# Patient Record
Sex: Male | Born: 1950
Health system: Southern US, Community
[De-identification: ages and names within clinical notes are randomized; demographics above are authoritative.]

## PROBLEM LIST (undated history)

## (undated) DIAGNOSIS — K5900 Constipation, unspecified: Secondary | ICD-10-CM

## (undated) DIAGNOSIS — I480 Paroxysmal atrial fibrillation: Secondary | ICD-10-CM

## (undated) DIAGNOSIS — C801 Malignant (primary) neoplasm, unspecified: Secondary | ICD-10-CM

## (undated) DIAGNOSIS — R0600 Dyspnea, unspecified: Secondary | ICD-10-CM

## (undated) DIAGNOSIS — I455 Other specified heart block: Secondary | ICD-10-CM

## (undated) DIAGNOSIS — T7840XA Allergy, unspecified, initial encounter: Secondary | ICD-10-CM

## (undated) DIAGNOSIS — J449 Chronic obstructive pulmonary disease, unspecified: Secondary | ICD-10-CM

## (undated) DIAGNOSIS — I77819 Aortic ectasia, unspecified site: Secondary | ICD-10-CM

## (undated) DIAGNOSIS — I2510 Atherosclerotic heart disease of native coronary artery without angina pectoris: Secondary | ICD-10-CM

## (undated) DIAGNOSIS — I10 Essential (primary) hypertension: Secondary | ICD-10-CM

## (undated) DIAGNOSIS — E7800 Pure hypercholesterolemia, unspecified: Secondary | ICD-10-CM

## (undated) DIAGNOSIS — I319 Disease of pericardium, unspecified: Secondary | ICD-10-CM

## (undated) DIAGNOSIS — J40 Bronchitis, not specified as acute or chronic: Secondary | ICD-10-CM

## (undated) DIAGNOSIS — D649 Anemia, unspecified: Secondary | ICD-10-CM

## (undated) HISTORY — PX: OTHER SURGICAL HISTORY: SHX169

## (undated) HISTORY — DX: Constipation, unspecified: K59.00

## (undated) HISTORY — PX: POLYPECTOMY: SHX149

## (undated) HISTORY — PX: FRACTURE SURGERY: SHX138

## (undated) HISTORY — PX: COLONOSCOPY: SHX174

## (undated) HISTORY — DX: Allergy, unspecified, initial encounter: T78.40XA

## (undated) HISTORY — DX: Bronchitis, not specified as acute or chronic: J40

---

## 1998-04-26 ENCOUNTER — Ambulatory Visit (HOSPITAL_COMMUNITY): Admission: RE | Admit: 1998-04-26 | Discharge: 1998-04-26 | Payer: Self-pay | Admitting: Family Medicine

## 1999-06-04 ENCOUNTER — Encounter: Payer: Self-pay | Admitting: Emergency Medicine

## 1999-06-04 ENCOUNTER — Emergency Department (HOSPITAL_COMMUNITY): Admission: EM | Admit: 1999-06-04 | Discharge: 1999-06-04 | Payer: Self-pay | Admitting: Emergency Medicine

## 2000-03-15 ENCOUNTER — Encounter: Payer: Self-pay | Admitting: Family Medicine

## 2000-03-15 ENCOUNTER — Encounter: Admission: RE | Admit: 2000-03-15 | Discharge: 2000-03-15 | Payer: Self-pay | Admitting: Family Medicine

## 2004-12-19 ENCOUNTER — Ambulatory Visit: Payer: Self-pay | Admitting: Gastroenterology

## 2004-12-26 ENCOUNTER — Ambulatory Visit: Payer: Self-pay | Admitting: Gastroenterology

## 2009-02-26 ENCOUNTER — Encounter: Admission: RE | Admit: 2009-02-26 | Discharge: 2009-02-26 | Payer: Self-pay | Admitting: Family Medicine

## 2010-03-02 IMAGING — CR DG CHEST 2V
2 series · 2 of 2 positions shown · non-contrast
Comparison: None

CLINICAL DATA: Smoking history

CHEST - 2 VIEW

[view not recorded (1 of 2)]
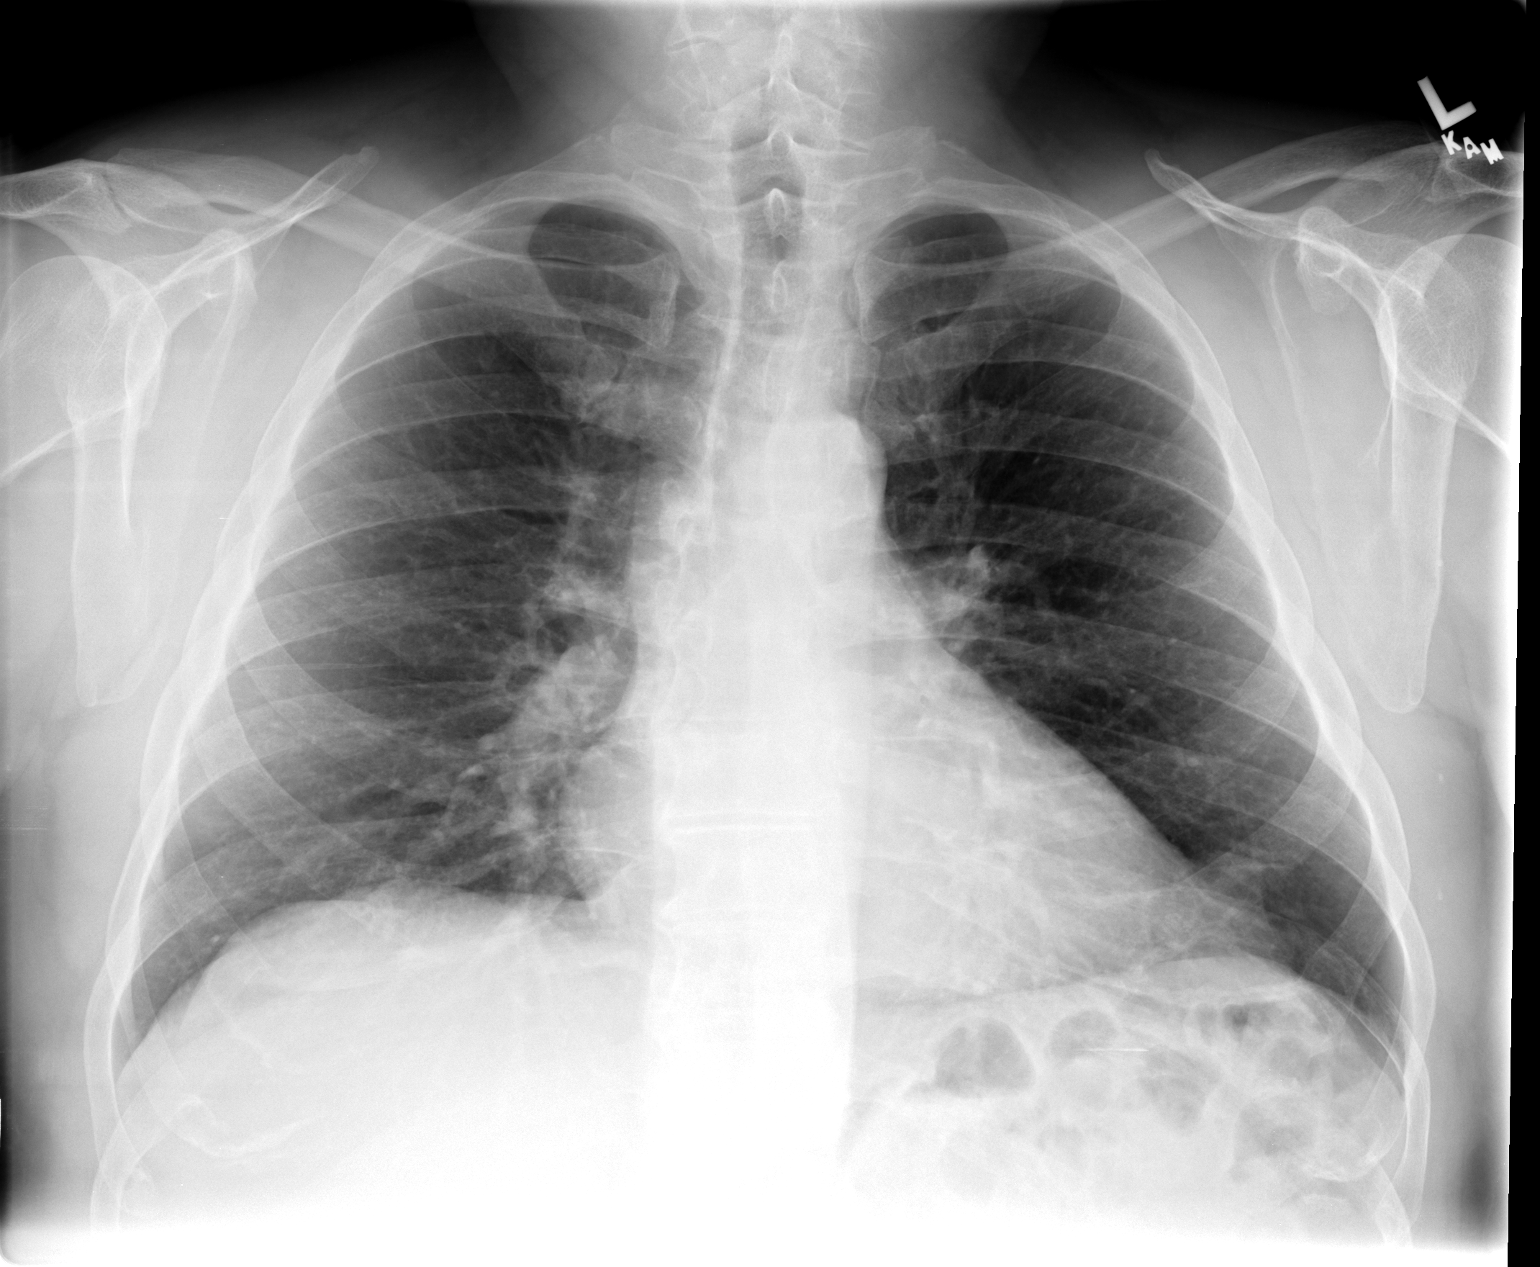

[view not recorded (2 of 2)]
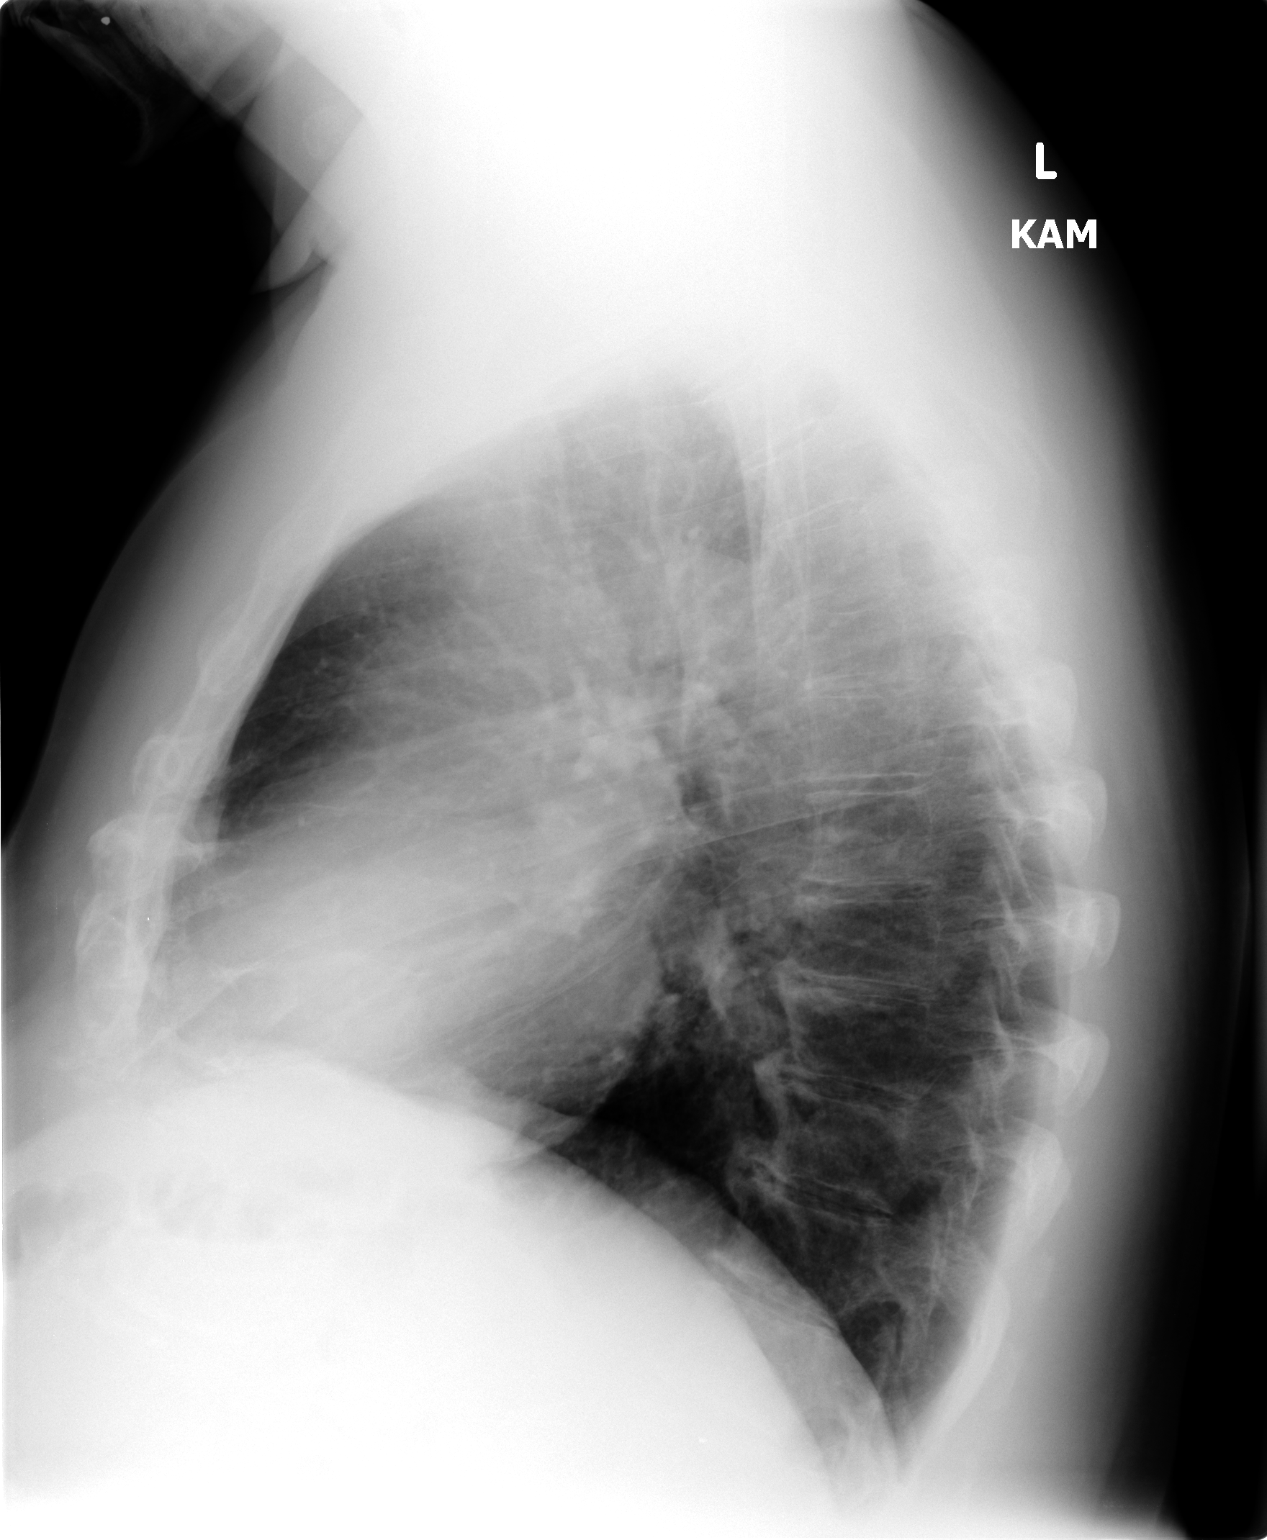

[2 of 2 positions shown; findings below may reference images not displayed]

FINDINGS: No active infiltrate or effusion is seen.  Minimally
prominent perihilar markings are noted, possibly due to smoking
history and mild bronchitis.  The heart is mildly enlarged.  There
are degenerative changes in the thoracic spine.
IMPRESSION: No active lung disease.  Mild peribronchial thickening.

## 2011-12-07 ENCOUNTER — Encounter: Payer: Self-pay | Admitting: Gastroenterology

## 2012-02-15 ENCOUNTER — Encounter: Payer: Self-pay | Admitting: Gastroenterology

## 2012-03-05 ENCOUNTER — Encounter: Payer: Self-pay | Admitting: Gastroenterology

## 2012-05-13 ENCOUNTER — Other Ambulatory Visit: Payer: Self-pay | Admitting: Family Medicine

## 2012-05-13 ENCOUNTER — Ambulatory Visit
Admission: RE | Admit: 2012-05-13 | Discharge: 2012-05-13 | Disposition: A | Discharge status: Home / Self Care | Payer: BC Managed Care – PPO | Source: Ambulatory Visit | Attending: Family Medicine | Admitting: Family Medicine

## 2012-05-13 DIAGNOSIS — R0602 Shortness of breath: Secondary | ICD-10-CM

## 2012-05-19 ENCOUNTER — Observation Stay (HOSPITAL_COMMUNITY)
Admission: EM | Admit: 2012-05-19 | Discharge: 2012-05-21 | Disposition: A | Discharge status: Home / Self Care | Payer: BC Managed Care – PPO | Attending: Internal Medicine | Admitting: Internal Medicine

## 2012-05-19 ENCOUNTER — Encounter (HOSPITAL_COMMUNITY): Payer: Self-pay | Admitting: *Deleted

## 2012-05-19 ENCOUNTER — Emergency Department (HOSPITAL_COMMUNITY): Payer: BC Managed Care – PPO

## 2012-05-19 DIAGNOSIS — Z23 Encounter for immunization: Secondary | ICD-10-CM | POA: Insufficient documentation

## 2012-05-19 DIAGNOSIS — R0602 Shortness of breath: Secondary | ICD-10-CM

## 2012-05-19 DIAGNOSIS — F17200 Nicotine dependence, unspecified, uncomplicated: Secondary | ICD-10-CM | POA: Insufficient documentation

## 2012-05-19 DIAGNOSIS — I10 Essential (primary) hypertension: Secondary | ICD-10-CM

## 2012-05-19 DIAGNOSIS — J9600 Acute respiratory failure, unspecified whether with hypoxia or hypercapnia: Principal | ICD-10-CM | POA: Insufficient documentation

## 2012-05-19 DIAGNOSIS — R059 Cough, unspecified: Secondary | ICD-10-CM

## 2012-05-19 DIAGNOSIS — R05 Cough: Secondary | ICD-10-CM | POA: Diagnosis present

## 2012-05-19 DIAGNOSIS — J441 Chronic obstructive pulmonary disease with (acute) exacerbation: Secondary | ICD-10-CM

## 2012-05-19 DIAGNOSIS — R062 Wheezing: Secondary | ICD-10-CM

## 2012-05-19 DIAGNOSIS — E7800 Pure hypercholesterolemia, unspecified: Secondary | ICD-10-CM | POA: Insufficient documentation

## 2012-05-19 DIAGNOSIS — J449 Chronic obstructive pulmonary disease, unspecified: Secondary | ICD-10-CM | POA: Diagnosis present

## 2012-05-19 HISTORY — DX: Pure hypercholesterolemia, unspecified: E78.00

## 2012-05-19 HISTORY — DX: Chronic obstructive pulmonary disease, unspecified: J44.9

## 2012-05-19 HISTORY — DX: Essential (primary) hypertension: I10

## 2012-05-19 LAB — CBC
HCT: 45.4 % (ref 39.0–52.0)
MCHC: 33.9 g/dL (ref 30.0–36.0)
RDW: 13.6 % (ref 11.5–15.5)
WBC: 16.3 10*3/uL — ABNORMAL HIGH (ref 4.0–10.5)

## 2012-05-19 LAB — BASIC METABOLIC PANEL
BUN: 11 mg/dL (ref 6–23)
Chloride: 101 mEq/L (ref 96–112)
GFR calc Af Amer: >90 mL/min (ref >90)
GFR calc non Af Amer: >90 mL/min (ref >90)
Potassium: 3.9 mEq/L (ref 3.5–5.1)
Sodium: 135 mEq/L (ref 135–145)

## 2012-05-19 MED ORDER — ALBUTEROL SULFATE (5 MG/ML) 0.5% IN NEBU
5.0000 mg | INHALATION_SOLUTION | Freq: Once | RESPIRATORY_TRACT | Status: AC
  Administered 2012-05-19: 5 mg via RESPIRATORY_TRACT
  Filled 2012-05-19: qty 1

## 2012-05-19 MED ORDER — ALBUTEROL SULFATE (5 MG/ML) 0.5% IN NEBU
2.5000 mg | INHALATION_SOLUTION | Freq: Four times a day (QID) | RESPIRATORY_TRACT | Status: DC
  Administered 2012-05-19 – 2012-05-20 (×4): 2.5 mg via RESPIRATORY_TRACT
  Filled 2012-05-19 (×5): qty 0.5

## 2012-05-19 MED ORDER — SODIUM CHLORIDE 0.9 % IJ SOLN
3.0000 mL | Freq: Two times a day (BID) | INTRAMUSCULAR | Status: DC
  Administered 2012-05-20 (×2): 3 mL via INTRAVENOUS

## 2012-05-19 MED ORDER — PROMETHAZINE-CODEINE 6.25-10 MG/5ML PO SYRP
5.0000 mL | ORAL_SOLUTION | Freq: Four times a day (QID) | ORAL | Status: DC | PRN
  Administered 2012-05-19 – 2012-05-20 (×2): 5 mL via ORAL
  Filled 2012-05-19 (×2): qty 5

## 2012-05-19 MED ORDER — DIPHENHYDRAMINE HCL 25 MG PO CAPS
ORAL_CAPSULE | ORAL | Status: AC
  Filled 2012-05-19: qty 1

## 2012-05-19 MED ORDER — ENOXAPARIN SODIUM 40 MG/0.4ML ~~LOC~~ SOLN
40.0000 mg | SUBCUTANEOUS | Status: DC
  Administered 2012-05-19 – 2012-05-20 (×2): 40 mg via SUBCUTANEOUS
  Filled 2012-05-19 (×3): qty 0.4

## 2012-05-19 MED ORDER — SENNOSIDES-DOCUSATE SODIUM 8.6-50 MG PO TABS
1.0000 | ORAL_TABLET | Freq: Every evening | ORAL | Status: DC | PRN
  Administered 2012-05-20: 1 via ORAL
  Filled 2012-05-19 (×2): qty 1

## 2012-05-19 MED ORDER — SODIUM CHLORIDE 0.9 % IV SOLN
250.0000 mL | INTRAVENOUS | Status: DC | PRN
  Administered 2012-05-19: 250 mL via INTRAVENOUS

## 2012-05-19 MED ORDER — SODIUM CHLORIDE 0.9 % IJ SOLN: 3.0000 mL | Freq: Two times a day (BID) | INTRAMUSCULAR | Status: DC

## 2012-05-19 MED ORDER — PNEUMOCOCCAL VAC POLYVALENT 25 MCG/0.5ML IJ INJ
0.5000 mL | INJECTION | INTRAMUSCULAR | Status: AC
  Administered 2012-05-20: 0.5 mL via INTRAMUSCULAR
  Filled 2012-05-19: qty 0.5

## 2012-05-19 MED ORDER — ADULT MULTIVITAMIN W/MINERALS CH
1.0000 | ORAL_TABLET | Freq: Every day | ORAL | Status: DC
  Administered 2012-05-19 – 2012-05-21 (×3): 1 via ORAL
  Filled 2012-05-19 (×3): qty 1

## 2012-05-19 MED ORDER — MOXIFLOXACIN HCL 400 MG PO TABS
400.0000 mg | ORAL_TABLET | Freq: Every day | ORAL | Status: DC
  Administered 2012-05-19 – 2012-05-21 (×3): 400 mg via ORAL
  Filled 2012-05-19 (×3): qty 1

## 2012-05-19 MED ORDER — ALBUTEROL SULFATE (5 MG/ML) 0.5% IN NEBU: 2.5000 mg | INHALATION_SOLUTION | RESPIRATORY_TRACT | Status: DC | PRN

## 2012-05-19 MED ORDER — DIPHENHYDRAMINE HCL 50 MG PO CAPS
ORAL_CAPSULE | ORAL | Status: AC
  Filled 2012-05-19: qty 1

## 2012-05-19 MED ORDER — DIPHENHYDRAMINE HCL 25 MG PO CAPS
25.0000 mg | ORAL_CAPSULE | Freq: Once | ORAL | Status: AC
  Administered 2012-05-19: 25 mg via ORAL

## 2012-05-19 MED ORDER — LISINOPRIL 10 MG PO TABS
10.0000 mg | ORAL_TABLET | Freq: Every day | ORAL | Status: DC
  Administered 2012-05-19 – 2012-05-21 (×3): 10 mg via ORAL
  Filled 2012-05-19 (×3): qty 1

## 2012-05-19 MED ORDER — SODIUM CHLORIDE 0.9 % IJ SOLN: 3.0000 mL | INTRAMUSCULAR | Status: DC | PRN

## 2012-05-19 MED ORDER — METHYLPREDNISOLONE SODIUM SUCC 40 MG IJ SOLR
40.0000 mg | Freq: Two times a day (BID) | INTRAMUSCULAR | Status: DC
  Administered 2012-05-19 – 2012-05-21 (×4): 40 mg via INTRAVENOUS
  Filled 2012-05-19 (×6): qty 1

## 2012-05-19 MED ORDER — ATORVASTATIN CALCIUM 20 MG PO TABS
20.0000 mg | ORAL_TABLET | Freq: Every day | ORAL | Status: DC
  Administered 2012-05-19 – 2012-05-20 (×2): 20 mg via ORAL
  Filled 2012-05-19 (×3): qty 1

## 2012-05-19 MED ORDER — IPRATROPIUM BROMIDE 0.02 % IN SOLN
0.5000 mg | Freq: Four times a day (QID) | RESPIRATORY_TRACT | Status: DC
  Administered 2012-05-19 – 2012-05-20 (×4): 0.5 mg via RESPIRATORY_TRACT
  Filled 2012-05-19 (×5): qty 2.5

## 2012-05-19 MED ORDER — VITAMIN B-1 100 MG PO TABS
100.0000 mg | ORAL_TABLET | Freq: Every day | ORAL | Status: DC
  Administered 2012-05-19 – 2012-05-21 (×3): 100 mg via ORAL
  Filled 2012-05-19 (×3): qty 1

## 2012-05-19 MED ORDER — ONDANSETRON HCL 4 MG PO TABS: 4.0000 mg | ORAL_TABLET | Freq: Four times a day (QID) | ORAL | Status: DC | PRN

## 2012-05-19 MED ORDER — ONDANSETRON HCL 4 MG/2ML IJ SOLN: 4.0000 mg | Freq: Four times a day (QID) | INTRAMUSCULAR | Status: DC | PRN

## 2012-05-19 MED ORDER — VITAMIN E 180 MG (400 UNIT) PO CAPS
400.0000 [IU] | ORAL_CAPSULE | Freq: Every day | ORAL | Status: DC
  Administered 2012-05-19 – 2012-05-21 (×3): 400 [IU] via ORAL
  Filled 2012-05-19 (×3): qty 1

## 2012-05-19 MED ORDER — ASPIRIN 81 MG PO CHEW
81.0000 mg | CHEWABLE_TABLET | Freq: Every day | ORAL | Status: DC
  Administered 2012-05-19 – 2012-05-21 (×3): 81 mg via ORAL
  Filled 2012-05-19 (×3): qty 1

## 2012-05-19 MED ORDER — METHYLPREDNISOLONE SODIUM SUCC 125 MG IJ SOLR
125.0000 mg | Freq: Once | INTRAMUSCULAR | Status: AC
  Administered 2012-05-19: 125 mg via INTRAVENOUS
  Filled 2012-05-19: qty 2

## 2012-05-19 MED ORDER — SODIUM CHLORIDE 0.9 % IV SOLN
INTRAVENOUS | Status: DC
  Administered 2012-05-19: 09:00:00 via INTRAVENOUS

## 2012-05-19 NOTE — H&P (Signed)
Patient's PCP: Darrow Bussing, MD  Chief Complaint: SOB/wheezing  History of Present Illness: Brad [name] is a 61 y.o. white male who Has been sick for over the last week with cough and difficulty breathing, was prescribed antibiotics by his primary care physician and given breathing treatment and a steroid shot with prescription for prednisone taper. He was doing better for a few days and then got worse again. He was evaluated yesterday in the clinic, given another breathing treatment and another steroid shot and started on Avelox this time. He felt better in the clinic, went home and tonight at 3 AM woke up with worsening shortness of breath, wheezing and presents here for evaluation. Started be hypoxic on room air in triage. Patient is a smoker. Symptoms worse with exertion. No chest pain. No fevers. No productive sputum.  Wife has been sick as well.     Meds: Scheduled Meds:    . albuterol  5 mg Nebulization Once  . albuterol  5 mg Nebulization Once  . enoxaparin  40 mg Subcutaneous Q24H  . methylPREDNISolone (SOLU-MEDROL) injection  125 mg Intravenous Once  . sodium chloride  3 mL Intravenous Q12H  . sodium chloride  3 mL Intravenous Q12H   Continuous Infusions:  PRN Meds:.sodium chloride, sodium chloride Allergies: Review of patient's allergies indicates no known allergies. Past Medical History  Diagnosis Date  . COPD (chronic obstructive pulmonary disease)   . Hypertension   . Hypercholesteremia    Past Surgical History  Procedure Date  . Fracture surgery    No family history on file. History   Social History  . Marital Status: Married    Spouse Name: N/A    Number of Children: N/A  . Years of Education: N/A   Occupational History  . Not on file.   Social History Main Topics  . Smoking status: Current Everyday Smoker -- 1.0 packs/day    Types: Cigarettes  . Smokeless tobacco: Not on file  . Alcohol Use: No  . Drug Use:   . Sexually Active:    Other Topics  Concern  . Not on file   Social History Narrative  . No narrative on file   Review of Systems: All systems reviewed with the patient and positive as per history of present illness, otherwise all other systems are negative.   Physical Exam: Blood pressure 148/69, pulse 98, temperature 97.8 F (36.6 C), temperature source Oral, resp. rate 20, SpO2 93.00%. General: Awake, Oriented x3, mild respiratory distress- ruddy complexion HEENT: EOMI, Moist mucous membranes Neck: Supple CV: S1 and S2, rrr Lungs: B/L wheezing Abdomen: Soft, Nontender, Nondistended, +bowel sounds. Ext: Good pulses. Trace edema. No clubbing or cyanosis noted. Neuro: Cranial Nerves II-XII grossly intact. Has 5/5 motor strength in upper and lower extremities.    Lab results:  [facility] 05/19/12 0540  NA 135  K 3.9  CL 101  CO2 23  GLUCOSE 113*  BUN 11  CREATININE 0.70  CALCIUM 9.0  MG --  PHOS --   No results found for this basename: AST:2,ALT:2,ALKPHOS:2,BILITOT:2,PROT:2,ALBUMIN:2 in the last 72 hours No results found for this basename: LIPASE:2,AMYLASE:2 in the last 72 hours  Basename 05/19/12 0540  WBC 16.3*  NEUTROABS --  HGB 15.4  HCT 45.4  MCV 92.8  PLT 195   No results found for this basename: CKTOTAL:3,CKMB:3,CKMBINDEX:3,TROPONINI:3 in the last 72 hours No components found with this basename: POCBNP:3 No results found for this basename: DDIMER in the last 72 hours No results found for this  basename: HGBA1C:2 in the last 72 hours No results found for this basename: CHOL:2,HDL:2,LDLCALC:2,TRIG:2,CHOLHDL:2,LDLDIRECT:2 in the last 72 hours No results found for this basename: TSH,T4TOTAL,FREET3,T3FREE,THYROIDAB in the last 72 hours No results found for this basename: VITAMINB12:2,FOLATE:2,FERRITIN:2,TIBC:2,IRON:2,RETICCTPCT:2 in the last 72 hours Imaging results:  Dg Chest 2 View  05/13/2012  *RADIOLOGY REPORT*  Clinical Data: Shortness of breath.  Cough and history of COPD. Pneumonia several  years ago.  CHEST - 2 VIEW  Comparison: 02/26/2009  Findings: Midline trachea.  Normal heart size and mediastinal contours. No pleural effusion or pneumothorax.  Peribronchial thickening is slightly increased. No lobar consolidation.  IMPRESSION: No lobar consolidation.  Interstitial thickening which is slightly progressive.  Favored to represent chronic bronchitis/the sequelae of smoking.  A component of acute superimposed bronchitis cannot be excluded.  Original Report Authenticated By: Consuello Bossier, M.D.   Dg Chest Portable 1 View  05/19/2012  *RADIOLOGY REPORT*  Clinical Data: Shortness of breath  PORTABLE CHEST - 1 VIEW  Comparison: 05/18/2012 radiograph performed at [facility] @ [facility].  Findings: No interval change.  Peribronchial thickening.  Hazy opacity within the medial right base.  No pneumothorax.  No pleural effusion. Stable cardiomediastinal contours.  Aortic arch atherosclerosis.  No acute osseous change.  Multilevel degenerative change.  IMPRESSION: Bronchitis.  Hazy right lower lobe opacity medially may reflect the same process or superimposed infiltrate.  Original Report Authenticated By: Waneta Martins, M.D.     Assessment & Plan by Problem:   *COPD exacerbation/PNA- nebs, steroids, abx  Acute respiratory failure- O2   SOB (shortness of breath)- O2 PRN   Cough- PRN medications   COPD (chronic obstructive pulmonary disease)   HTN (hypertension)- continue current medications   Time spent on admission, talking to the patient, and coordinating care was: 35 mins.  Srihari Shellhammer, DO 05/19/2012, 8:10 AM

## 2012-05-19 NOTE — ED Notes (Signed)
Pt c/o shortness of breath x 3-4 days. Seen by PCP and rx'd steroids and abx. Started yesterday, worsening starting at 0330 this morning.

## 2012-05-19 NOTE — ED Provider Notes (Signed)
History     CSN: [id number]  Arrival date & time 05/19/12  0454   First MD Initiated Contact with Patient 05/19/12 0505      Chief Complaint  Patient presents with  . Shortness of Breath    (Consider location/radiation/quality/duration/timing/severity/associated sxs/prior treatment) HPI History provided by patient. Has been sick for over the last week with cough and difficulty breathing, was prescribed antibiotics by his primary care physician and given breathing treatment and a steroid shot with prescription for prednisone taper. He was doing better for a few days and then got worse again. He was evaluated yesterday in the clinic, given another breathing treatment and another steroid shot and started on Avelox this time.  He felt better in the clinic, went home and tonight at 3 AM woke up with worsening shortness of breath, wheezing and presents here for evaluation. Started be hypoxic on room air in triage. Patient is a smoker. Symptoms worse with exertion. No chest pain. No fevers. No productive sputum. No sick contacts at home. Symptoms moderate to severe Past Medical History  Diagnosis Date  . COPD (chronic obstructive pulmonary disease)   . Hypertension   . Hypercholesteremia     Past Surgical History  Procedure Date  . Fracture surgery     No family history on file.  History  Substance Use Topics  . Smoking status: Current Everyday Smoker -- 1.0 packs/day    Types: Cigarettes  . Smokeless tobacco: Not on file  . Alcohol Use: No      Review of Systems  Constitutional: Negative for fever and chills.  HENT: Negative for neck pain and neck stiffness.   Eyes: Negative for pain.  Respiratory: Positive for cough, shortness of breath and wheezing.   Cardiovascular: Negative for chest pain.  Gastrointestinal: Negative for abdominal pain.  Genitourinary: Negative for dysuria.  Musculoskeletal: Negative for back pain.  Skin: Negative for rash.  Neurological: Negative for  headaches.  All other systems reviewed and are negative.    Allergies  Review of patient's allergies indicates no known allergies.  Home Medications   Current Outpatient Rx  Name Route Sig Dispense Refill  . ALBUTEROL SULFATE HFA 108 (90 BASE) MCG/ACT IN AERS Inhalation Inhale 2 puffs into the lungs every 6 (six) hours as needed. shortness of breath    . ASPIRIN 81 MG PO CHEW Oral Chew 81 mg by mouth daily.    . CRESTOR 10 MG PO TABS Oral Take 10 mg by mouth daily.     Marland Kitchen GINKOBA PO Oral Take by mouth.    Marland Kitchen LISINOPRIL 10 MG PO TABS Oral Take 10 mg by mouth daily.    Marland Kitchen MOXIFLOXACIN HCL 400 MG PO TABS Oral Take 400 mg by mouth daily.    . ADULT MULTIVITAMIN W/MINERALS CH Oral Take 1 tablet by mouth daily.    Marland Kitchen PREDNISONE 20 MG PO TABS Oral Take 20 mg by mouth daily. Take 3 tablets faily for tw days 2 tablets daily for four days then stop.    Marland Kitchen VITAMIN B-1 100 MG PO TABS Oral Take 100 mg by mouth daily.    Marland Kitchen TIOTROPIUM BROMIDE MONOHYDRATE 18 MCG IN CAPS Inhalation Place 18 mcg into inhaler and inhale daily.    Marland Kitchen VITAMIN E 400 UNITS PO CAPS Oral Take 400 Units by mouth daily.      BP 155/86  Pulse 86  Temp 97.8 F (36.6 C) (Oral)  Resp 24  SpO2 94%  Physical Exam  Constitutional: He  is oriented to person, place, and time. He appears well-developed and well-nourished.  HENT:  Head: Normocephalic and atraumatic.  Eyes: Conjunctivae and EOM are normal. Pupils are equal, round, and reactive to light.  Neck: Trachea normal. Neck supple. No thyromegaly present.  Cardiovascular: Normal rate, regular rhythm, S1 normal, S2 normal and normal pulses.     No systolic murmur is present   No diastolic murmur is present  Pulses:      Radial pulses are 2+ on the right side, and 2+ on the left side.  Pulmonary/Chest: He has no rhonchi. He exhibits no tenderness.       Mild respiratory distress with tachypnea. Decreased bilateral breath sounds with prolonged expirations and bilateral expiratory  wheezes  Abdominal: Soft. Normal appearance and bowel sounds are normal. There is no tenderness. There is no CVA tenderness and negative Murphy's sign.  Musculoskeletal:       BLE:s Calves nontender, no cords or erythema, negative Homans sign  Neurological: He is alert and oriented to person, place, and time. He has normal strength. No cranial nerve deficit or sensory deficit. GCS eye subscore is 4. GCS verbal subscore is 5. GCS motor subscore is 6.  Skin: Skin is warm and dry. No rash noted. He is not diaphoretic.  Psychiatric: His speech is normal.       Cooperative and appropriate    ED Course  Procedures (including critical care time)  Results for orders placed during the hospital encounter of 05/19/12  CBC      Component Value Range   WBC 16.3 (*) 4.0 - 10.5 K/uL   RBC 4.89  4.22 - 5.81 MIL/uL   Hemoglobin 15.4  13.0 - 17.0 g/dL   HCT 14.7  82.9 - 56.2 %   MCV 92.8  78.0 - 100.0 fL   MCH 31.5  26.0 - 34.0 pg   MCHC 33.9  30.0 - 36.0 g/dL   RDW 13.0  86.5 - 78.4 %   Platelets 195  150 - 400 K/uL  BASIC METABOLIC PANEL      Component Value Range   Sodium 135  135 - 145 mEq/L   Potassium 3.9  3.5 - 5.1 mEq/L   Chloride 101  96 - 112 mEq/L   CO2 23  19 - 32 mEq/L   Glucose, Bld 113 (*) 70 - 99 mg/dL   BUN 11  6 - 23 mg/dL   Creatinine, Ser 6.96  0.50 - 1.35 mg/dL   Calcium 9.0  8.4 - 29.5 mg/dL   GFR calc non Af Amer >90  >90 mL/min   GFR calc Af Amer >90  >90 mL/min   Dg Chest 2 View   Dg Chest Portable 1 View  05/19/2012  *RADIOLOGY REPORT*  Clinical Data: Shortness of breath  PORTABLE CHEST - 1 VIEW  Comparison: 05/18/2012 radiograph performed at [facility] @ [facility].  Findings: No interval change.  Peribronchial thickening.  Hazy opacity within the medial right base.  No pneumothorax.  No pleural effusion. Stable cardiomediastinal contours.  Aortic arch atherosclerosis.  No acute osseous change.  Multilevel degenerative change.  IMPRESSION:  Bronchitis.  Hazy right lower lobe opacity medially may reflect the same process or superimposed infiltrate.  Original Report Authenticated By: Waneta Martins, M.D.   Hypoxic on room air requiring oxygen, improved with 2 L nasal cannula   Date: 05/19/2012  Rate: 90  Rhythm: normal sinus rhythm  QRS Axis: normal  Intervals: normal  ST/T Wave abnormalities:  nonspecific ST changes  Conduction Disutrbances:none  Narrative Interpretation:   Old EKG Reviewed: none available  IV Solu-Medrol. Albuterol treatment provided. On recheck minimal improvement with continued oxygen requirement.  Labs and x-ray obtained and reviewed as above. IV Avelox initiated. Medicine consult for admission.  6:44 AM case discussed as above with Dr. Houston Siren who agrees to admission. MDM   Hypoxia, bronchitis and right lower lobe infiltrate. IV antibiotics. Continued breathing treatments provided. Plan medical admission.         Sunnie Nielsen, MD 05/19/12 [phone]

## 2012-05-19 NOTE — ED Notes (Signed)
Report called to [facility] and pt transported on stretcher, monitored with O2 at 3L to Rm 1417

## 2012-05-20 DIAGNOSIS — I10 Essential (primary) hypertension: Secondary | ICD-10-CM

## 2012-05-20 DIAGNOSIS — R059 Cough, unspecified: Secondary | ICD-10-CM

## 2012-05-20 DIAGNOSIS — J441 Chronic obstructive pulmonary disease with (acute) exacerbation: Secondary | ICD-10-CM

## 2012-05-20 DIAGNOSIS — R0602 Shortness of breath: Secondary | ICD-10-CM

## 2012-05-20 DIAGNOSIS — R05 Cough: Secondary | ICD-10-CM

## 2012-05-20 LAB — BASIC METABOLIC PANEL
CO2: 25 mEq/L (ref 19–32)
GFR calc non Af Amer: >90 mL/min (ref >90)
Glucose, Bld: 149 mg/dL — ABNORMAL HIGH (ref 70–99)
Potassium: 4.3 mEq/L (ref 3.5–5.1)
Sodium: 138 mEq/L (ref 135–145)

## 2012-05-20 LAB — CBC
Hemoglobin: 14.9 g/dL (ref 13.0–17.0)
MCH: 31 pg (ref 26.0–34.0)
MCV: 94.4 fL (ref 78.0–100.0)
Platelets: 210 10*3/uL (ref 150–400)
RBC: 4.8 MIL/uL (ref 4.22–5.81)
WBC: 14.5 10*3/uL — ABNORMAL HIGH (ref 4.0–10.5)

## 2012-05-20 MED ORDER — IPRATROPIUM BROMIDE 0.02 % IN SOLN
0.5000 mg | RESPIRATORY_TRACT | Status: DC
  Administered 2012-05-20 – 2012-05-21 (×6): 0.5 mg via RESPIRATORY_TRACT
  Filled 2012-05-20 (×6): qty 2.5

## 2012-05-20 MED ORDER — ALBUTEROL SULFATE (5 MG/ML) 0.5% IN NEBU
2.5000 mg | INHALATION_SOLUTION | RESPIRATORY_TRACT | Status: DC
  Administered 2012-05-20 – 2012-05-21 (×6): 2.5 mg via RESPIRATORY_TRACT
  Filled 2012-05-20 (×6): qty 0.5

## 2012-05-20 MED ORDER — ACETAMINOPHEN 325 MG PO TABS: 650.0000 mg | ORAL_TABLET | Freq: Four times a day (QID) | ORAL | Status: DC | PRN

## 2012-05-20 NOTE — Progress Notes (Signed)
Pt was walked on RA without 02 and his sats dropped to 88%. While on 02 @ 2L/min [place] his sats are 91%. Will continue to monitor and leave 02 on for now.

## 2012-05-20 NOTE — Progress Notes (Signed)
[facility] PROGRESS NOTE  Brad [name]:[id number] DOB: August 16, 1951 DOA: 05/19/2012 PCP: Darrow Bussing, MD  Brief narrative: Pleasant 61 yo hx COPD, HTN admitted 05/19/12 with COPD exacerbation and acute resp. Failure having failed OP treatment. Given 02, solumedrol and nebs with antibiotic. Improving  Consultants:    Procedures:    Antibiotics:  Avelox day #2.   HPI/Subjective: Sitting up in bed watching TV. Reports "breathing much better". Denies CP/discomfort. NAD  Objective: Filed Vitals:   05/19/12 1539 05/19/12 2126 05/20/12 0237 05/20/12 0801  BP:      Pulse:  85    Temp:      TempSrc:      Resp:  20 20   Height:      Weight:      SpO2: 97% 95%  94%    Intake/Output Summary (Last 24 hours) at 05/20/12 0852 Last data filed at 05/20/12 0600  Gross per 24 hour  Intake 1796.25 ml  Output    925 ml  Net 871.25 ml    Exam:   General:  Awake alert oriented x3  Cardiovascular: RRR, No MGR, trace LEE, PPP  Respiratory: mild increased work of breathing with conversation. Breath sounds tight, diminished in bases, diffuse inspiratory and expiratory wheezes, mild rhonchi.   Abdomen: obese, soft, +BS, non-tender to palp  Data Reviewed: Basic Metabolic Panel:  Lab 05/20/12 6295 05/19/12 0540  NA 138 135  K 4.3 3.9  CL 104 101  CO2 25 23  GLUCOSE 149* 113*  BUN 13 11  CREATININE 0.75 0.70  CALCIUM 9.1 9.0  MG -- --  PHOS -- --   Liver Function Tests: No results found for this basename: AST:5,ALT:5,ALKPHOS:5,BILITOT:5,PROT:5,ALBUMIN:5 in the last 168 hours No results found for this basename: LIPASE:5,AMYLASE:5 in the last 168 hours No results found for this basename: AMMONIA:5 in the last 168 hours CBC:  Lab 05/20/12 0418 05/19/12 0540  WBC 14.5* 16.3*  NEUTROABS -- --  HGB 14.9 15.4  HCT 45.3 45.4  MCV 94.4 92.8  PLT 210 195   Cardiac Enzymes: No results found for this basename: CKTOTAL:5,CKMB:5,CKMBINDEX:5,TROPONINI:5 in the last  168 hours BNP: No components found with this basename: POCBNP:5 CBG: No results found for this basename: GLUCAP:5 in the last 168 hours  No results found for this or any previous visit (from the past 240 hour(s)).   Studies: Dg Chest Portable 1 View  05/19/2012  *RADIOLOGY REPORT*  Clinical Data: Shortness of breath  PORTABLE CHEST - 1 VIEW  Comparison: 05/18/2012 radiograph performed at [facility] @ [facility].  Findings: No interval change.  Peribronchial thickening.  Hazy opacity within the medial right base.  No pneumothorax.  No pleural effusion. Stable cardiomediastinal contours.  Aortic arch atherosclerosis.  No acute osseous change.  Multilevel degenerative change.  IMPRESSION: Bronchitis.  Hazy right lower lobe opacity medially may reflect the same process or superimposed infiltrate.  Original Report Authenticated By: Waneta Martins, M.D.    Scheduled Meds:   . albuterol  2.5 mg Nebulization Q4H  . aspirin  81 mg Oral Daily  . atorvastatin  20 mg Oral q1800  . diphenhydrAMINE      . diphenhydrAMINE  25 mg Oral Once  . enoxaparin  40 mg Subcutaneous Q24H  . ipratropium  0.5 mg Nebulization Q4H  . lisinopril  10 mg Oral Daily  . methylPREDNISolone (SOLU-MEDROL) injection  40 mg Intravenous Q12H  . moxifloxacin  400 mg Oral Daily  . multivitamin with minerals  1  tablet Oral Daily  . pneumococcal 23 valent vaccine  0.5 mL Intramuscular Tomorrow-1000  . sodium chloride  3 mL Intravenous Q12H  . thiamine  100 mg Oral Daily  . vitamin E  400 Units Oral Daily  . DISCONTD: albuterol  2.5 mg Nebulization Q6H  . DISCONTD: ipratropium  0.5 mg Nebulization Q6H  . DISCONTD: sodium chloride  3 mL Intravenous Q12H  . DISCONTD: sodium chloride  3 mL Intravenous Q12H   Continuous Infusions:   . DISCONTD: sodium chloride 75 mL/hr at 05/20/12 0600     Assessment/Plan:.  *COPD exacerbation/PNA- some improvement but remains sob with coarse BS/wheezes. Will continue   Nebs q 4hr,  Will keep steroids IV 1 more day then change to po taper, continue  abx #2.   Acute respiratory failure- Improved. Sats remain 92-97 on 3-4L O2 . Will ambulate and check sats and attempt to wean 02 if appropriate  SOB (shortness of breath)- O2 PRN. See #1&#2.   Cough- PRN medications . Improved.  COPD (chronic obstructive pulmonary disease) See #1.  HTN (hypertension)- Fair control.  Continue current medications and monitor     Code Status: full Family Communication:  Disposition Plan:  Home when medically ready likely in am.    Gwenyth Bender, MD  [facility] Pager [phone]  If 7PM-7AM, please contact night-coverage [email or web address] Password [email or web address] 05/20/2012, 8:52 AM   LOS: 1 day

## 2012-05-20 NOTE — Progress Notes (Signed)
Patient seen and examined by me.  Agree with plan by [name].  Still with some dec BS b/L. Continue IV steroids/PO abx/nebs and recheck O2 in AM.  Will hopefully not require O2 at discharge.  Marlin Canary

## 2012-05-21 LAB — CBC
HCT: 45.2 % (ref 39.0–52.0)
Hemoglobin: 14.6 g/dL (ref 13.0–17.0)
MCV: 95.2 fL (ref 78.0–100.0)
RDW: 13.6 % (ref 11.5–15.5)
WBC: 13.7 10*3/uL — ABNORMAL HIGH (ref 4.0–10.5)

## 2012-05-21 MED ORDER — MOXIFLOXACIN HCL 400 MG PO TABS: 400.0000 mg | ORAL_TABLET | Freq: Every day | ORAL | Status: DC

## 2012-05-21 MED ORDER — PREDNISONE 10 MG PO TABS: ORAL_TABLET | ORAL | Status: DC

## 2012-05-21 MED ORDER — MOXIFLOXACIN HCL 400 MG PO TABS: ORAL_TABLET | ORAL | Status: DC

## 2012-05-21 NOTE — Discharge Summary (Signed)
Patient seen and examined by me.  Agree with plan by [name].  Failed outpatient treatment of COPD exacerbation- better here.  Encouraged tobacco cessation.  Hope on abx and steroid taper.  Not requiring O2.  Marlin Canary DO

## 2012-05-21 NOTE — Discharge Instructions (Addendum)
Bronchitis Bronchitis is a problem of the air tubes leading to your lungs. This problem makes it hard for air to get in and out of the lungs. You may cough a lot because your air tubes are narrow. Going without care can cause lasting (chronic) bronchitis. HOME CARE   Drink enough fluids to keep your pee (urine) clear or pale yellow.   Use a cool mist humidifier.   Quit smoking if you smoke. If you keep smoking, the bronchitis might not get better.   Only take medicine as told by your doctor.  GET HELP RIGHT AWAY IF:   Coughing keeps you awake.   You start to wheeze.   You become more sick or weak.   You have a hard time breathing or get short of breath.   You cough up blood.   Coughing lasts more than 2 weeks.   You have a fever.   Your baby is older than 3 months with a rectal temperature of 102 F (38.9 C) or higher.   Your baby is 40 months old or younger with a rectal temperature of 100.4 F (38 C) or higher.  MAKE SURE YOU:  Understand these instructions.   Will watch your condition.   Will get help right away if you are not doing well or get worse.  Document Released: 04/24/2008 Document Revised: 10/26/2011 Document Reviewed: 10/08/2009 [facility] Patient Information 2012 [place], [place].Chronic Obstructive Pulmonary Disease Chronic obstructive pulmonary disease (COPD) is a lung disease. The lungs become damaged, making it hard to get air in and out of your lungs. The damage to your lungs cannot be changed.  HOME CARE  Stop smoking if you smoke. Avoid secondhand smoke.   Only take medicine as told by your doctor.   Talk to your doctor about using cough syrup or over-the-counter medicines.   Drink enough fluids to keep your pee (urine) clear or pale yellow.   Use a humidifier or vaporizer. This may help loosen the thick spit (mucus).   Talk to your doctor about vaccines that help prevent other lung problems (pneumonia and flu vaccines).   Use home oxygen as  told by your doctor.   Stay active and exercise.   Eat healthy foods.  GET HELP RIGHT AWAY IF:   Your heart is beating fast.   You become disturbed, confused, shake, or are dazed.   You have trouble breathing.   You have chest pain.   You have a fever.   You cough up thick spit that is yellowish-white or green.   Your breathing becomes worse when you exercise.   You are running out of the medicine you take for your breathing.  MAKE SURE YOU:   Understand these instructions.   Will watch your condition.   Will get help right away if you are not doing well or get worse.  Document Released: 04/24/2008 Document Revised: 10/26/2011 Document Reviewed: 01/06/2011 [facility] Patient Information 2012 [place], [place].

## 2012-05-21 NOTE — Progress Notes (Signed)
Patients oxygen saturation was 93% on room air at rest. On ambulation, patients oxygen saturation was 92% on room air. Patient did not complain of shortness of breath and tolerated well. Will continue to monitor patient.

## 2012-05-21 NOTE — Progress Notes (Signed)
On room air at rest, patients oxygen saturation was 95%. On ambulation >100 feet, patients saturation was 93% on room air. Patient did not complain of shortness of breath. Patient tolerated well. Will continue to monitor patient. Setzer, Don Broach

## 2012-05-21 NOTE — Discharge Summary (Signed)
Physician Discharge Summary  Patient ID: Brad [name] MRN: [id number] DOB/AGE: 62-10-1951 62 y.o.  Admit date: 05/19/2012 Discharge date: 05/21/2012  Primary Care Physician:  Darrow Bussing, MD   Discharge Diagnoses:    Principal Problem:  *COPD exacerbation Active Problems:  SOB (shortness of breath)  Cough  COPD (chronic obstructive pulmonary disease)  HTN (hypertension)   Medication List  As of 05/21/2012  8:15 AM   TAKE these medications         albuterol 108 (90 BASE) MCG/ACT inhaler   Commonly known as: PROVENTIL HFA;VENTOLIN HFA   Inhale 2 puffs into the lungs every 6 (six) hours as needed. shortness of breath      aspirin 81 MG chewable tablet   Chew 81 mg by mouth daily.      CRESTOR 10 MG tablet   Generic drug: rosuvastatin   Take 10 mg by mouth daily.      GINKOBA PO   Take by mouth.      lisinopril 10 MG tablet   Commonly known as: PRINIVIL,ZESTRIL   Take 10 mg by mouth daily.      moxifloxacin 400 MG tablet   Commonly known as: AVELOX   Last dose 05/23/12      multivitamin with minerals Tabs   Take 1 tablet by mouth daily.      predniSONE 10 MG tablet   Commonly known as: DELTASONE   Take 6 tabs 7/3, take 4 tabs 7/4, take 2 tabs 7/5 take 1 tab 7/6 then stop      thiamine 100 MG tablet   Commonly known as: VITAMIN B-1   Take 100 mg by mouth daily.      tiotropium 18 MCG inhalation capsule   Commonly known as: SPIRIVA   Place 18 mcg into inhaler and inhale daily.      vitamin E 400 UNIT capsule   Take 400 Units by mouth daily.             Disposition and Follow-up: Pt medically stable and ready for discharge to home.   Follow-up Information    Follow up with Darrow Bussing, MD. Schedule an appointment as soon as possible for a visit in 1 week.   Contact information:   [address] [place] [address] [phone]          Consults:  None  Physical exam: General: Awake alert oriented x3 Sitting up in chair.  NAD Cardiovascular: RRR, No MGR, trace LEE, PPP  Respiratory: Normal effort. BS diminished in bilateral bases, but clear otherwise. No wheeze, rhonchi  Abdomen: obese, soft, +BS, non-tender to palp      Significant Diagnostic Studies:  Dg Chest Portable 1 View  05/19/2012  *RADIOLOGY REPORT*  Clinical Data: Shortness of breath  PORTABLE CHEST - 1 VIEW  Comparison: 05/18/2012 radiograph performed at [facility] @ [facility].  Findings: No interval change.  Peribronchial thickening.  Hazy opacity within the medial right base.  No pneumothorax.  No pleural effusion. Stable cardiomediastinal contours.  Aortic arch atherosclerosis.  No acute osseous change.  Multilevel degenerative change.  IMPRESSION: Bronchitis.  Hazy right lower lobe opacity medially may reflect the same process or superimposed infiltrate.  Original Report Authenticated By: Waneta Martins, M.D.    Labs Reviewed  CBC - Abnormal; Notable for the following:    WBC 16.3 (*)     All other components within normal limits  BASIC METABOLIC PANEL - Abnormal; Notable for the following:  Glucose, Bld 113 (*)     All other components within normal limits  BASIC METABOLIC PANEL - Abnormal; Notable for the following:    Glucose, Bld 149 (*)     All other components within normal limits  CBC - Abnormal; Notable for the following:    WBC 14.5 (*)     All other components within normal limits  CBC - Abnormal; Notable for the following:    WBC 13.7 (*)     All other components within normal limits        Dg Chest 2 View  05/13/2012  *RADIOLOGY REPORT*  Clinical Data: Shortness of breath.  Cough and history of COPD. Pneumonia several years ago.  CHEST - 2 VIEW  Comparison: 02/26/2009  Findings: Midline trachea.  Normal heart size and mediastinal contours. No pleural effusion or pneumothorax.  Peribronchial thickening is slightly increased. No lobar consolidation.  IMPRESSION: No lobar consolidation.  Interstitial  thickening which is slightly progressive.  Favored to represent chronic bronchitis/the sequelae of smoking.  A component of acute superimposed bronchitis cannot be excluded.  Original Report Authenticated By: Consuello Bossier, M.D.   Dg Chest Portable 1 View  05/19/2012  *RADIOLOGY REPORT*  Clinical Data: Shortness of breath  PORTABLE CHEST - 1 VIEW  Comparison: 05/18/2012 radiograph performed at [facility] @ [facility].  Findings: No interval change.  Peribronchial thickening.  Hazy opacity within the medial right base.  No pneumothorax.  No pleural effusion. Stable cardiomediastinal contours.  Aortic arch atherosclerosis.  No acute osseous change.  Multilevel degenerative change.  IMPRESSION: Bronchitis.  Hazy right lower lobe opacity medially may reflect the same process or superimposed infiltrate.  Original Report Authenticated By: Waneta Martins, M.D.       Brief H and P: For complete details please refer to admission H and P, but in brief  Brad [name] is a 61 y.o. white male who Has been sick for over a week with cough and difficulty breathing, was prescribed antibiotics by his primary care physician and given breathing treatment and a steroid shot with prescription for prednisone taper. He was doing better for a few days and then got worse again. He was evaluated 6/29 in the clinic, given another breathing treatment and another steroid shot and started on Avelox this time. He felt better in the clinic, went home and on 05/19/12 at 3 AM woke up with worsening shortness of breath, wheezing and presented to ED for evaluation.  In ED he was hypoxic on room air in triage. Patient is a smoker. Symptoms worse with exertion. No chest pain. No fevers. No productive sputum. Wife has been sick as well.  Was admitted by hospitalist      Hospital Course:   Principal Problem:  *COPD exacerbation Active Problems:  SOB (shortness of breath)  Cough  COPD (chronic obstructive  pulmonary disease)  HTN (hypertension)  COPD exacerbation/PNA-  Pt admitted to tele. Had some improvement 05/20/12 but remained sob with coarse BS/wheezes. Continued Nebs q 4hr,and IV  Steroids as well as avelox. On day of discharge, much improved. Wheeze resolved. Will discharge on prednisone taper and 2 days of avelox to complete 7 days including doses taken before admission.    Acute respiratory failure-  On 05/20/12 sats 88% on RA with exertion. He initially required 3-4L 02 to keep sats >90. On day of discharge on room air at rest, patients oxygen saturation was 95%. On ambulation >100 feet, patients saturation was 93% on  room air. Patient did not complain of shortness of breath. Patient tolerated well. Will follow up with PCP 1 week for recheck of sat level. May benefit from OP pulmonary function testing.    SOB (shortness of breath)- O2 PRN. See #1&#2.   Cough- PRN medications . Improved.   COPD (chronic obstructive pulmonary disease) See #1. Continue home meds  HTN (hypertension)-  Controlled. Continue home meds.    Time spent on Discharge: 30 min  Signed: Gwenyth Bender 05/21/2012, 8:15 AM

## 2012-05-21 NOTE — Care Management Note (Signed)
    Page 1 of 1   05/21/2012     12:27:16 PM   CARE MANAGEMENT NOTE 05/21/2012  Patient:  Brad [name], Brad [name]   Account Number:  [id number]  Date Initiated:  05/21/2012  Documentation initiated by:  [facility]  Subjective/Objective Assessment:   ADMITTED W/SOB.     Action/Plan:   FROM HOME   Anticipated DC Date:  05/21/2012   Anticipated DC Plan:  HOME/SELF CARE      DC Planning Services  CM consult      Choice offered to / List presented to:             Status of service:  Completed, signed off Medicare Important Message given?   (If response is "NO", the following Medicare IM given date fields will be blank) Date Medicare IM given:   Date Additional Medicare IM given:    Discharge Disposition:  HOME/SELF CARE  Per UR Regulation:  Reviewed for med. necessity/level of care/duration of stay  If discussed at Long Length of Stay Meetings, dates discussed:    Comments:

## 2012-06-13 ENCOUNTER — Ambulatory Visit (INDEPENDENT_AMBULATORY_CARE_PROVIDER_SITE_OTHER): Payer: BC Managed Care – PPO | Admitting: Internal Medicine

## 2012-06-13 DIAGNOSIS — J449 Chronic obstructive pulmonary disease, unspecified: Secondary | ICD-10-CM

## 2012-06-13 DIAGNOSIS — J4489 Other specified chronic obstructive pulmonary disease: Secondary | ICD-10-CM

## 2012-06-13 LAB — PULMONARY FUNCTION TEST

## 2012-06-13 NOTE — Progress Notes (Signed)
PFT done today. 

## 2014-06-09 ENCOUNTER — Ambulatory Visit
Admission: RE | Admit: 2014-06-09 | Discharge: 2014-06-09 | Disposition: A | Discharge status: Home / Self Care | Payer: BC Managed Care – PPO | Source: Ambulatory Visit | Attending: Family Medicine | Admitting: Family Medicine

## 2014-06-09 ENCOUNTER — Other Ambulatory Visit: Payer: Self-pay | Admitting: Family Medicine

## 2014-06-09 DIAGNOSIS — J441 Chronic obstructive pulmonary disease with (acute) exacerbation: Secondary | ICD-10-CM

## 2014-12-16 ENCOUNTER — Encounter: Payer: Self-pay | Admitting: Gastroenterology

## 2015-01-12 ENCOUNTER — Ambulatory Visit (AMBULATORY_SURGERY_CENTER): Payer: Self-pay

## 2015-01-12 VITALS — Ht 68.0 in | Wt 231.2 lb

## 2015-01-12 DIAGNOSIS — Z86010 Personal history of colonic polyps: Secondary | ICD-10-CM

## 2015-01-12 MED ORDER — NA SULFATE-K SULFATE-MG SULF 17.5-3.13-1.6 GM/177ML PO SOLN: ORAL | Status: DC

## 2015-01-12 NOTE — Progress Notes (Signed)
Per pt, no allergies to soy or egg products.Pt not taking any weight loss meds or using  O2 at home. 

## 2015-01-26 ENCOUNTER — Ambulatory Visit (AMBULATORY_SURGERY_CENTER): Payer: BLUE CROSS/BLUE SHIELD | Admitting: Gastroenterology

## 2015-01-26 ENCOUNTER — Encounter: Payer: Self-pay | Admitting: Gastroenterology

## 2015-01-26 VITALS — BP 137/63 | HR 80 | Temp 98.2°F | Resp 20 | Ht 68.0 in | Wt 231.0 lb

## 2015-01-26 DIAGNOSIS — D120 Benign neoplasm of cecum: Secondary | ICD-10-CM | POA: Diagnosis not present

## 2015-01-26 DIAGNOSIS — D123 Benign neoplasm of transverse colon: Secondary | ICD-10-CM

## 2015-01-26 DIAGNOSIS — Z86010 Personal history of colonic polyps: Secondary | ICD-10-CM

## 2015-01-26 DIAGNOSIS — D124 Benign neoplasm of descending colon: Secondary | ICD-10-CM

## 2015-01-26 DIAGNOSIS — K5730 Diverticulosis of large intestine without perforation or abscess without bleeding: Secondary | ICD-10-CM

## 2015-01-26 MED ORDER — FLEET ENEMA 7-19 GM/118ML RE ENEM
1.0000 | ENEMA | Freq: Once | RECTAL | Status: AC
  Administered 2015-01-26: 1 via RECTAL

## 2015-01-26 MED ORDER — SODIUM CHLORIDE 0.9 % IV SOLN: 500.0000 mL | INTRAVENOUS | Status: DC

## 2015-01-26 NOTE — Progress Notes (Signed)
Called to room to assist during endoscopic procedure.  Patient ID and intended procedure confirmed with present staff. Received instructions for my participation in the procedure from the performing physician.  

## 2015-01-26 NOTE — Op Note (Signed)
[facility]  [address] [place], [id number]   COLONOSCOPY PROCEDURE REPORT  PATIENT: Brad [name], Brad [name]  MR#: [id number] BIRTHDATE: 02-23-1951 , 13  yrs. old GENDER: male ENDOSCOPIST: Inda Castle, MD REFERRED BY: PROCEDURE DATE:  01/26/2015 PROCEDURE:   Colonoscopy with snare polypectomy and Colonoscopy, screening First Screening Colonoscopy - Avg.  risk and is 50 yrs.  old or older - No.  Prior Negative Screening - Now for repeat screening. 10 or more years since last screening  History of Adenoma - Now for follow-up colonoscopy & has been > or = to 3 yrs.  N/A  Polyps Removed Today? Yes. ASA CLASS:   Class II INDICATIONS:Average Risk. MEDICATIONS: Monitored anesthesia care and Propofol 500 mg IV  DESCRIPTION OF PROCEDURE:   After the risks benefits and alternatives of the procedure were thoroughly explained, informed consent was obtained.  The digital rectal exam revealed no abnormalities of the rectum.   The [id number]  endoscope was introduced through the anus and advanced to the cecum, which was identified by both the appendix and ileocecal valve. No adverse events experienced.   The quality of the prep was (Suprep was used) good.  The instrument was then slowly withdrawn as the colon was fully examined.      COLON FINDINGS: There was moderate diverticulosis noted in the descending colon.   A sessile polyp measuring 15 mm in size was found in the transverse colon.  A polypectomy was performed with a cold snare.  The resection was complete, the polyp tissue was completely retrieved and sent to histology.   A sessile polyp measuring 2 mm in size was found in the transverse colon.  A polypectomy was performed with cold forceps.   A sessile polyp measuring 18 mm in size was found at the cecum.  A polypectomy was performed using snare cautery.  The resection was complete, the polyp tissue was completely retrieved and sent to histology.    A sessile polyp measuring 15 mm in size was found at the splenic flexure.  A polypectomy was performed with a cold snare.  The resection was complete, the polyp tissue was completely retrieved and sent to histology.   A polypoid shaped sessile polyp measuring 18 mm in size was found at the cecum.  A polypectomy was performed using snare cautery.  The resection was complete, the polyp tissue was completely retrieved and sent to histology.  Retroflexed views revealed no abnormalities. The time to cecum = 6.5 Withdrawal time = 34.4   The scope was withdrawn and the procedure completed. COMPLICATIONS: There were no immediate complications.  ENDOSCOPIC IMPRESSION: 1.  colonic polyposis 2.  Diverticulosis  RECOMMENDATIONS: 1.  Colonoscopy 3 years 2.  Hold Aspirin and all other NSAIDS for 2 weeks.  eSigned:  Inda Castle, MD 01/26/2015 3:48 PM   cc: Lujean Amel, MD   PATIENT NAME:  Brad [name], Brad [name] MR#: [id number]

## 2015-01-26 NOTE — Progress Notes (Signed)
Pt asked if he is ok to stay on ASA 81 mg daily; ok per Dr. Deatra Ina

## 2015-01-26 NOTE — Patient Instructions (Addendum)
YOU HAD AN ENDOSCOPIC PROCEDURE TODAY AT [facility]:   Refer to the procedure report that was given to you for any specific questions about what was found during the examination.  If the procedure report does not answer your questions, please call your gastroenterologist to clarify.  If you requested that your care partner not be given the details of your procedure findings, then the procedure report has been included in a sealed envelope for you to review at your convenience later.  YOU SHOULD EXPECT: Some feelings of bloating in the abdomen. Passage of more gas than usual.  Walking can help get rid of the air that was put into your GI tract during the procedure and reduce the bloating. If you had a lower endoscopy (such as a colonoscopy or flexible sigmoidoscopy) you may notice spotting of blood in your stool or on the toilet paper. If you underwent a bowel prep for your procedure, you may not have a normal bowel movement for a few days.  Please Note:  You might notice some irritation and congestion in your nose or some drainage.  This is from the oxygen used during your procedure.  There is no need for concern and it should clear up in a day or so.  SYMPTOMS TO REPORT IMMEDIATELY:   Following lower endoscopy (colonoscopy or flexible sigmoidoscopy):  Excessive amounts of blood in the stool  Significant tenderness or worsening of abdominal pains  Swelling of the abdomen that is new, acute  Fever of 100F or higher  For urgent or emergent issues, a gastroenterologist can be reached at any hour by calling [phone].   DIET: Your first meal following the procedure should be a small meal and then it is ok to progress to your normal diet. Heavy or fried foods are harder to digest and may make you feel nauseous or bloated.  Likewise, meals heavy in dairy and vegetables can increase bloating.  Drink plenty of fluids but you should avoid alcoholic beverages for 24  hours.  ACTIVITY:  You should plan to take it easy for the rest of today and you should NOT DRIVE or use heavy machinery until tomorrow (because of the sedation medicines used during the test).    FOLLOW UP: Our staff will call the number listed on your records the next business day following your procedure to check on you and address any questions or concerns that you may have regarding the information given to you following your procedure. If we do not reach you, we will leave a message.  However, if you are feeling well and you are not experiencing any problems, there is no need to return our call.  We will assume that you have returned to your regular daily activities without incident.  If any biopsies were taken you will be contacted by phone or by letter within the next 1-3 weeks.  Please call us at [phone] if you have not heard about the biopsies in 3 weeks.    SIGNATURES/CONFIDENTIALITY: You and/or your care partner have signed paperwork which will be entered into your electronic medical record.  These signatures attest to the fact that that the information above on your After Visit Summary has been reviewed and is understood.  Full responsibility of the confidentiality of this discharge information lies with you and/or your care-partner.  Please read over handouts about polyps, diverticulosis and high fiber diets  No Aspirin, Aspirin containing products (BC or Goody powders) or NSAIDs (Ibuprofen, Advil,  Aleve, Motrin) for 2 weeks- Tylenol is ok  Await pathology

## 2015-01-26 NOTE — Progress Notes (Signed)
Report to PACU, RN, vss, BBS= Clear.  

## 2015-01-27 ENCOUNTER — Telehealth: Payer: Self-pay | Admitting: *Deleted

## 2015-01-27 NOTE — Telephone Encounter (Signed)
Name identifier, left message, follow-up 

## 2015-02-04 ENCOUNTER — Encounter: Payer: Self-pay | Admitting: Gastroenterology

## 2015-06-13 IMAGING — CR DG CHEST 2V
2 series · 2 of 2 positions shown · non-contrast
Comparison: Portable chest x-ray of 05/19/2012

CLINICAL DATA: Shortness of breath, dry cough, former smoking
history

EXAM:
CHEST  2 VIEW

[w chest pa]
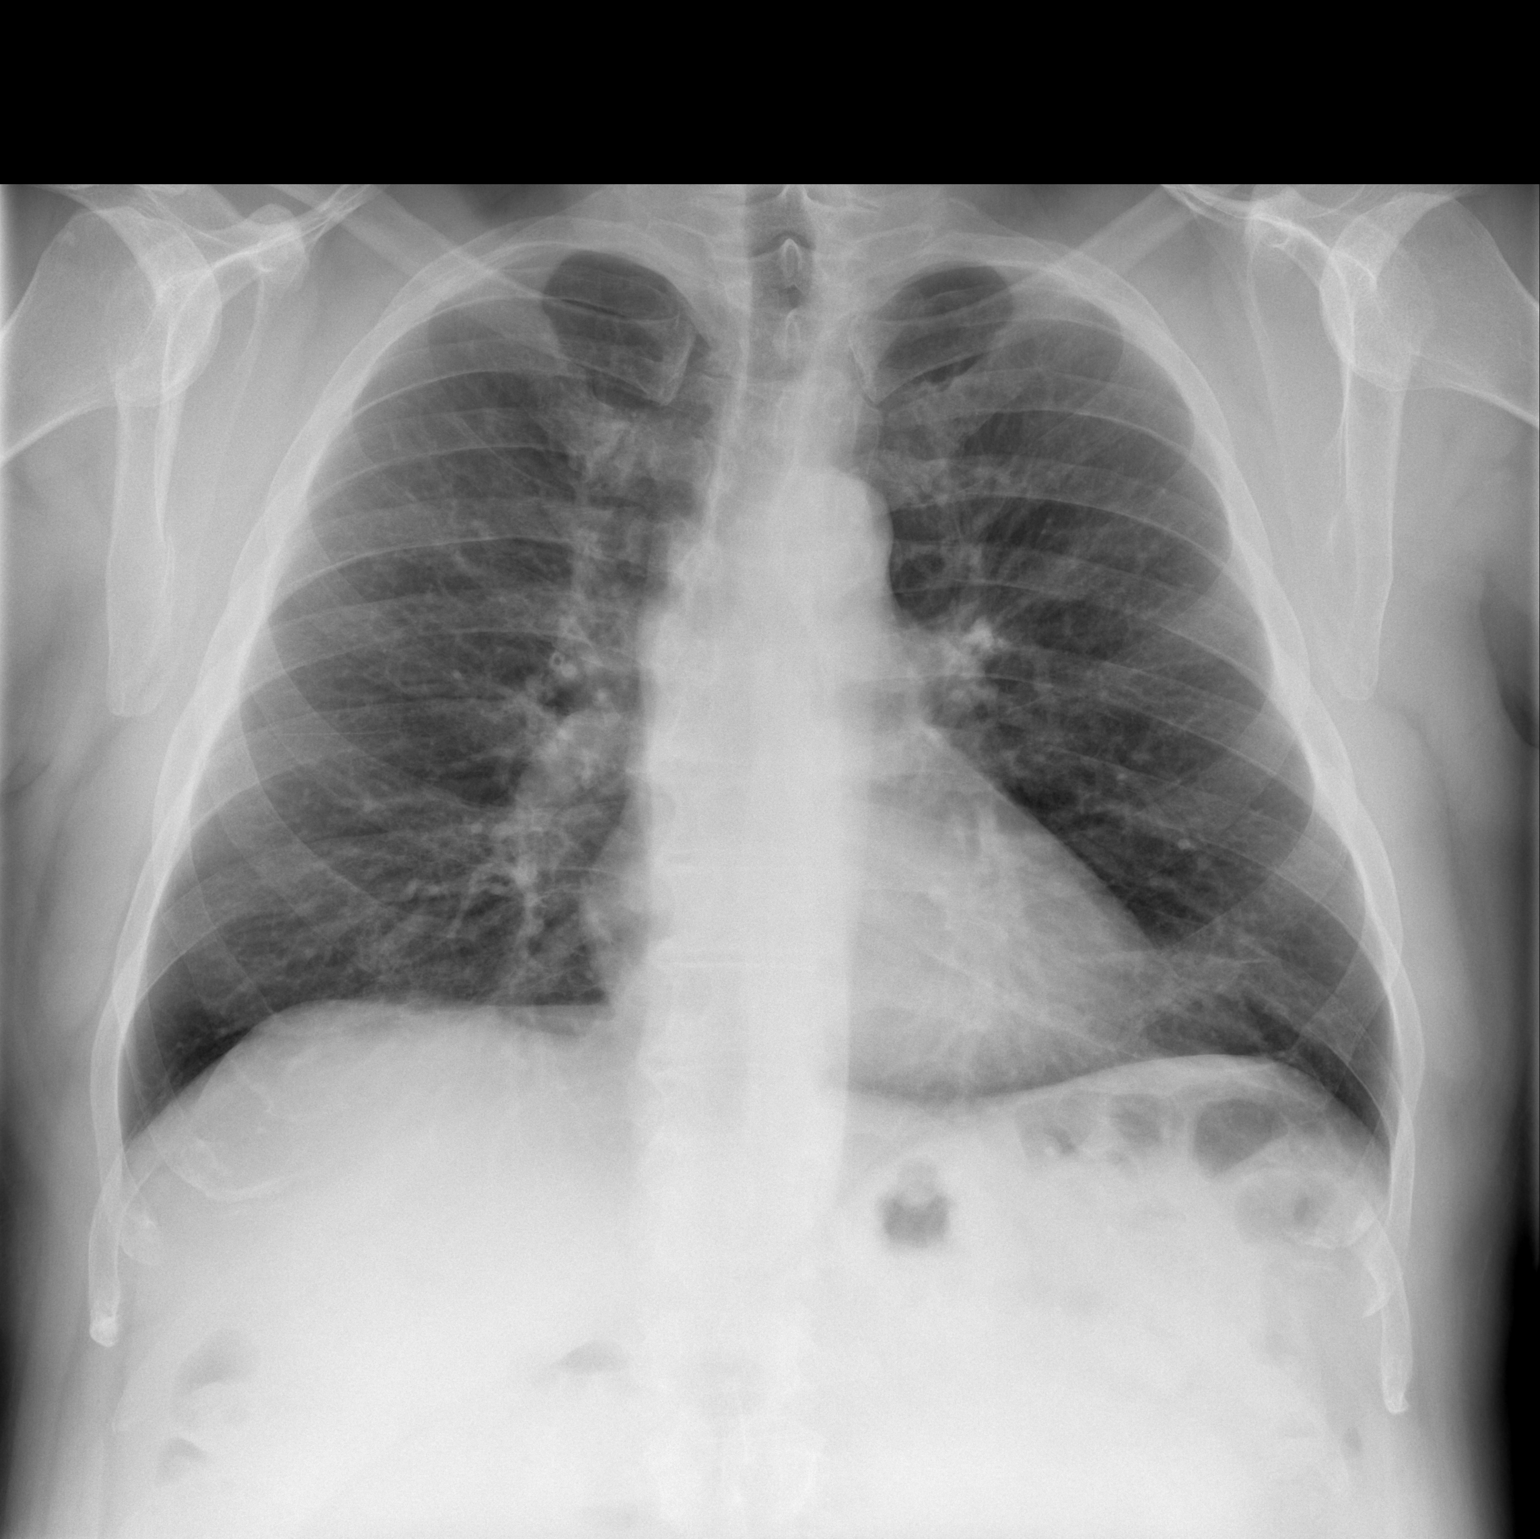

[w chest lat]
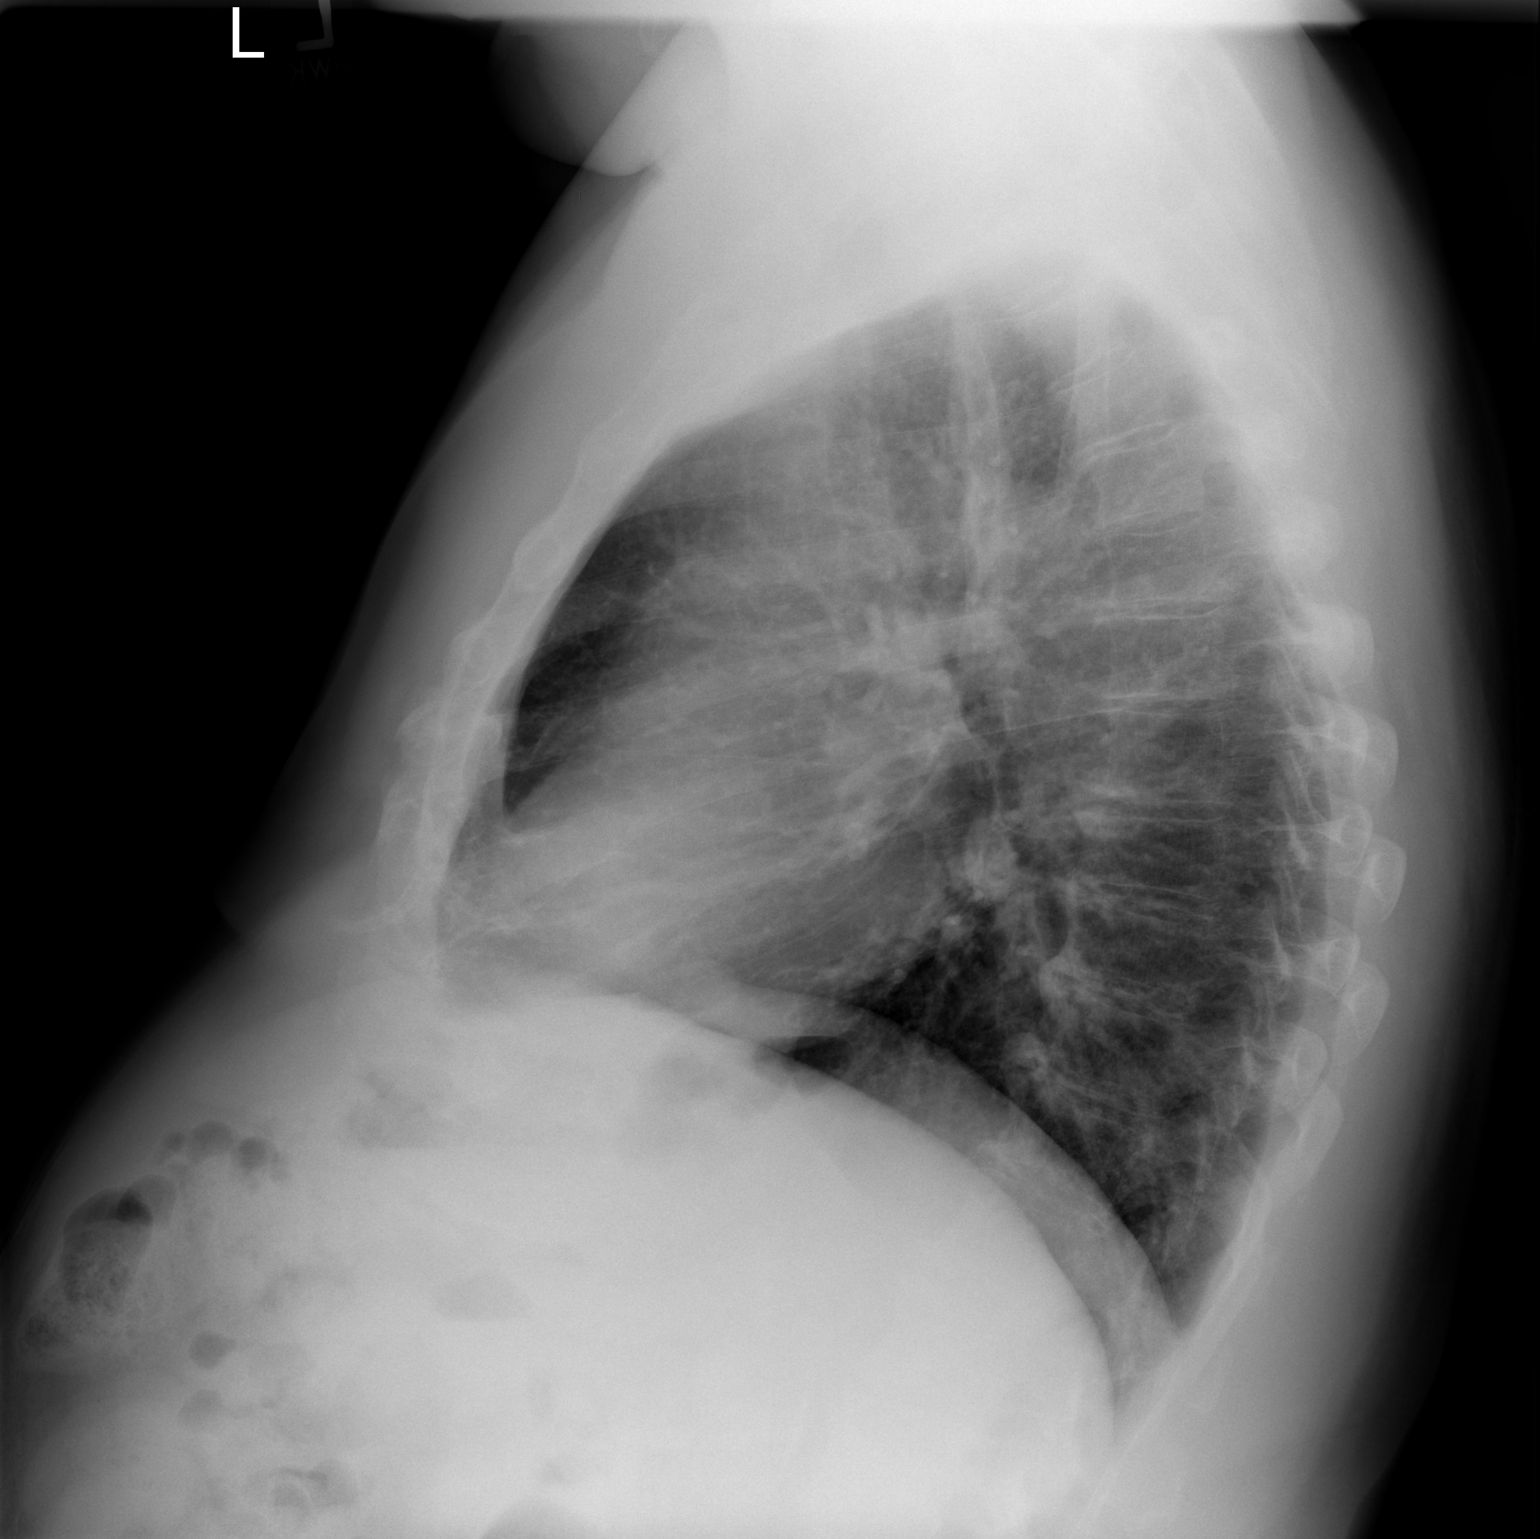

[2 of 2 positions shown; findings below may reference images not displayed]

FINDINGS: No focal infiltrate or effusion is seen. There is peribronchial
thickening which may indicate bronchitis, possibly chronic.
Mediastinal and hilar contours are unremarkable. The heart is within
normal limits in size. There are degenerative changes throughout the
thoracic spine.
IMPRESSION: Peribronchial thickening consistent with bronchitis. No definite
active process.

## 2016-03-06 DIAGNOSIS — R740 Nonspecific elevation of levels of transaminase and lactic acid dehydrogenase [LDH]: Secondary | ICD-10-CM | POA: Diagnosis not present

## 2017-01-02 DIAGNOSIS — R7301 Impaired fasting glucose: Secondary | ICD-10-CM | POA: Diagnosis not present

## 2017-01-02 DIAGNOSIS — I10 Essential (primary) hypertension: Secondary | ICD-10-CM | POA: Diagnosis not present

## 2017-01-02 DIAGNOSIS — E291 Testicular hypofunction: Secondary | ICD-10-CM | POA: Diagnosis not present

## 2017-05-15 DIAGNOSIS — R7301 Impaired fasting glucose: Secondary | ICD-10-CM | POA: Diagnosis not present

## 2017-05-15 DIAGNOSIS — Z23 Encounter for immunization: Secondary | ICD-10-CM | POA: Diagnosis not present

## 2017-05-15 DIAGNOSIS — I10 Essential (primary) hypertension: Secondary | ICD-10-CM | POA: Diagnosis not present

## 2017-05-15 DIAGNOSIS — J449 Chronic obstructive pulmonary disease, unspecified: Secondary | ICD-10-CM | POA: Diagnosis not present

## 2017-05-15 DIAGNOSIS — E291 Testicular hypofunction: Secondary | ICD-10-CM | POA: Diagnosis not present

## 2017-05-15 DIAGNOSIS — Z0000 Encounter for general adult medical examination without abnormal findings: Secondary | ICD-10-CM | POA: Diagnosis not present

## 2017-07-06 DIAGNOSIS — E291 Testicular hypofunction: Secondary | ICD-10-CM | POA: Diagnosis not present

## 2017-09-19 DIAGNOSIS — Z23 Encounter for immunization: Secondary | ICD-10-CM | POA: Diagnosis not present

## 2017-10-10 DIAGNOSIS — L989 Disorder of the skin and subcutaneous tissue, unspecified: Secondary | ICD-10-CM | POA: Diagnosis not present

## 2017-10-16 DIAGNOSIS — C44319 Basal cell carcinoma of skin of other parts of face: Secondary | ICD-10-CM | POA: Diagnosis not present

## 2017-11-14 DIAGNOSIS — C4401 Basal cell carcinoma of skin of lip: Secondary | ICD-10-CM | POA: Diagnosis not present

## 2017-11-19 DIAGNOSIS — R7301 Impaired fasting glucose: Secondary | ICD-10-CM | POA: Diagnosis not present

## 2017-11-19 DIAGNOSIS — E291 Testicular hypofunction: Secondary | ICD-10-CM | POA: Diagnosis not present

## 2017-11-19 DIAGNOSIS — J449 Chronic obstructive pulmonary disease, unspecified: Secondary | ICD-10-CM | POA: Diagnosis not present

## 2017-11-19 DIAGNOSIS — I10 Essential (primary) hypertension: Secondary | ICD-10-CM | POA: Diagnosis not present

## 2017-11-21 DIAGNOSIS — Z4802 Encounter for removal of sutures: Secondary | ICD-10-CM | POA: Diagnosis not present

## 2018-01-26 ENCOUNTER — Encounter: Payer: Self-pay | Admitting: Gastroenterology

## 2018-05-28 DIAGNOSIS — E291 Testicular hypofunction: Secondary | ICD-10-CM | POA: Diagnosis not present

## 2018-05-28 DIAGNOSIS — Z125 Encounter for screening for malignant neoplasm of prostate: Secondary | ICD-10-CM | POA: Diagnosis not present

## 2018-05-28 DIAGNOSIS — J449 Chronic obstructive pulmonary disease, unspecified: Secondary | ICD-10-CM | POA: Diagnosis not present

## 2018-05-28 DIAGNOSIS — R7301 Impaired fasting glucose: Secondary | ICD-10-CM | POA: Diagnosis not present

## 2018-05-28 DIAGNOSIS — I10 Essential (primary) hypertension: Secondary | ICD-10-CM | POA: Diagnosis not present

## 2018-05-28 DIAGNOSIS — Z0000 Encounter for general adult medical examination without abnormal findings: Secondary | ICD-10-CM | POA: Diagnosis not present

## 2018-05-28 DIAGNOSIS — Z23 Encounter for immunization: Secondary | ICD-10-CM | POA: Diagnosis not present

## 2018-06-21 ENCOUNTER — Encounter: Payer: Self-pay | Admitting: Gastroenterology

## 2018-08-05 ENCOUNTER — Ambulatory Visit (AMBULATORY_SURGERY_CENTER): Payer: Self-pay

## 2018-08-05 ENCOUNTER — Encounter: Payer: Self-pay | Admitting: Gastroenterology

## 2018-08-05 VITALS — Ht 68.0 in | Wt 216.8 lb

## 2018-08-05 DIAGNOSIS — Z86010 Personal history of colonic polyps: Secondary | ICD-10-CM

## 2018-08-05 MED ORDER — NA SULFATE-K SULFATE-MG SULF 17.5-3.13-1.6 GM/177ML PO SOLN: 1.0000 | Freq: Once | ORAL | 0 refills | Status: AC

## 2018-08-05 NOTE — Progress Notes (Signed)
Denies allergies to eggs or soy products. Denies complication of anesthesia or sedation. Denies use of weight loss medication. Denies use of O2.   Emmi instructions declined.  

## 2018-08-19 ENCOUNTER — Ambulatory Visit (AMBULATORY_SURGERY_CENTER): Payer: BLUE CROSS/BLUE SHIELD | Admitting: Gastroenterology

## 2018-08-19 ENCOUNTER — Encounter: Payer: Self-pay | Admitting: Gastroenterology

## 2018-08-19 VITALS — BP 124/67 | HR 65 | Temp 98.6°F | Resp 14 | Ht 68.0 in | Wt 231.0 lb

## 2018-08-19 DIAGNOSIS — D123 Benign neoplasm of transverse colon: Secondary | ICD-10-CM

## 2018-08-19 DIAGNOSIS — Z1211 Encounter for screening for malignant neoplasm of colon: Secondary | ICD-10-CM | POA: Diagnosis not present

## 2018-08-19 DIAGNOSIS — D122 Benign neoplasm of ascending colon: Secondary | ICD-10-CM

## 2018-08-19 DIAGNOSIS — Z86010 Personal history of colonic polyps: Secondary | ICD-10-CM | POA: Diagnosis not present

## 2018-08-19 DIAGNOSIS — D128 Benign neoplasm of rectum: Secondary | ICD-10-CM

## 2018-08-19 MED ORDER — SODIUM CHLORIDE 0.9 % IV SOLN: 500.0000 mL | Freq: Once | INTRAVENOUS | Status: DC

## 2018-08-19 NOTE — Progress Notes (Signed)
To PACU, vss. Report to [email or web address] 

## 2018-08-19 NOTE — Op Note (Signed)
[facility] Patient Name: Brad [name] Procedure Date: 08/19/2018 11:38 AM MRN: [id number] Endoscopist: Remo Lipps P. Havery Moros , MD Age: 67 Referring MD:  Date of Birth: 1951/11/10 Gender: Male Account #: [id number] Procedure:                Colonoscopy Indications:              Surveillance: Personal history of adenomatous                            polyps on last colonoscopy 3 years ago Medicines:                Monitored Anesthesia Care Procedure:                Pre-Anesthesia Assessment:                           - Prior to the procedure, a History and Physical                            was performed, and patient medications and                            allergies were reviewed. The patient's tolerance of                            previous anesthesia was also reviewed. The risks                            and benefits of the procedure and the sedation                            options and risks were discussed with the patient.                            All questions were answered, and informed consent                            was obtained. Prior Anticoagulants: The patient has                            taken no previous anticoagulant or antiplatelet                            agents. ASA Grade Assessment: II - A patient with                            mild systemic disease. After reviewing the risks                            and benefits, the patient was deemed in                            satisfactory condition to undergo the procedure.  After obtaining informed consent, the colonoscope                            was passed under direct vision. Throughout the                            procedure, the patient's blood pressure, pulse, and                            oxygen saturations were monitored continuously. The                            Colonoscope was introduced through the anus and                            advanced to the the  cecum, identified by                            appendiceal orifice and ileocecal valve. The                            colonoscopy was performed without difficulty. The                            patient tolerated the procedure well. The quality                            of the bowel preparation was adequate. The                            ileocecal valve, appendiceal orifice, and rectum                            were photographed. Scope In: 11:48:22 AM Scope Out: 12:25:20 PM Total Procedure Duration: 0 hours 36 minutes 58 seconds  Findings:                 The perianal and digital rectal examinations were                            normal.                           A diminutive polyp was found in the ascending                            colon. The polyp was sessile. The polyp was removed                            with a cold snare. Resection and retrieval were                            complete.  A 5 mm polyp was found in the hepatic flexure. The                            polyp was sessile. The polyp was removed with a                            cold snare. Resection and retrieval were complete.                           Eight sessile polyps were found in the transverse                            colon. The polyps were 2 to 6 mm in size. These                            polyps were removed with a cold snare. Resection                            and retrieval were complete.                           A 10 mm polyp was found in the transverse colon.                            The polyp was flat. The polyp was removed with a                            cold snare. Resection and retrieval were complete.                           A 5 mm polyp was found in the rectum. The polyp was                            sessile. The polyp was removed with a cold snare.                            Resection and retrieval were complete.                           Many  medium-mouthed diverticula were found in the                            left colon.                           The colon was redundant.                           Internal hemorrhoids were found during retroflexion.                           The exam was otherwise without abnormality. Complications:  No immediate complications. Estimated blood loss:                            Minimal. Estimated Blood Loss:     Estimated blood loss was minimal. Impression:               - One diminutive polyp in the ascending colon,                            removed with a cold snare. Resected and retrieved.                           - One 5 mm polyp at the hepatic flexure, removed                            with a cold snare. Resected and retrieved.                           - Eight 2 to 6 mm polyps in the transverse colon,                            removed with a cold snare. Resected and retrieved.                           - One 10 mm polyp in the transverse colon, removed                            with a cold snare. Resected and retrieved.                           - One 5 mm polyp in the rectum, removed with a cold                            snare. Resected and retrieved.                           - Diverticulosis in the left colon.                           - Redundant colon.                           - Internal hemorrhoids.                           - The examination was otherwise normal. Recommendation:           - Patient has a contact number available for                            emergencies. The signs and symptoms of potential                            delayed complications were discussed with the  patient. Return to normal activities tomorrow.                            Written discharge instructions were provided to the                            patient.                           - Resume previous diet.                           - Continue present  medications.                           - Await pathology results.                           - Repeat colonoscopy for surveillance based on                            pathology results. Remo Lipps P. Jovonta Levit, MD 08/19/2018 12:31:32 PM This report has been signed electronically.

## 2018-08-19 NOTE — Patient Instructions (Signed)
Discharge instructions given. Handouts on polyps,diverticulosis and hemorrhoids. Resume previous medications. YOU HAD AN ENDOSCOPIC PROCEDURE TODAY AT THE [facility]:   Refer to the procedure report that was given to you for any specific questions about what was found during the examination.  If the procedure report does not answer your questions, please call your gastroenterologist to clarify.  If you requested that your care partner not be given the details of your procedure findings, then the procedure report has been included in a sealed envelope for you to review at your convenience later.  YOU SHOULD EXPECT: Some feelings of bloating in the abdomen. Passage of more gas than usual.  Walking can help get rid of the air that was put into your GI tract during the procedure and reduce the bloating. If you had a lower endoscopy (such as a colonoscopy or flexible sigmoidoscopy) you may notice spotting of blood in your stool or on the toilet paper. If you underwent a bowel prep for your procedure, you may not have a normal bowel movement for a few days.  Please Note:  You might notice some irritation and congestion in your nose or some drainage.  This is from the oxygen used during your procedure.  There is no need for concern and it should clear up in a day or so.  SYMPTOMS TO REPORT IMMEDIATELY:   Following lower endoscopy (colonoscopy or flexible sigmoidoscopy):  Excessive amounts of blood in the stool  Significant tenderness or worsening of abdominal pains  Swelling of the abdomen that is new, acute  Fever of 100F or higher   For urgent or emergent issues, a gastroenterologist can be reached at any hour by calling [phone].   DIET:  We do recommend a small meal at first, but then you may proceed to your regular diet.  Drink plenty of fluids but you should avoid alcoholic beverages for 24 hours.  ACTIVITY:  You should plan to take it easy for the rest of today and you  should NOT DRIVE or use heavy machinery until tomorrow (because of the sedation medicines used during the test).    FOLLOW UP: Our staff will call the number listed on your records the next business day following your procedure to check on you and address any questions or concerns that you may have regarding the information given to you following your procedure. If we do not reach you, we will leave a message.  However, if you are feeling well and you are not experiencing any problems, there is no need to return our call.  We will assume that you have returned to your regular daily activities without incident.  If any biopsies were taken you will be contacted by phone or by letter within the next 1-3 weeks.  Please call us at [phone] if you have not heard about the biopsies in 3 weeks.    SIGNATURES/CONFIDENTIALITY: You and/or your care partner have signed paperwork which will be entered into your electronic medical record.  These signatures attest to the fact that that the information above on your After Visit Summary has been reviewed and is understood.  Full responsibility of the confidentiality of this discharge information lies with you and/or your care-partner. 

## 2018-08-19 NOTE — Progress Notes (Signed)
Called to room to assist during endoscopic procedure.  Patient ID and intended procedure confirmed with present staff. Received instructions for my participation in the procedure from the performing physician.  

## 2018-08-20 ENCOUNTER — Telehealth: Payer: Self-pay

## 2018-08-20 NOTE — Telephone Encounter (Signed)
  Follow up Call-  Call back number 08/19/2018  Post procedure Call Back phone  # [phone]  Permission to leave phone message Yes  Some recent data might be hidden     Patient questions:  Do you have a fever, pain , or abdominal swelling? No. Pain Score  0 *  Have you tolerated food without any problems? Yes.    Have you been able to return to your normal activities? Yes.    Do you have any questions about your discharge instructions: Diet   No. Medications  No. Follow up visit  No.  Do you have questions or concerns about your Care? No.  Actions: * If pain score is 4 or above: No action needed, pain <4.

## 2018-08-27 ENCOUNTER — Encounter: Payer: Self-pay | Admitting: Gastroenterology

## 2018-09-11 DIAGNOSIS — Z23 Encounter for immunization: Secondary | ICD-10-CM | POA: Diagnosis not present

## 2018-12-02 DIAGNOSIS — E291 Testicular hypofunction: Secondary | ICD-10-CM | POA: Diagnosis not present

## 2018-12-02 DIAGNOSIS — J449 Chronic obstructive pulmonary disease, unspecified: Secondary | ICD-10-CM | POA: Diagnosis not present

## 2018-12-02 DIAGNOSIS — R7301 Impaired fasting glucose: Secondary | ICD-10-CM | POA: Diagnosis not present

## 2018-12-02 DIAGNOSIS — I10 Essential (primary) hypertension: Secondary | ICD-10-CM | POA: Diagnosis not present

## 2019-05-22 DIAGNOSIS — T1511XA Foreign body in conjunctival sac, right eye, initial encounter: Secondary | ICD-10-CM | POA: Diagnosis not present

## 2019-05-22 DIAGNOSIS — H353131 Nonexudative age-related macular degeneration, bilateral, early dry stage: Secondary | ICD-10-CM | POA: Diagnosis not present

## 2019-05-22 DIAGNOSIS — H5213 Myopia, bilateral: Secondary | ICD-10-CM | POA: Diagnosis not present

## 2019-06-01 DIAGNOSIS — Z20828 Contact with and (suspected) exposure to other viral communicable diseases: Secondary | ICD-10-CM | POA: Diagnosis not present

## 2019-06-18 DIAGNOSIS — R7301 Impaired fasting glucose: Secondary | ICD-10-CM | POA: Diagnosis not present

## 2019-06-18 DIAGNOSIS — Z8249 Family history of ischemic heart disease and other diseases of the circulatory system: Secondary | ICD-10-CM | POA: Diagnosis not present

## 2019-06-18 DIAGNOSIS — N401 Enlarged prostate with lower urinary tract symptoms: Secondary | ICD-10-CM | POA: Diagnosis not present

## 2019-06-18 DIAGNOSIS — I10 Essential (primary) hypertension: Secondary | ICD-10-CM | POA: Diagnosis not present

## 2019-06-18 DIAGNOSIS — Z0000 Encounter for general adult medical examination without abnormal findings: Secondary | ICD-10-CM | POA: Diagnosis not present

## 2019-06-18 DIAGNOSIS — Z6833 Body mass index (BMI) 33.0-33.9, adult: Secondary | ICD-10-CM | POA: Diagnosis not present

## 2019-06-18 DIAGNOSIS — J449 Chronic obstructive pulmonary disease, unspecified: Secondary | ICD-10-CM | POA: Diagnosis not present

## 2019-06-18 DIAGNOSIS — E291 Testicular hypofunction: Secondary | ICD-10-CM | POA: Diagnosis not present

## 2019-09-19 DIAGNOSIS — D751 Secondary polycythemia: Secondary | ICD-10-CM | POA: Diagnosis not present

## 2019-12-12 ENCOUNTER — Ambulatory Visit: Payer: Self-pay | Attending: Family Medicine

## 2019-12-12 DIAGNOSIS — Z23 Encounter for immunization: Secondary | ICD-10-CM | POA: Insufficient documentation

## 2019-12-12 NOTE — Progress Notes (Signed)
   Covid-19 Vaccination Clinic  Name:  Brad [name]    MRN: [id number] DOB: 1951/08/25  12/12/2019  [name] was observed post Covid-19 immunization for 15 minutes without incidence. He was provided with Vaccine Information Sheet and instruction to access the V-Safe system.   [name] was instructed to call 911 with any severe reactions post vaccine: Marland Kitchen Difficulty breathing  . Swelling of your face and throat  . A fast heartbeat  . A bad rash all over your body  . Dizziness and weakness    Immunizations Administered    Name Date Dose VIS Date Route   Pfizer COVID-19 Vaccine 12/12/2019  1:57 PM 0.3 mL 10/31/2019 Intramuscular   Manufacturer: [place]   Lot: [id number]   [place]: [id number]

## 2019-12-19 DIAGNOSIS — I10 Essential (primary) hypertension: Secondary | ICD-10-CM | POA: Diagnosis not present

## 2019-12-19 DIAGNOSIS — J449 Chronic obstructive pulmonary disease, unspecified: Secondary | ICD-10-CM | POA: Diagnosis not present

## 2019-12-19 DIAGNOSIS — E291 Testicular hypofunction: Secondary | ICD-10-CM | POA: Diagnosis not present

## 2019-12-19 DIAGNOSIS — N401 Enlarged prostate with lower urinary tract symptoms: Secondary | ICD-10-CM | POA: Diagnosis not present

## 2020-01-02 ENCOUNTER — Ambulatory Visit: Payer: Self-pay | Attending: Internal Medicine

## 2020-01-02 DIAGNOSIS — Z23 Encounter for immunization: Secondary | ICD-10-CM | POA: Insufficient documentation

## 2020-01-02 NOTE — Progress Notes (Signed)
   Covid-19 Vaccination Clinic  Name:  Brad [name]    MRN: [id number] DOB: May 12, 1951  01/02/2020  [name] was observed post Covid-19 immunization for 15 minutes without incidence. He was provided with Vaccine Information Sheet and instruction to access the V-Safe system.   [name] was instructed to call 911 with any severe reactions post vaccine: Marland Kitchen Difficulty breathing  . Swelling of your face and throat  . A fast heartbeat  . A bad rash all over your body  . Dizziness and weakness    Immunizations Administered    Name Date Dose VIS Date Route   Pfizer COVID-19 Vaccine 01/02/2020  8:38 AM 0.3 mL 10/31/2019 Intramuscular   Manufacturer: [place]   Lot: [id number]   [place]: [id number]

## 2020-01-07 DIAGNOSIS — I10 Essential (primary) hypertension: Secondary | ICD-10-CM | POA: Diagnosis not present

## 2020-01-07 DIAGNOSIS — N401 Enlarged prostate with lower urinary tract symptoms: Secondary | ICD-10-CM | POA: Diagnosis not present

## 2020-01-07 DIAGNOSIS — R7309 Other abnormal glucose: Secondary | ICD-10-CM | POA: Diagnosis not present

## 2020-01-07 DIAGNOSIS — E291 Testicular hypofunction: Secondary | ICD-10-CM | POA: Diagnosis not present

## 2020-06-21 DIAGNOSIS — E291 Testicular hypofunction: Secondary | ICD-10-CM | POA: Diagnosis not present

## 2020-06-21 DIAGNOSIS — J449 Chronic obstructive pulmonary disease, unspecified: Secondary | ICD-10-CM | POA: Diagnosis not present

## 2020-06-21 DIAGNOSIS — Z0000 Encounter for general adult medical examination without abnormal findings: Secondary | ICD-10-CM | POA: Diagnosis not present

## 2020-06-21 DIAGNOSIS — Z13220 Encounter for screening for lipoid disorders: Secondary | ICD-10-CM | POA: Diagnosis not present

## 2020-06-21 DIAGNOSIS — N401 Enlarged prostate with lower urinary tract symptoms: Secondary | ICD-10-CM | POA: Diagnosis not present

## 2020-06-21 DIAGNOSIS — R7309 Other abnormal glucose: Secondary | ICD-10-CM | POA: Diagnosis not present

## 2020-06-21 DIAGNOSIS — I10 Essential (primary) hypertension: Secondary | ICD-10-CM | POA: Diagnosis not present

## 2020-06-21 DIAGNOSIS — Z23 Encounter for immunization: Secondary | ICD-10-CM | POA: Diagnosis not present

## 2020-08-06 DIAGNOSIS — H353131 Nonexudative age-related macular degeneration, bilateral, early dry stage: Secondary | ICD-10-CM | POA: Diagnosis not present

## 2020-08-06 DIAGNOSIS — H2513 Age-related nuclear cataract, bilateral: Secondary | ICD-10-CM | POA: Diagnosis not present

## 2020-08-06 DIAGNOSIS — H524 Presbyopia: Secondary | ICD-10-CM | POA: Diagnosis not present

## 2020-08-06 DIAGNOSIS — M25561 Pain in right knee: Secondary | ICD-10-CM | POA: Diagnosis not present

## 2020-08-09 DIAGNOSIS — M25561 Pain in right knee: Secondary | ICD-10-CM | POA: Diagnosis not present

## 2020-08-25 DIAGNOSIS — Z23 Encounter for immunization: Secondary | ICD-10-CM | POA: Diagnosis not present

## 2020-12-22 DIAGNOSIS — Z79899 Other long term (current) drug therapy: Secondary | ICD-10-CM | POA: Diagnosis not present

## 2020-12-22 DIAGNOSIS — I10 Essential (primary) hypertension: Secondary | ICD-10-CM | POA: Diagnosis not present

## 2020-12-22 DIAGNOSIS — R7309 Other abnormal glucose: Secondary | ICD-10-CM | POA: Diagnosis not present

## 2020-12-22 DIAGNOSIS — J449 Chronic obstructive pulmonary disease, unspecified: Secondary | ICD-10-CM | POA: Diagnosis not present

## 2020-12-22 DIAGNOSIS — E291 Testicular hypofunction: Secondary | ICD-10-CM | POA: Diagnosis not present

## 2021-07-06 DIAGNOSIS — R7309 Other abnormal glucose: Secondary | ICD-10-CM | POA: Diagnosis not present

## 2021-07-06 DIAGNOSIS — I10 Essential (primary) hypertension: Secondary | ICD-10-CM | POA: Diagnosis not present

## 2021-07-06 DIAGNOSIS — Z0000 Encounter for general adult medical examination without abnormal findings: Secondary | ICD-10-CM | POA: Diagnosis not present

## 2021-07-06 DIAGNOSIS — Z79899 Other long term (current) drug therapy: Secondary | ICD-10-CM | POA: Diagnosis not present

## 2021-07-06 DIAGNOSIS — E291 Testicular hypofunction: Secondary | ICD-10-CM | POA: Diagnosis not present

## 2021-07-06 DIAGNOSIS — Z125 Encounter for screening for malignant neoplasm of prostate: Secondary | ICD-10-CM | POA: Diagnosis not present

## 2021-07-08 DIAGNOSIS — E291 Testicular hypofunction: Secondary | ICD-10-CM | POA: Diagnosis not present

## 2021-07-08 DIAGNOSIS — Z0000 Encounter for general adult medical examination without abnormal findings: Secondary | ICD-10-CM | POA: Diagnosis not present

## 2021-07-08 DIAGNOSIS — I10 Essential (primary) hypertension: Secondary | ICD-10-CM | POA: Diagnosis not present

## 2021-07-08 DIAGNOSIS — N401 Enlarged prostate with lower urinary tract symptoms: Secondary | ICD-10-CM | POA: Diagnosis not present

## 2021-07-22 DIAGNOSIS — R972 Elevated prostate specific antigen [PSA]: Secondary | ICD-10-CM | POA: Diagnosis not present

## 2021-08-16 DIAGNOSIS — H2513 Age-related nuclear cataract, bilateral: Secondary | ICD-10-CM | POA: Diagnosis not present

## 2021-08-16 DIAGNOSIS — H5213 Myopia, bilateral: Secondary | ICD-10-CM | POA: Diagnosis not present

## 2021-08-16 DIAGNOSIS — H353131 Nonexudative age-related macular degeneration, bilateral, early dry stage: Secondary | ICD-10-CM | POA: Diagnosis not present

## 2022-01-04 ENCOUNTER — Other Ambulatory Visit: Payer: Self-pay | Admitting: Family Medicine

## 2022-01-04 DIAGNOSIS — R591 Generalized enlarged lymph nodes: Secondary | ICD-10-CM

## 2022-01-05 ENCOUNTER — Ambulatory Visit
Admission: RE | Admit: 2022-01-05 | Discharge: 2022-01-05 | Disposition: A | Discharge status: Home / Self Care | Payer: Self-pay | Source: Ambulatory Visit | Attending: Family Medicine | Admitting: Family Medicine

## 2022-01-05 DIAGNOSIS — R591 Generalized enlarged lymph nodes: Secondary | ICD-10-CM

## 2022-01-05 MED ORDER — IOPAMIDOL (ISOVUE-300) INJECTION 61%
75.0000 mL | Freq: Once | INTRAVENOUS | Status: AC | PRN
  Administered 2022-01-05: 75 mL via INTRAVENOUS

## 2022-01-11 ENCOUNTER — Other Ambulatory Visit: Payer: Self-pay | Admitting: Otolaryngology

## 2022-01-11 ENCOUNTER — Other Ambulatory Visit (HOSPITAL_COMMUNITY): Payer: Self-pay | Admitting: Otolaryngology

## 2022-01-11 DIAGNOSIS — C7989 Secondary malignant neoplasm of other specified sites: Secondary | ICD-10-CM

## 2022-01-12 ENCOUNTER — Telehealth: Payer: Self-pay | Admitting: Hematology and Oncology

## 2022-01-12 NOTE — Telephone Encounter (Signed)
Scheduled appt per 2/22 referral. Pt is aware of appt date and time. Pt is aware to arrive 15 mins prior to appt time and to bring and updated insurance card. Pt is aware of appt location.   

## 2022-01-13 NOTE — Progress Notes (Signed)
Oncology Nurse Navigator Documentation   Placed introductory call to new referral patient Brad [name]. Introduced myself as the H&N oncology nurse navigator that works with Dr. Isidore Moos and Dr. Chryl Heck to whom he has been referred by Dr. Constance Holster. He confirmed understanding of referral. Briefly explained my role as his navigator, provided my contact information.  Confirmed understanding of upcoming appts and [place] location, explained arrival and registration process. I explained the purpose of a dental evaluation prior to starting RT, indicated he would be contacted by [facility] to arrange an appt.   I encouraged him to call with questions/concerns as he moves forward with appts and procedures.   He verbalized understanding of information provided, expressed appreciation for my call.   Navigator Initial Assessment Employment Status: he is retired Currently on [employer] / STD:no Living Situation:he lives with his wife.  Support System:wife, family PCP: Dibas Koirala MD PCD:not seen for awhile  Financial Concerns:no Transportation Needs: no Sensory Deficits:no Language Barriers/Interpreter Needed:  no Ambulation Needs: no DME Used in Home: no Psychosocial Needs:  no Concerns/Needs Understanding Cancer:  addressed/answered by navigator to best of ability Self-Expressed Needs: no   Harlow Asa RN, BSN, OCN Head & Neck Oncology Nurse [place] [facility] Phone # [phone]  Fax # [phone]

## 2022-01-25 ENCOUNTER — Other Ambulatory Visit: Payer: Self-pay

## 2022-01-25 ENCOUNTER — Encounter (HOSPITAL_COMMUNITY)
Admission: RE | Admit: 2022-01-25 | Discharge: 2022-01-25 | Disposition: A | Discharge status: Home / Self Care | Payer: Medicare Other | Source: Ambulatory Visit | Attending: Otolaryngology | Admitting: Otolaryngology

## 2022-01-25 DIAGNOSIS — C801 Malignant (primary) neoplasm, unspecified: Secondary | ICD-10-CM | POA: Insufficient documentation

## 2022-01-25 DIAGNOSIS — C7989 Secondary malignant neoplasm of other specified sites: Secondary | ICD-10-CM

## 2022-01-25 DIAGNOSIS — J351 Hypertrophy of tonsils: Secondary | ICD-10-CM | POA: Insufficient documentation

## 2022-01-25 DIAGNOSIS — R599 Enlarged lymph nodes, unspecified: Secondary | ICD-10-CM | POA: Insufficient documentation

## 2022-01-25 LAB — GLUCOSE, CAPILLARY: Glucose-Capillary: 95 mg/dL (ref 70–99)

## 2022-01-25 MED ORDER — FLUDEOXYGLUCOSE F - 18 (FDG) INJECTION
11.9000 | Freq: Once | INTRAVENOUS | Status: AC
  Administered 2022-01-25: 10.82 via INTRAVENOUS

## 2022-01-27 NOTE — Progress Notes (Signed)
Head and Neck Cancer Location of Tumor / Histology:  ?Malignant neoplasm of left neck ? ?Patient presented with symptoms of: noticed a lump in the left neck. He underwent a CT scan which revealed a pathologic appearing lymph node but no primary site identified. ? ?PET Scan ?01/25/2022 ?--IMPRESSION: ?No clear primary lesion within the oropharynx or hypopharynx. Bilateral lingual tonsil metabolic activity as described above. ?Enlarged hypermetabolic LEFT level II  lymph node. ?No distant metastatic disease. ? ?Biopsies revealed:  ?01/06/2022 ?Final Interpretation   ?A.  LEFT NECK, MASS, FINE NEEDLE ASPIRATION I, SMEARS AND THINPREP: ?             Consistent with squamous cell carcinoma ? ?Nutrition Status Yes No Comments  ?Weight changes? '[]'$  '[x]'$    ?Swallowing concerns? '[]'$  '[x]'$    ?PEG? '[]'$  '[x]'$    ? ?Referrals Yes No Comments  ?Social Work? '[x]'$  '[]'$    ?Dentistry? '[x]'$  '[]'$    ?Swallowing therapy? '[x]'$  '[]'$    ?Nutrition? '[x]'$  '[]'$    ?Med/Onc? '[x]'$  '[]'$    ? ?Safety Issues Yes No Comments  ?Prior radiation? '[]'$  '[x]'$    ?Pacemaker/ICD? '[]'$  '[x]'$    ?Possible current pregnancy? '[]'$  '[x]'$  N/A  ?Is the patient on methotrexate? '[]'$  '[x]'$    ? ?Tobacco/Marijuana/Snuff/ETOH use: Reports he smokes occasionally and drinks at least 1 drink every evening. Denies any recreational drug use ? ?Past/Anticipated interventions by otolaryngology, if any:  ?01/26/2022 ?--Dr. Izora Gala (telephone call) ?We discussed the results of the PET scan. There is no evidence of distant disease. There may be asymmetric single in the lingual tonsil region. Recommend we proceed with exam under anesthesia, directed biopsies, modified neck dissection. He is scheduled to see the cancer center specialists next week. Risks and benefits of surgery were discussed including facial nerve and accessory nerve injury. He will need to be in the hospital least overnight, up to 2 or 3 days. If we identify a small cancer in the oropharynx he may be a candidate for transoral robotic surgery at [facility]. ? ?01/06/2022 ?--Dr. Izora Gala (office visit) ?Flexible fiberoptic laryngoscopy ?Fine needle aspiration of left neck mass ? ?Past/Anticipated interventions by medical oncology, if any:  ?Scheduled for consultation with Dr. Arletha Pili Iruku 01/31/2022 ? ? ?Current Complaints / other details:  Nothing else of note ? ? ? ? ?

## 2022-01-29 NOTE — Progress Notes (Signed)
Radiation Oncology         [phone] ________________________________  Initial Outpatient Consultation  Name: Brad [name] MRN: [id number]  Date: 01/30/2022  DOB: 07-15-1951  HY:[id number], Dibas, MD  Izora Gala, MD   REFERRING PHYSICIAN: Izora Gala, MD  DIAGNOSIS:    ICD-10-CM   1. Squamous cell carcinoma of head and neck (HCC)  C76.0     2. Secondary and unspecified malignant neoplasm of lymph nodes of head, face and neck (HCC)  C77.0      Staging pending HPV status  Left neck mass - metastatic squamous cell carcinoma (unknown primary)  CHIEF COMPLAINT: Here to discuss management of neck cancer  HISTORY OF PRESENT ILLNESS::Brad [name] is a 71 y.o. male who presented to his PCP, Dr. Dorthy Cooler, on 01/05/22 with left neck lymphadenopathy. Neck CT performed for evaluation on that same date revealed an enlarged and heterogeneous left level 2 lymph node, measuring 5.0 x 2.8 cm, noted as highly suspicious for nodal metastatic disease. No definite primary neoplasm was otherwise identified within the oral cavity, pharynx or larynx, or any other enlarged or suspicious lymph nodes elsewhere within the neck.   Subsequently, the patient was referred to Dr. Constance Holster on 01/06/22 for further evaluation of the left neck lump. During this visit, the patient reported first noticing the left neck mass 2 days prior to presentation, and denied any symptoms related to it. Subsequently, Dr. Constance Holster performed on laryngoscopy on this same date which revealed no abnormal findings. FNA of left neck mass was also performed during this visit which revealed findings consistent with squamous cell carcinoma.   Pertinent imaging thus far includes a PET scan performed on 01/25/22 revealing fairly symmetric hypermetabolic activity in the lingual tonsil region, with an SUV max of 7.9 on the left, and an SUV max of 4.9 on the right. No clear primary mass lesion was identified. PET also showed an enlarged hypermetabolic  left level 2 lymph node measuring 24 mm in the short axis, though >3cm in greatest dimension to my eye, with an SUV max of 15.7. No additional enlarged or hypermetabolic cervical lymph nodes were appreciated. No signs of distant metastatic disease were appreciated either.  Bilateral lingual tonsil SUV uptake.  Dr. Constance Holster contacted the patient by telephone on 03/09 and discussed the results of the PET scan. Ultimately, Dr. Constance Holster recommends proceeding with exam under anesthesia, directed biopsies, and modified neck dissection.  Swallowing issues, if any: none  Weight Changes: none  Pain status: none  Other symptoms: none (only symptom seems to be left neck swelling)  Tobacco history, if any: smokes cigars - reports smoking for 40 yrs. Does not feel motivated to quit at present.   ETOH abuse, if any: drinks at least 1 drink every evening  Prior cancers, if any: none  He is a retired Music therapist.  He denies prior skin cancers.   PREVIOUS RADIATION THERAPY: No  PAST MEDICAL HISTORY:  has a past medical history of Allergy, Bronchitis, Constipation, COPD (chronic obstructive pulmonary disease) ([place]), Hypercholesteremia, and Hypertension.    PAST SURGICAL HISTORY: Past Surgical History:  Procedure Laterality Date   COLONOSCOPY     FRACTURE SURGERY     right wrist   Lipo Suction     POLYPECTOMY      FAMILY HISTORY: family history includes Cancer in his brother.  SOCIAL HISTORY:  reports that he has been smoking cigarettes. He has never used smokeless tobacco. He reports current alcohol use of about 7.0 standard  drinks per week. He reports that he does not use drugs.  ALLERGIES: Patient has no known allergies.  MEDICATIONS:  Current Outpatient Medications  Medication Sig Dispense Refill   albuterol (PROVENTIL HFA;VENTOLIN HFA) 108 (90 BASE) MCG/ACT inhaler Inhale 2 puffs into the lungs every 6 (six) hours as needed. shortness of breath     aspirin 81 MG chewable tablet Chew 81  mg by mouth daily.     CRESTOR 10 MG tablet Take 10 mg by mouth daily.      losartan (COZAAR) 50 MG tablet Take 50 mg by mouth daily.     magnesium hydroxide (MILK OF MAGNESIA) 400 MG/5ML suspension Take by mouth daily as needed for mild constipation.     Multiple Vitamin (MULTIVITAMIN WITH MINERALS) TABS Take 1 tablet by mouth daily.     sildenafil (VIAGRA) 100 MG tablet Take 100 mg by mouth daily as needed for erectile dysfunction.     tamsulosin (FLOMAX) 0.4 MG CAPS capsule Take 0.4 mg by mouth.     testosterone (ANDROGEL) 50 MG/5GM (1%) GEL Place 5 g onto the skin daily.     tiotropium (SPIRIVA) 18 MCG inhalation capsule Place 18 mcg into inhaler and inhale daily.     vitamin E 400 UNIT capsule Take 400 Units by mouth daily.     No current facility-administered medications for this encounter.    REVIEW OF SYSTEMS:  Notable for that above.   PHYSICAL EXAM:  height is '5\' 8"'$  (1.727 m) and weight is 213 lb 8 oz (96.8 kg). His temporal temperature is 97.3 F (36.3 C) (abnormal). His blood pressure is 134/70 and his pulse is 74. His respiration is 18 and oxygen saturation is 97%.   General: Alert and oriented, in no acute distress HEENT: Head is normocephalic. Extraocular movements are intact. Oropharynx is notable for no lesions. No obvious skin cancers on face or scalp (left side). Neck: Neck is notable for 3 cm level II left neck mass, no skin involvement. Heart: Regular in rate and rhythm with no murmurs, rubs, or gallops. Chest: Clear to auscultation bilaterally, with no rhonchi, wheezes, or rales. Abdomen: Soft, nontender, nondistended, with no rigidity or guarding. Extremities: No cyanosis or edema. Lymphatics: see Neck Exam Skin: No concerning lesions. Musculoskeletal: symmetric strength and muscle tone throughout. Neurologic: Cranial nerves II through XII are grossly intact. No obvious focalities. Speech is fluent. Coordination is intact. Psychiatric: Judgment and insight are  intact. Affect is appropriate.   ECOG = 0  0 - Asymptomatic (Fully active, able to carry on all predisease activities without restriction)  1 - Symptomatic but completely ambulatory (Restricted in physically strenuous activity but ambulatory and able to carry out work of a light or sedentary nature. For example, light housework, office work)  2 - Symptomatic, <50% in bed during the day (Ambulatory and capable of all self care but unable to carry out any work activities. Up and about more than 50% of waking hours)  3 - Symptomatic, >50% in bed, but not bedbound (Capable of only limited self-care, confined to bed or chair 50% or more of waking hours)  4 - Bedbound (Completely disabled. Cannot carry on any self-care. Totally confined to bed or chair)  5 - Death   Eustace Pen MM, Creech RH, Tormey DC, et al. [phone]). "Toxicity and response criteria of the [facility]". [place] Oncol. 5 (6): 649-55   LABORATORY DATA:  Lab Results  Component Value Date   WBC 13.7 (H) 05/21/2012  HGB 14.6 05/21/2012   HCT 45.2 05/21/2012   MCV 95.2 05/21/2012   PLT 206 05/21/2012   CMP     Component Value Date/Time   NA 138 05/20/2012 0418   K 4.3 05/20/2012 0418   CL 104 05/20/2012 0418   CO2 25 05/20/2012 0418   GLUCOSE 149 (H) 05/20/2012 0418   BUN 13 05/20/2012 0418   CREATININE 0.75 05/20/2012 0418   CALCIUM 9.1 05/20/2012 0418   GFRNONAA >90 05/20/2012 0418   GFRAA >90 05/20/2012 0418      No results found for: TSH   RADIOGRAPHY: CT SOFT TISSUE NECK W CONTRAST  Result Date: 01/05/2022 CLINICAL DATA:  Provided history: Lymphadenopathy. Additional history provided by scanning technologist: Lymphadenopathy (left side under ear, marked with vitamin E tablet). EXAM: CT NECK WITH CONTRAST TECHNIQUE: Multidetector CT imaging of the neck was performed using the standard protocol following the bolus administration of intravenous contrast. RADIATION DOSE REDUCTION: This exam  was performed according to the departmental dose-optimization program which includes automated exposure control, adjustment of the mA and/or kV according to patient size and/or use of iterative reconstruction technique. CONTRAST:  23m ISOVUE-300 IOPAMIDOL (ISOVUE-300) INJECTION 61% COMPARISON:  No pertinent prior exams available for comparison. FINDINGS: Pharynx and larynx: Streak and beam hardening artifact arising from dental restoration partially obscures the oral cavity. No appreciable swelling or discrete mass within the oral cavity, pharynx or larynx. Salivary glands: No inflammation, mass, or stone. Thyroid: Unremarkable. Lymph nodes: 5.0 x 2.8 cm enlarged left level 2 lymph node with heterogeneous internal hypodensity (for instance as seen on series 12, image 82) (series 2, image 75). No pathologically enlarged or suspicious lymph nodes are identified elsewhere within the neck. Vascular: The major vascular structures of the neck are patent. Atherosclerotic plaque within the visualized aortic arch, proximal major branch vessels of the neck and carotid arteries. Limited intracranial: No appreciable acute intracranial abnormality within the field of view. Visualized orbits: No orbital mass or acute orbital finding. Mastoids and visualized paranasal sinuses: Trace mucosal thickening within the bilateral ethmoid sinuses. No significant mastoid effusion. Skeleton: Cervical spondylosis. No acute bony abnormality or aggressive osseous lesion. Upper chest: No consolidation within the imaged lung apices. These results will be called to the ordering clinician or representative by the Radiologist Assistant, and communication documented in the PACS or [employer] IMPRESSION: Enlarged and heterogeneous left level 2 lymph node, measuring 5.0 x 2.8 cm. This is highly suspicious for nodal metastatic disease. Direct tissue sampling should be considered. No definite primary neoplasm is identified within the oral cavity,  pharynx or larynx. However, ENT consultation is recommended and pharyngolaryngoscopy should be considered. Additionally, a PET-CT may be helpful in locating a primary neoplasm. No pathologically enlarged or suspicious lymph nodes identified elsewhere within the neck. Electronically Signed   By: KKellie SimmeringD.O.   On: 01/05/2022 14:04   NM PET Image Initial (PI) Skull Base To Thigh (F-18 FDG)  Result Date: 01/26/2022 CLINICAL DATA:  Initial treatment strategy for LEFT neck squamous cell carcinoma. EXAM: NUCLEAR MEDICINE PET SKULL BASE TO THIGH TECHNIQUE: 10.8 mCi F-18 FDG was injected intravenously. Full-ring PET imaging was performed from the skull base to thigh after the radiotracer. CT data was obtained and used for attenuation correction and anatomic localization. Fasting blood glucose: 95 mg/dl COMPARISON:  Neck CT 01/05/2022 FINDINGS: Mediastinal blood pool activity: SUV max 1.8 Liver activity: SUV max NA NECK: Fairly symmetric hypermetabolic activity in the lingual tonsil region with SUV max equal 7.9  on the LEFT and SUV max equal 4.9 on the RIGHT. No mass lesion identified. Enlarged hypermetabolic LEFT level 2 lymph node measures 24 mm short axis with SUV max equal 15.7. No additional enlarged or hypermetabolic cervical lymph nodes. Incidental CT findings: none CHEST: No hypermetabolic mediastinal or hilar nodes. No suspicious pulmonary nodules on the CT scan. Incidental CT findings: Coronary artery calcification and aortic atherosclerotic calcification. ABDOMEN/PELVIS: No abnormal hypermetabolic activity within the liver, pancreas, adrenal glands, or spleen. No hypermetabolic lymph nodes in the abdomen or pelvis. Incidental CT findings: Large cyst within the LEFT renal hilum measures 7 cm SKELETON: No aggressive osseous lesion. Incidental CT findings: none IMPRESSION: 1. No clear primary lesion within the oropharynx or hypopharynx. Bilateral lingual tonsil metabolic activity as described above. 2.  Enlarged hypermetabolic LEFT level II  lymph node. 3. No distant metastatic disease. Electronically Signed   By: Suzy Bouchard M.D.   On: 01/26/2022 09:46      IMPRESSION/PLAN:  This is a delightful patient with head and neck cancer  - single left neck node, level II, >3cm.  Neck dissection and mucosal biopsies pending, primary is unknown.  He understands after our discussion today that the results from his biopsies/surgery will provide more information on staging and potentially the primary site of his cancer. HPV and EBV testing will be obtained to help [place] understand the likelihood of primaries in the throat.  I asked the patient today about tobacco use. The patient uses tobacco. This may have contributed to causing his cancer. I advised the patient to quit. Services were offered by me today including outpatient counseling and pharmacotherapy. I assessed for the willingness to attempt to quit and provided encouragement and demonstrated willingness to make referrals and/or prescriptions to help the patient attempt to quit. The patient has follow-up with the oncologic team to touch base on their tobacco use and /or cessation efforts.  Over 3 minutes were spent on this issue. He is not quite ready, mentally, to quit, but knows to reach out if he would like help.  He understands that 6 weeks of radiation will likely be beneficial to the left neck, given the size of his node, and potentially the throat and contralateral neck.  TORS surgery may be a possible treatment for the throat if any biopsies of the mucosal axis are positive.  We discussed the potential risks, benefits, and side effects of radiotherapy. We talked in detail about acute and late effects. We discussed that some of the most bothersome acute effects may be mucositis, dysgeusia, salivary changes, skin irritation, hair loss, dehydration, weight loss and fatigue. We talked about late effects which include but are not necessarily limited to  dysphagia, hypothyroidism, nerve injury, vascular injury, spinal cord injury, xerostomia, trismus, neck edema, and potential injury to any of the tissues in the head and neck region. No guarantees of treatment were given.    We will discuss his results post-surgery at tumor board.  I will be in touch with Dr. Constance Holster.  Mr. Heidrick is pleased with this plan.  On date of service, in total, I spent 60 minutes on this encounter. Patient was seen in person.  __________________________________________   Eppie Gibson, MD  This document serves as a record of services personally performed by Eppie Gibson, MD. It was created on her behalf by Roney Mans, a trained medical scribe. The creation of this record is based on the scribe's personal observations and the provider's statements to them. This document has been  checked and approved by the attending provider.

## 2022-01-30 ENCOUNTER — Ambulatory Visit: Payer: Medicare Other

## 2022-01-30 ENCOUNTER — Ambulatory Visit
Admission: RE | Admit: 2022-01-30 | Discharge: 2022-01-30 | Disposition: A | Discharge status: Home / Self Care | Payer: Medicare Other | Source: Ambulatory Visit | Attending: Radiation Oncology | Admitting: Radiation Oncology

## 2022-01-30 ENCOUNTER — Other Ambulatory Visit: Payer: Self-pay

## 2022-01-30 ENCOUNTER — Encounter: Payer: Self-pay | Admitting: Radiation Oncology

## 2022-01-30 VITALS — BP 134/70 | HR 74 | Temp 97.3°F | Resp 18 | Ht 68.0 in | Wt 213.5 lb

## 2022-01-30 DIAGNOSIS — J449 Chronic obstructive pulmonary disease, unspecified: Secondary | ICD-10-CM | POA: Diagnosis not present

## 2022-01-30 DIAGNOSIS — M47812 Spondylosis without myelopathy or radiculopathy, cervical region: Secondary | ICD-10-CM | POA: Insufficient documentation

## 2022-01-30 DIAGNOSIS — C801 Malignant (primary) neoplasm, unspecified: Secondary | ICD-10-CM | POA: Insufficient documentation

## 2022-01-30 DIAGNOSIS — C760 Malignant neoplasm of head, face and neck: Secondary | ICD-10-CM

## 2022-01-30 DIAGNOSIS — C770 Secondary and unspecified malignant neoplasm of lymph nodes of head, face and neck: Secondary | ICD-10-CM

## 2022-01-30 DIAGNOSIS — I700 Atherosclerosis of aorta: Secondary | ICD-10-CM | POA: Insufficient documentation

## 2022-01-30 DIAGNOSIS — I6529 Occlusion and stenosis of unspecified carotid artery: Secondary | ICD-10-CM | POA: Insufficient documentation

## 2022-01-30 DIAGNOSIS — K5900 Constipation, unspecified: Secondary | ICD-10-CM | POA: Insufficient documentation

## 2022-01-30 DIAGNOSIS — J351 Hypertrophy of tonsils: Secondary | ICD-10-CM | POA: Diagnosis not present

## 2022-01-30 DIAGNOSIS — Z7982 Long term (current) use of aspirin: Secondary | ICD-10-CM | POA: Diagnosis not present

## 2022-01-30 DIAGNOSIS — Z79899 Other long term (current) drug therapy: Secondary | ICD-10-CM | POA: Insufficient documentation

## 2022-01-30 DIAGNOSIS — Z809 Family history of malignant neoplasm, unspecified: Secondary | ICD-10-CM | POA: Diagnosis not present

## 2022-01-30 DIAGNOSIS — I10 Essential (primary) hypertension: Secondary | ICD-10-CM | POA: Diagnosis not present

## 2022-01-30 DIAGNOSIS — E7800 Pure hypercholesterolemia, unspecified: Secondary | ICD-10-CM | POA: Insufficient documentation

## 2022-01-31 ENCOUNTER — Inpatient Hospital Stay: Payer: Medicare Other

## 2022-01-31 ENCOUNTER — Encounter: Payer: Self-pay | Admitting: Hematology and Oncology

## 2022-01-31 ENCOUNTER — Inpatient Hospital Stay: Payer: Medicare Other | Attending: Hematology and Oncology | Admitting: Hematology and Oncology

## 2022-01-31 VITALS — BP 149/64 | HR 81 | Temp 97.9°F | Resp 16 | Ht 68.0 in | Wt 215.1 lb

## 2022-01-31 DIAGNOSIS — Z79899 Other long term (current) drug therapy: Secondary | ICD-10-CM | POA: Diagnosis not present

## 2022-01-31 DIAGNOSIS — Z7982 Long term (current) use of aspirin: Secondary | ICD-10-CM | POA: Insufficient documentation

## 2022-01-31 DIAGNOSIS — C801 Malignant (primary) neoplasm, unspecified: Secondary | ICD-10-CM | POA: Diagnosis present

## 2022-01-31 DIAGNOSIS — C770 Secondary and unspecified malignant neoplasm of lymph nodes of head, face and neck: Secondary | ICD-10-CM | POA: Insufficient documentation

## 2022-01-31 DIAGNOSIS — F17210 Nicotine dependence, cigarettes, uncomplicated: Secondary | ICD-10-CM | POA: Insufficient documentation

## 2022-01-31 NOTE — Progress Notes (Signed)
[place] ?CONSULT NOTE ? ?Patient Care Team: ?Brad Amel, MD as PCP - General (Family Medicine) ? ?CHIEF COMPLAINTS/PURPOSE OF CONSULTATION:  ?Malignant cervical lymphadenopathy ? ?ASSESSMENT & PLAN:  ? ?This is a very pleasant 71 year old male patient with past medical history significant for smoking, with newly diagnosed with malignant lymphadenopathy noted on a single left neck node, pathology showed squamous cell carcinoma referred to medical oncology for recommendations.   ?It appears that so far we do not have a definitive evidence of primary tumor. ?Physical examination today with palpable large cervical lymph node in the left neck measuring over 3 cm on exam.  There is no other palpable lymphadenopathy. ?At this time I agree with further evaluation as planned by Dr. Constance Holster for modified neck dissection and direct exam under anesthesia with directed biopsies.  If there is a primary identified the oropharynx, then he may be referred to transoral robotic surgery at [facility].  At this time it is not quite clear if he will need any chemotherapy.  We have however discussed about mechanism of action of cisplatin, adverse effects of cisplatin such as fatigue, nausea, vomiting, cytopenias, nephrotoxicity, ototoxicity and peripheral neuropathy.  He understands that some of the side effects can be life-threatening and fatal. ?He will return to clinic for follow-up with [place] in the next 3 weeks or so to discuss further about role of chemotherapy.  He expressed understanding. ? ?HISTORY OF PRESENTING ILLNESS:  ?Brad [name] 71 y.o. male is here because of squamous cell carcinoma identified in the left cervical lymphadenopathy ? ?This is a 71 year old male patient with enlarged lymph node on his left neck noticed on the day of Super Bowl when he was shaving, seek care from his primary care physician and saw Dr. Constance Holster a couple days later.  He had further evaluation with CT neck which showed enlarged  and heterogeneous left level 2 lymph node measuring 5 x 2.8 cm noted as highly suspicious for nodal metastatic disease.  No definite primary neoplasm was otherwise identified.  He also had laryngoscopy which showed no abnormal findings.  Fine-needle aspiration showed findings consistent with squamous cell carcinoma.  He also had PET/CT which showed no clear primary lesion within the oropharynx or hypopharynx, bilateral lingual tonsil metabolic activity, enlarged hypermetabolic left level 2 lymph node, no distant metastatic disease. ?He is here for initial visit with medical oncology. ? 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  He smoked 1 pack/day for several years, however slowed down on his smoking for the past 9 years.  He used to drink alcohol regularly.  He tells me that he has no pain and feels very healthy.  He goes to the gym every other day, spent 45 minutes on his stationary bike and lifts some weights for 20 minutes.  He tries to stay active as best as he can.  No change in speech, swallowing or hearing.  Rest of the pertinent 10 point ROS reviewed and negative. ? ?REVIEW OF SYSTEMS:   ?Constitutional: Denies fevers, chills or abnormal night sweats ?Eyes: Denies blurriness of vision, double vision or watery eyes ?Ears, nose, mouth, throat, and face: Denies mucositis or sore throat ?Respiratory: Denies cough, dyspnea or wheezes ?Cardiovascular: Denies palpitation, chest discomfort or lower extremity swelling ?Gastrointestinal:  Denies nausea, heartburn or change in bowel habits ?Skin: Denies abnormal skin rashes ?Lymphatics: Denies new lymphadenopathy or easy bruising ?Neurological:Denies numbness, tingling or new weaknesses ?Behavioral/Psych:  Mood is stable, no new changes  ?All other systems were reviewed with the patient and are negative. ? ?MEDICAL HISTORY:  ?Past Medical History:  ?Diagnosis Date  ? Allergy   ? Bronchitis   ? chronic  ?  Constipation   ? COPD (chronic obstructive pulmonary disease) ([place])   ? Hypercholesteremia   ? Hypertension   ? ? ?SURGICAL HISTORY: ?Past Surgical History:  ?Procedure Laterality Date  ? COLONOSCOPY    ? FRACTURE SURGERY    ? right wrist  ? Lipo Suction    ? POLYPECTOMY    ? ? ?SOCIAL HISTORY: ?Social History  ? ?Socioeconomic History  ? Marital status: Married  ?  Spouse name: Not on file  ? Number of children: Not on file  ? Years of education: Not on file  ? Highest education level: Not on file  ?Occupational History  ? Not on file  ?Tobacco Use  ? Smoking status: Light Smoker  ?  Years: 30.00  ?  Types: Cigarettes  ? Smokeless tobacco: Never  ? Tobacco comments:  ?  smokes less than a pack a week  ?Vaping Use  ? Vaping Use: Never used  ?Substance and Sexual Activity  ? Alcohol use: Yes  ?  Alcohol/week: 7.0 standard drinks  ?  Types: 7 Shots of liquor per week  ?  Comment: weekly   ? Drug use: No  ? Sexual activity: Not Currently  ?Other Topics Concern  ? Not on file  ?Social History Narrative  ? Not on file  ? ?Social Determinants of Health  ? ?Financial Resource Strain: Not on file  ?Food Insecurity: Not on file  ?Transportation Needs: Not on file  ?Physical Activity: Not on file  ?Stress: Not on file  ?Social Connections: Not on file  ?Intimate Partner Violence: Not on file  ? ? ?FAMILY HISTORY: ?Family History  ?Problem Relation Age of Onset  ? Cancer Brother   ? Colon cancer Neg Hx   ? Esophageal cancer Neg Hx   ? Rectal cancer Neg Hx   ? Stomach cancer Neg Hx   ? ? ?ALLERGIES:  has No Known Allergies. ? ?MEDICATIONS:  ?Current Outpatient Medications  ?Medication Sig Dispense Refill  ? albuterol (PROVENTIL HFA;VENTOLIN HFA) 108 (90 BASE) MCG/ACT inhaler Inhale 2 puffs into the lungs every 6 (six) hours as needed. shortness of breath    ? aspirin 81 MG chewable tablet Chew 81 mg by mouth daily.    ? CRESTOR 10 MG tablet Take 10 mg by mouth daily.     ? losartan (COZAAR) 50 MG tablet Take 50 mg by mouth  daily.    ? magnesium hydroxide (MILK OF MAGNESIA) 400 MG/5ML suspension Take by mouth daily as needed for mild constipation.    ? Multiple Vitamin (MULTIVITAMIN WITH MINERALS) TABS Take 1 tablet by mouth daily.    ? sildenafil (VIAGRA) 100 MG tablet Take 100 mg by mouth daily as needed for erectile dysfunction.    ? tamsulosin (FLOMAX) 0.4 MG CAPS capsule Take 0.4 mg by mouth.    ? testosterone (ANDROGEL) 50 MG/5GM (1%) GEL Place 5 g onto the skin daily.    ? tiotropium (SPIRIVA) 18 MCG inhalation capsule Place 18 mcg into inhaler and inhale daily.    ? vitamin E 400 UNIT capsule Take 400 Units by mouth daily.    ? ?No current facility-administered medications for this visit.  ? ? ? ?PHYSICAL EXAMINATION: ?ECOG PERFORMANCE STATUS: 0 - Asymptomatic ? ?Vitals:  ?  01/31/22 1043  ?BP: (!) 149/64  ?Pulse: 81  ?Resp: 16  ?Temp: 97.9 ?F (36.6 ?C)  ?SpO2: 95%  ? ?Filed Weights  ? 01/31/22 1043  ?Weight: 215 lb 1.6 oz (97.6 kg)  ? ? ?GENERAL:alert, no distress and comfortable ?SKIN: skin color, texture, turgor are normal, no rashes or significant lesions ?EYES: normal, conjunctiva are pink and non-injected, sclera clear ?OROPHARYNX:no exudate, no erythema and lips, buccal mucosa, and tongue normal  ?NECK: supple, thyroid normal size, non-tender, without nodularity ?LYMPH: Large palpable cervical lymph node on the left side measuring about 3 cm on physical exam.  No other palpable lymphadenopathy LUNGS: clear to auscultation and percussion with normal breathing effort ?HEART: regular rate & rhythm and no murmurs and no lower extremity edema ?ABDOMEN:abdomen soft, non-tender and normal bowel sounds ?Musculoskeletal:no cyanosis of digits and no clubbing  ?PSYCH: alert & oriented x 3 with fluent speech ?NEURO: no focal motor/sensory deficits ? ?LABORATORY DATA:  ?I have reviewed the data as listed ?Lab Results  ?Component Value Date  ? WBC 13.7 (H) 05/21/2012  ? HGB 14.6 05/21/2012  ? HCT 45.2 05/21/2012  ? MCV 95.2 05/21/2012   ? PLT 206 05/21/2012  ? ?  Chemistry   ?   ?Component Value Date/Time  ? NA 138 05/20/2012 0418  ? K 4.3 05/20/2012 0418  ? CL 104 05/20/2012 0418  ? CO2 25 05/20/2012 0418  ? BUN 13 05/20/2012 0418  ? CREATINI

## 2022-01-31 NOTE — Progress Notes (Signed)
Oncology Nurse Navigator Documentation  ? ?Met with patient after his initial consult with Dr. Chryl Heck.   ?Further introduced myself as his Navigator, explained my role as a member of the Care Team. ?He verbalized understanding of information provided. ?He has asked me to contact Dr. Janeice Robinson office to see if his surgery has been scheduled yet. I will let him know what I am able to learn from Dr. Janeice Robinson office.  ?I encouraged him to call with questions/concerns moving forward. ? ?Harlow Asa, RN, BSN, OCN ?Head & Neck Oncology Nurse Navigator ?[place] [facility] [place] ?[phone]  ?

## 2022-02-02 ENCOUNTER — Ambulatory Visit: Payer: Medicare Other | Admitting: Hematology and Oncology

## 2022-02-21 ENCOUNTER — Inpatient Hospital Stay: Payer: Medicare Other | Admitting: Adult Health

## 2022-02-21 NOTE — Pre-Procedure Instructions (Addendum)
Surgical Instructions ? ? ? Your procedure is scheduled on Friday, April 14th. ? Report to [facility] Main Entrance "A" at 5:30 A.M., then check in with the Admitting office. ? Call this number if you have problems the morning of surgery: ? [phone] ? ? If you have any questions prior to your surgery date call [phone]: Open Monday-Friday 8am-4pm ? ? ? Remember: ? Do not eat after midnight the night before your surgery ? ?You may drink clear liquids until 4:30 a.m. the morning of your surgery.   ?Clear liquids allowed are: Water, Non-Citrus Juices (without pulp), Carbonated Beverages, Clear Tea, Black Coffee ONLY (NO MILK, CREAM OR POWDERED CREAMER of any kind), and Gatorade. ?  ? Take these medicines the morning of surgery with A SIP OF WATER:  ?Budeson-Glycopyrrol-Formoterol (BREZTRI AEROSPHERE) ?rosuvastatin (CRESTOR) ?tamsulosin [facility])  ?albuterol (PROVENTIL HFA;VENTOLIN HFA)-as needed, please bring inhaler with you to the hospital. ? ?Follow your surgeon's instructions on when to stop Aspirin.  If no instructions were given by your surgeon then you will need to call the office to get those instructions.   ? ?As of today, STOP taking any Aleve, Naproxen, Ibuprofen, Motrin, Advil, Goody's, BC's, all herbal medications, fish oil, and all vitamins. ? ?         ?Do not wear jewelry. ?Do not wear lotions, powders, colognes, or deodorant. ?Men may shave face and neck. ?Do not bring valuables to the hospital. ? ?[facility] is not responsible for any belongings or valuables. .  ? ?Do NOT Smoke (Tobacco/Vaping)  24 hours prior to your procedure ? ?If you use a CPAP at night, you may bring your mask for your overnight stay. ?  ?Contacts, glasses, hearing aids, dentures or partials may not be worn into surgery, please bring cases for these belongings ?  ?For patients admitted to the hospital, discharge time will be determined by your treatment team. ?  ?Patients discharged the day of surgery will not be allowed  to drive home, and someone needs to stay with them for 24 hours. ? ? ?SURGICAL WAITING ROOM VISITATION ?Patients having surgery or a procedure in a hospital may have two support people. ?Children under the age of 51 must have an adult with them who is not the patient. ?They may stay in the waiting area during the procedure and may switch out with other visitors. If the patient needs to stay at the hospital during part of their recovery, the visitor guidelines for inpatient rooms apply. ? ?Please refer to the [facility] website for the visitor guidelines for Inpatients (after your surgery is over and you are in a regular room).  ? ? ? ? ? ?Special instructions:   ? ?Oral Hygiene is also important to reduce your risk of infection.  Remember - BRUSH YOUR TEETH THE MORNING OF SURGERY WITH YOUR REGULAR TOOTHPASTE ? ? ?[facility]- Preparing For Surgery ? ?Before surgery, you can play an important role. Because skin is not sterile, your skin needs to be as free of germs as possible. You can reduce the number of germs on your skin by washing with CHG (chlorahexidine gluconate) Soap before surgery.  CHG is an antiseptic cleaner which kills germs and bonds with the skin to continue killing germs even after washing.   ? ? ?Please do not use if you have an allergy to CHG or antibacterial soaps. If your skin becomes reddened/irritated stop using the CHG.  ?Do not shave (including legs and underarms) for at least 48  hours prior to first CHG shower. It is OK to shave your face. ? ?Please follow these instructions carefully. ?  ? ? Shower the NIGHT BEFORE SURGERY and the MORNING OF SURGERY with CHG Soap.  ? If you chose to wash your hair, wash your hair first as usual with your normal shampoo. After you shampoo, rinse your hair and body thoroughly to remove the shampoo.  Then [employer] and genitals (private parts) with your normal soap and rinse thoroughly to remove soap. ? ?After that Use CHG Soap as you would any other liquid  soap. You can apply CHG directly to the skin and wash gently with a scrungie or a clean washcloth.  ? ?Apply the CHG Soap to your body ONLY FROM THE NECK DOWN.  Do not use on open wounds or open sores. Avoid contact with your eyes, ears, mouth and genitals (private parts). Wash Face and genitals (private parts)  with your normal soap.  ? ?Wash thoroughly, paying special attention to the area where your surgery will be performed. ? ?Thoroughly rinse your body with warm water from the neck down. ? ?DO NOT shower/wash with your normal soap after using and rinsing off the CHG Soap. ? ?Pat yourself dry with a CLEAN TOWEL. ? ?Wear CLEAN PAJAMAS to bed the night before surgery ? ?Place CLEAN SHEETS on your bed the night before your surgery ? ?DO NOT SLEEP WITH PETS. ? ? ?Day of Surgery: ? ?Take a shower with CHG soap. ?Wear Clean/Comfortable clothing the morning of surgery ?Do not apply any deodorants/lotions.   ?Remember to brush your teeth WITH YOUR REGULAR TOOTHPASTE. ? ? ? ?If you received a COVID test during your pre-op visit  it is requested that you wear a mask when out in public, stay away from anyone that may not be feeling well and notify your surgeon if you develop symptoms. If you have been in contact with anyone that has tested positive in the last 10 days please notify you surgeon. ? ?  ?Please read over the following fact sheets that you were given.  ? ?

## 2022-02-22 ENCOUNTER — Other Ambulatory Visit: Payer: Self-pay

## 2022-02-22 ENCOUNTER — Encounter (HOSPITAL_COMMUNITY)
Admission: RE | Admit: 2022-02-22 | Discharge: 2022-02-22 | Disposition: A | Discharge status: Home / Self Care | Payer: Medicare Other | Source: Ambulatory Visit | Attending: Otolaryngology | Admitting: Otolaryngology

## 2022-02-22 ENCOUNTER — Ambulatory Visit (HOSPITAL_COMMUNITY)
Admission: RE | Admit: 2022-02-22 | Discharge: 2022-02-22 | Disposition: A | Discharge status: Home / Self Care | Payer: Medicare Other | Source: Ambulatory Visit | Attending: Otolaryngology | Admitting: Otolaryngology

## 2022-02-22 ENCOUNTER — Encounter (HOSPITAL_COMMUNITY): Payer: Self-pay

## 2022-02-22 VITALS — BP 141/58 | HR 106 | Temp 98.3°F | Resp 19 | Ht 68.0 in | Wt 210.5 lb

## 2022-02-22 DIAGNOSIS — Z01818 Encounter for other preprocedural examination: Secondary | ICD-10-CM | POA: Insufficient documentation

## 2022-02-22 HISTORY — DX: Dyspnea, unspecified: R06.00

## 2022-02-22 HISTORY — DX: Malignant (primary) neoplasm, unspecified: C80.1

## 2022-02-22 LAB — COMPREHENSIVE METABOLIC PANEL
ALT: 19 U/L (ref 0–44)
AST: 21 U/L (ref 15–41)
Albumin: 3.6 g/dL (ref 3.5–5.0)
Alkaline Phosphatase: 44 U/L (ref 38–126)
Anion gap: 8 (ref 5–15)
BUN: 17 mg/dL (ref 8–23)
CO2: 27 mmol/L (ref 22–32)
Calcium: 9.2 mg/dL (ref 8.9–10.3)
Chloride: 105 mmol/L (ref 98–111)
Creatinine, Ser: 0.93 mg/dL (ref 0.61–1.24)
GFR, Estimated: >60 mL/min (ref >60)
Glucose, Bld: 149 mg/dL — ABNORMAL HIGH (ref 70–99)
Potassium: 3.9 mmol/L (ref 3.5–5.1)
Sodium: 140 mmol/L (ref 135–145)
Total Bilirubin: 0.5 mg/dL (ref 0.3–1.2)
Total Protein: 6.7 g/dL (ref 6.5–8.1)

## 2022-02-22 LAB — CBC
HCT: 50.5 % (ref 39.0–52.0)
Hemoglobin: 16.4 g/dL (ref 13.0–17.0)
MCH: 31.8 pg (ref 26.0–34.0)
MCHC: 32.5 g/dL (ref 30.0–36.0)
MCV: 97.9 fL (ref 80.0–100.0)
Platelets: 156 10*3/uL (ref 150–400)
RBC: 5.16 MIL/uL (ref 4.22–5.81)
RDW: 12.9 % (ref 11.5–15.5)
WBC: 5 10*3/uL (ref 4.0–10.5)
nRBC: 0 % (ref 0.0–0.2)

## 2022-02-22 NOTE — Pre-Procedure Instructions (Signed)
Surgical Instructions ? ? ? Your procedure is scheduled on Friday, April 14th. ? Report to [facility] Main Entrance "A" at 5:30 A.M., then check in with the Admitting office. ? Call this number if you have problems the morning of surgery: ? [phone] ? ? If you have any questions prior to your surgery date call [phone]: Open Monday-Friday 8am-4pm ? ? ? Remember: ? Do not eat or drink after midnight the night before your surgery ? ?  ? Take these medicines the morning of surgery with A SIP OF WATER:  ?Budeson-Glycopyrrol-Formoterol (BREZTRI AEROSPHERE) ?rosuvastatin (CRESTOR) ?tamsulosin [facility])  ?albuterol (PROVENTIL HFA;VENTOLIN HFA)-as needed, please bring inhaler with you to the hospital. ? ?Follow your surgeon's instructions on when to stop Aspirin.  If no instructions were given by your surgeon then you will need to call the office to get those instructions.   ? ?As of today, STOP taking any Aleve, Naproxen, Ibuprofen, Motrin, Advil, Goody's, BC's, all herbal medications, fish oil, and all vitamins. ? ?         ?Do not wear jewelry. ?Do not wear lotions, powders, colognes, or deodorant. ?Men may shave face and neck. ?Do not bring valuables to the hospital. ? ?[facility] is not responsible for any belongings or valuables. .  ? ?Do NOT Smoke (Tobacco/Vaping)  24 hours prior to your procedure ? ?If you use a CPAP at night, you may bring your mask for your overnight stay. ?  ?Contacts, glasses, hearing aids, dentures or partials may not be worn into surgery, please bring cases for these belongings ?  ?For patients admitted to the hospital, discharge time will be determined by your treatment team. ?  ?Patients discharged the day of surgery will not be allowed to drive home, and someone needs to stay with them for 24 hours. ? ? ?SURGICAL WAITING ROOM VISITATION ?Patients having surgery or a procedure in a hospital may have two support people. ?Children under the age of 55 must have an adult with them who is  not the patient. ?They may stay in the waiting area during the procedure and may switch out with other visitors. If the patient needs to stay at the hospital during part of their recovery, the visitor guidelines for inpatient rooms apply. ? ?Please refer to the [facility] website for the visitor guidelines for Inpatients (after your surgery is over and you are in a regular room).  ? ? ? ? ? ?Special instructions:   ? ?Oral Hygiene is also important to reduce your risk of infection.  Remember - BRUSH YOUR TEETH THE MORNING OF SURGERY WITH YOUR REGULAR TOOTHPASTE ? ? ?[facility]- Preparing For Surgery ? ?Before surgery, you can play an important role. Because skin is not sterile, your skin needs to be as free of germs as possible. You can reduce the number of germs on your skin by washing with CHG (chlorahexidine gluconate) Soap before surgery.  CHG is an antiseptic cleaner which kills germs and bonds with the skin to continue killing germs even after washing.   ? ? ?Please do not use if you have an allergy to CHG or antibacterial soaps. If your skin becomes reddened/irritated stop using the CHG.  ?Do not shave (including legs and underarms) for at least 48 hours prior to first CHG shower. It is OK to shave your face. ? ?Please follow these instructions carefully. ?  ? ? Shower the NIGHT BEFORE SURGERY and the MORNING OF SURGERY with CHG Soap.  ? If you  chose to wash your hair, wash your hair first as usual with your normal shampoo. After you shampoo, rinse your hair and body thoroughly to remove the shampoo.  Then [employer] and genitals (private parts) with your normal soap and rinse thoroughly to remove soap. ? ?After that Use CHG Soap as you would any other liquid soap. You can apply CHG directly to the skin and wash gently with a scrungie or a clean washcloth.  ? ?Apply the CHG Soap to your body ONLY FROM THE NECK DOWN.  Do not use on open wounds or open sores. Avoid contact with your eyes, ears, mouth and  genitals (private parts). Wash Face and genitals (private parts)  with your normal soap.  ? ?Wash thoroughly, paying special attention to the area where your surgery will be performed. ? ?Thoroughly rinse your body with warm water from the neck down. ? ?DO NOT shower/wash with your normal soap after using and rinsing off the CHG Soap. ? ?Pat yourself dry with a CLEAN TOWEL. ? ?Wear CLEAN PAJAMAS to bed the night before surgery ? ?Place CLEAN SHEETS on your bed the night before your surgery ? ?DO NOT SLEEP WITH PETS. ? ? ?Day of Surgery: ? ?Take a shower with CHG soap. ?Wear Clean/Comfortable clothing the morning of surgery ?Do not apply any deodorants/lotions.   ?Remember to brush your teeth WITH YOUR REGULAR TOOTHPASTE. ? ? ? ?If you received a COVID test during your pre-op visit  it is requested that you wear a mask when out in public, stay away from anyone that may not be feeling well and notify your surgeon if you develop symptoms. If you have been in contact with anyone that has tested positive in the last 10 days please notify you surgeon. ? ?  ?Please read over the following fact sheets that you were given.  ? ?

## 2022-02-22 NOTE — Progress Notes (Signed)
PCP:  Lujean Amel, MD ?Cardiologist:  denies ? ?EKG: 02/22/22 ?CXR: 02/22/22 ?ECHO:  denies ?Stress Test:  denies ?Cardiac Cath:  denies ? ?Fasting Blood Sugar-  na ?Checks Blood Sugar__na_ times a day ? ?OSA/CPAP: No ? ?ASA: Patient will call office today for instructions ?Blood Thinner: No ? ?Covid test scheduled 02/27/22 at 0900 ? ?Anesthesia Review: No ? ?Patient denies shortness of breath, fever, cough, and chest pain at PAT appointment. ? ?Patient verbalized understanding of instructions provided today at the PAT appointment.  Patient asked to review instructions at home and day of surgery.   ?

## 2022-02-22 NOTE — H&P (Signed)
?HPI:  ? ?Brad [name] is a 71 y.o. male who presents as a consult Patient.  ? ?Referring Provider: Lujean Amel, MD ? ?Chief complaint: Neck mass. ? ?HPI: A few days ago he noticed a lump in the left neck. He had not noticed it before. He has no symptoms related to it. He denies any pain, sore throat, trouble swallowing. He drinks at least 1 drink every evening. He smokes cigars. He has not had any weight loss. He underwent a CT scan which revealed a pathologic appearing lymph node but no primary site identified. ? ?PMH/Meds/All/SocHx/FamHx/ROS:  ? ?History reviewed. No pertinent past medical history. ? ?History reviewed. No pertinent surgical history. ? ?No family history of bleeding disorders, wound healing problems or difficulty with anesthesia.  ? ?Social History  ? ?Socioeconomic History  ? Marital status: Not on file  ?Spouse name: Not on file  ? Number of children: Not on file  ? Years of education: Not on file  ? Highest education level: Not on file  ?Occupational History  ? Not on file  ?Tobacco Use  ? Smoking status: Some Days  ?Types: Cigarettes  ? Smokeless tobacco: Current  ?Substance and Sexual Activity  ? Alcohol use: Yes  ? Drug use: Not on file  ? Sexual activity: Not on file  ?Other Topics Concern  ? Not on file  ?Social History Narrative  ? Not on file  ? ?Social Determinants of Health  ? ?Financial Resource Strain: Not on file  ?Food Insecurity: Not on file  ?Transportation Needs: Not on file  ?Physical Activity: Not on file  ?Stress: Not on file  ?Social Connections: Not on file  ?Housing Stability: Not on file  ? ?Current Outpatient Medications:  ? albuterol 90 mcg/actuation inhaler, 2 PUFFS AS NEEDED EVERY 6 HOURLY AS NEEDED FOR WHEEZING INHALATION 30 DAYS 25, Disp: , Rfl:  ? aspirin 81 MG chewable tablet *ANTIPLATELET*, Chew 1 tablet (81 mg total) by mouth., Disp: , Rfl:  ? budesonide-glycopyr-formoterol (BREZTRI AEROSPHERE) 160-9-4.8 mcg/actuation inhaler, INHALE 2 PUFFS INTO THE LUNGS  TWICE A DAY FOR 30 DAYS, Disp: , Rfl:  ? losartan-hydrochlorothiazide (HYZAAR) 50-12.5 mg per tablet, Take 1 tablet by mouth daily., Disp: , Rfl:  ? multivitamin capsule, 1 tablet, Disp: , Rfl:  ? rosuvastatin (CRESTOR) 20 MG tablet, Take 1 tablet (20 mg total) by mouth daily., Disp: , Rfl:  ? sildenafiL (VIAGRA) 100 MG tablet, 1/2 TABLET TO 1 TABLET AS NEEDED ORALLY 30 DAYS, Disp: , Rfl:  ? tamsulosin (FLOMAX) 0.4 mg Cap capsule, Take 1 capsule (0.4 mg total) by mouth daily., Disp: , Rfl:  ? testosterone 20.25 mg/1.25 gram (1.62 %) gel, , Disp: , Rfl:  ? vitamin E 400 UNIT capsule, Take 1 capsule (400 Units total) by mouth daily., Disp: , Rfl:  ? ?A complete ROS was performed with pertinent positives/negatives noted in the HPI. The remainder of the ROS are negative. ? ? ?Physical Exam:  ? ?Temp 98 ?F (36.7 ?C)  ? ?General: Healthy and alert, in no distress, breathing easily. Normal affect. In a pleasant mood. ?Head: Normocephalic, atraumatic. No masses, or scars. ?Eyes: Pupils are equal, and reactive to light. Vision is grossly intact. No spontaneous or gaze nystagmus. ?Ears: Ear canals are clear. Tympanic membranes are intact, with normal landmarks and the middle ears are clear and healthy. ?Hearing: Grossly normal. ?Nose: Nasal cavities are clear with healthy mucosa, no polyps or exudate. Airways are patent. ?Face: No masses or scars, facial nerve function is  symmetric. ?Oral Cavity: No mucosal abnormalities are noted. Tongue with normal mobility. Dentition appears healthy. ?Oropharynx: Tonsils are symmetric. There are no mucosal masses identified. Tongue base appears normal and healthy. ?Larynx/Hypopharynx: Indirect exam inadequate due to excessive gag reflex. ?Chest: Deferred ?Neck: 3-4 cm firm anterior level 2 node on the left, otherwise no adenopathy, no thyroid nodules or enlargement. ?Neuro: Cranial nerves II-XII with normal function. ?Balance: Normal gate. ?Other findings: none. ? ?Independent Review of  Additional Tests or Records:  ?none ? ?Procedures:  ?Procedure note: Flexible fiberoptic laryngoscopy ? ?Details of the procedure were explained to the patient and all questions were answered.  ? ?Procedure:  ? ?After anesthetizing the nasal cavity with topical lidocaine and oxymetazoline, the flexible endoscope was introduced and passed through the nasal cavity into the nasopharynx. The scope was then advanced to the level of the oropharynx, then the hypopharynx and larynx.  ? ?Findings:  ? ?The posterior soft palate, uvula, tongue base and vallecula were visualized and appeared healthy without mucosal masses or lesions. The epiglottis, aryepiglottic folds, hypopharynx, supraglottis, glottis were visualized and appeared healthy without mucosal masses or lesions. Vocal fold mobility was intact and symmetric.  ? ?Additional findings: Fullness and edema of the arytenoids. ? ?The scope was withdrawn from the nose. He tolerated the procedure well.  ? ?Procedure Note: ? ?Indications for procedure: neck mass ? ?Details of the procedure were discussed with the patient and all questions were answered. ? ?Procedure: ? ?2% xylocaine with epinephrine was infiltrated into the overlying skin. ?First pass was made with a 25 gauge needle and 10 cc syringe. Second pass was made with a 22 gauge needle. Specimen was placed on microscopic slides and air-dried. Additional material was placed in Cytolyte solution for cell block preparation. A third pass was made with a 22 guage needle and sample was added to the cytolyte solution. ? ?A bandage was applied.  ? ?He tolerated the procedure well. Results will be discussed when available. ? ?Impression & Plans:  ?Suspicious neck mass, worrisome for metastatic carcinoma. FNA performed. Primary site not identified on office endoscopy. We will discuss results of the FNA when available next week. ?

## 2022-02-27 ENCOUNTER — Other Ambulatory Visit (HOSPITAL_COMMUNITY)
Admission: RE | Admit: 2022-02-27 | Discharge: 2022-02-27 | Disposition: A | Discharge status: Home / Self Care | Payer: Medicare Other | Source: Ambulatory Visit | Attending: Otolaryngology | Admitting: Otolaryngology

## 2022-02-27 DIAGNOSIS — Z20822 Contact with and (suspected) exposure to covid-19: Secondary | ICD-10-CM | POA: Diagnosis not present

## 2022-02-27 DIAGNOSIS — Z01812 Encounter for preprocedural laboratory examination: Secondary | ICD-10-CM | POA: Insufficient documentation

## 2022-02-27 DIAGNOSIS — Z01818 Encounter for other preprocedural examination: Secondary | ICD-10-CM

## 2022-02-27 LAB — SARS CORONAVIRUS 2 (TAT 6-24 HRS): SARS Coronavirus 2: NEGATIVE

## 2022-03-02 NOTE — Anesthesia Preprocedure Evaluation (Addendum)
Anesthesia Evaluation  ?Patient identified by MRN, date of birth, ID band ?Patient awake ? ? ? ?Reviewed: ?Allergy & Precautions, NPO status , Patient's Chart, lab work & pertinent test results ? ?Airway ?Mallampati: II ? ?TM Distance: >3 FB ?Neck ROM: Full ? ? ? Dental ? ?(+) Dental Advisory Given, Teeth Intact ?  ?Pulmonary ?shortness of breath, COPD, Current Smoker and Patient abstained from smoking.,  ?  ?Pulmonary exam normal ?breath sounds clear to auscultation ? ? ? ? ? ? Cardiovascular ?hypertension, Pt. on medications ?Normal cardiovascular exam ?Rhythm:Regular Rate:Normal ? ? ?  ?Neuro/Psych ?negative neurological ROS ?   ? GI/Hepatic ?negative GI ROS, Neg liver ROS,   ?Endo/Other  ?negative endocrine ROS ? Renal/GU ?negative Renal ROS  ? ?  ?Musculoskeletal ?negative musculoskeletal ROS ?(+)  ? Abdominal ?(+) + obese,   ?Peds ? Hematology ?negative hematology ROS ?(+)   ?Anesthesia Other Findings ? ? Reproductive/Obstetrics ? ?  ? ? ? ? ? ? ? ? ? ? ? ? ? ?  ?  ? ? ? ? ? ? ? ?Anesthesia Physical ?Anesthesia Plan ? ?ASA: 3 ? ?Anesthesia Plan: General  ? ?Post-op Pain Management: Tylenol PO (pre-op)*  ? ?Induction: Intravenous ? ?PONV Risk Score and Plan: 3 and Ondansetron, Dexamethasone and Treatment may vary due to age or medical condition ? ?Airway Management Planned: Oral ETT ? ?Additional Equipment:  ? ?Intra-op Plan:  ? ?Post-operative Plan: Extubation in OR ? ?Informed Consent: I have reviewed the patients History and Physical, chart, labs and discussed the procedure including the risks, benefits and alternatives for the proposed anesthesia with the patient or authorized representative who has indicated his/her understanding and acceptance.  ? ? ? ?Dental advisory given ? ?Plan Discussed with: CRNA ? ?Anesthesia Plan Comments:   ? ? ? ? ? ?Anesthesia Quick Evaluation ? ?

## 2022-03-03 ENCOUNTER — Inpatient Hospital Stay (HOSPITAL_COMMUNITY): Payer: Medicare Other | Admitting: Anesthesiology

## 2022-03-03 ENCOUNTER — Other Ambulatory Visit: Payer: Self-pay

## 2022-03-03 ENCOUNTER — Encounter (HOSPITAL_COMMUNITY): Payer: Self-pay | Admitting: Otolaryngology

## 2022-03-03 ENCOUNTER — Inpatient Hospital Stay (HOSPITAL_COMMUNITY)
Admission: RE | Admit: 2022-03-03 | Discharge: 2022-03-04 | DRG: 822 | Disposition: A | Discharge status: Home / Self Care | Payer: Medicare Other | Attending: Otolaryngology | Admitting: Otolaryngology

## 2022-03-03 ENCOUNTER — Encounter (HOSPITAL_COMMUNITY)
Admission: RE | Disposition: A | Discharge status: Home / Self Care | Payer: Self-pay | Source: Home / Self Care | Attending: Otolaryngology

## 2022-03-03 DIAGNOSIS — E669 Obesity, unspecified: Secondary | ICD-10-CM | POA: Diagnosis present

## 2022-03-03 DIAGNOSIS — F17210 Nicotine dependence, cigarettes, uncomplicated: Secondary | ICD-10-CM | POA: Diagnosis not present

## 2022-03-03 DIAGNOSIS — J449 Chronic obstructive pulmonary disease, unspecified: Secondary | ICD-10-CM | POA: Diagnosis present

## 2022-03-03 DIAGNOSIS — F17290 Nicotine dependence, other tobacco product, uncomplicated: Secondary | ICD-10-CM | POA: Diagnosis present

## 2022-03-03 DIAGNOSIS — Z6831 Body mass index (BMI) 31.0-31.9, adult: Secondary | ICD-10-CM

## 2022-03-03 DIAGNOSIS — C801 Malignant (primary) neoplasm, unspecified: Secondary | ICD-10-CM

## 2022-03-03 DIAGNOSIS — I10 Essential (primary) hypertension: Secondary | ICD-10-CM

## 2022-03-03 DIAGNOSIS — C770 Secondary and unspecified malignant neoplasm of lymph nodes of head, face and neck: Secondary | ICD-10-CM

## 2022-03-03 HISTORY — PX: LARYNGOSCOPY AND ESOPHAGOSCOPY: SHX5660

## 2022-03-03 HISTORY — PX: NASOPHARYNGOSCOPY: SHX5210

## 2022-03-03 HISTORY — PX: RADICAL NECK DISSECTION: SHX2284

## 2022-03-03 SURGERY — LARYNGOSCOPY, WITH ESOPHAGOSCOPY
Anesthesia: General

## 2022-03-03 MED ORDER — GLYCOPYRROLATE PF 0.2 MG/ML IJ SOSY
PREFILLED_SYRINGE | INTRAMUSCULAR | Status: AC
  Filled 2022-03-03: qty 1

## 2022-03-03 MED ORDER — ROCURONIUM BROMIDE 100 MG/10ML IV SOLN
INTRAVENOUS | Status: DC | PRN
  Administered 2022-03-03: 100 mg via INTRAVENOUS
  Administered 2022-03-03: 50 mg via INTRAVENOUS

## 2022-03-03 MED ORDER — ADULT MULTIVITAMIN W/MINERALS CH
1.0000 | ORAL_TABLET | Freq: Every day | ORAL | Status: DC
  Administered 2022-03-04: 1 via ORAL
  Filled 2022-03-03: qty 1

## 2022-03-03 MED ORDER — ACETAMINOPHEN 500 MG PO TABS
1000.0000 mg | ORAL_TABLET | Freq: Once | ORAL | Status: AC
  Administered 2022-03-03: 1000 mg via ORAL
  Filled 2022-03-03: qty 2

## 2022-03-03 MED ORDER — CHLORHEXIDINE GLUCONATE 0.12 % MT SOLN
OROMUCOSAL | Status: AC
  Filled 2022-03-03: qty 15

## 2022-03-03 MED ORDER — CHLORHEXIDINE GLUCONATE 0.12 % MT SOLN: 15.0000 mL | Freq: Once | OROMUCOSAL | Status: DC

## 2022-03-03 MED ORDER — LOSARTAN POTASSIUM 50 MG PO TABS
50.0000 mg | ORAL_TABLET | Freq: Every day | ORAL | Status: DC
  Administered 2022-03-04: 50 mg via ORAL
  Filled 2022-03-03: qty 1

## 2022-03-03 MED ORDER — ROSUVASTATIN CALCIUM 20 MG PO TABS
20.0000 mg | ORAL_TABLET | Freq: Every day | ORAL | Status: DC
  Administered 2022-03-04: 20 mg via ORAL
  Filled 2022-03-03: qty 1

## 2022-03-03 MED ORDER — FENTANYL CITRATE (PF) 250 MCG/5ML IJ SOLN
INTRAMUSCULAR | Status: AC
  Filled 2022-03-03: qty 5

## 2022-03-03 MED ORDER — DEXTROSE-NACL 5-0.9 % IV SOLN: INTRAVENOUS | Status: DC

## 2022-03-03 MED ORDER — HYDROCHLOROTHIAZIDE 12.5 MG PO TABS
12.5000 mg | ORAL_TABLET | Freq: Every day | ORAL | Status: DC
  Administered 2022-03-04: 12.5 mg via ORAL
  Filled 2022-03-03: qty 1

## 2022-03-03 MED ORDER — LIDOCAINE HCL (CARDIAC) PF 100 MG/5ML IV SOSY
PREFILLED_SYRINGE | INTRAVENOUS | Status: DC | PRN
  Administered 2022-03-03: 80 mg via INTRATRACHEAL

## 2022-03-03 MED ORDER — VITAMIN B-12 100 MCG PO TABS
100.0000 ug | ORAL_TABLET | Freq: Every day | ORAL | Status: DC
  Administered 2022-03-04: 100 ug via ORAL
  Filled 2022-03-03: qty 1

## 2022-03-03 MED ORDER — ASPIRIN 81 MG PO CHEW
81.0000 mg | CHEWABLE_TABLET | Freq: Every day | ORAL | Status: DC
  Administered 2022-03-04: 81 mg via ORAL
  Filled 2022-03-03: qty 1

## 2022-03-03 MED ORDER — MIDAZOLAM HCL 2 MG/2ML IJ SOLN
INTRAMUSCULAR | Status: AC
  Filled 2022-03-03: qty 2

## 2022-03-03 MED ORDER — TESTOSTERONE 20.25 MG/ACT (1.62%) TD GEL: 1.0000 | Freq: Every morning | TRANSDERMAL | Status: DC

## 2022-03-03 MED ORDER — SUGAMMADEX SODIUM 200 MG/2ML IV SOLN
INTRAVENOUS | Status: DC | PRN
  Administered 2022-03-03: 200 mg via INTRAVENOUS

## 2022-03-03 MED ORDER — FENTANYL CITRATE (PF) 100 MCG/2ML IJ SOLN: 25.0000 ug | INTRAMUSCULAR | Status: DC | PRN

## 2022-03-03 MED ORDER — ALBUTEROL SULFATE (2.5 MG/3ML) 0.083% IN NEBU: 3.0000 mL | INHALATION_SOLUTION | Freq: Four times a day (QID) | RESPIRATORY_TRACT | Status: DC | PRN

## 2022-03-03 MED ORDER — DROPERIDOL 2.5 MG/ML IJ SOLN: 0.6250 mg | Freq: Once | INTRAMUSCULAR | Status: DC | PRN

## 2022-03-03 MED ORDER — LACTATED RINGERS IV SOLN: INTRAVENOUS | Status: DC | PRN

## 2022-03-03 MED ORDER — ONDANSETRON HCL 4 MG/2ML IJ SOLN
INTRAMUSCULAR | Status: DC | PRN
  Administered 2022-03-03: 4 mg via INTRAVENOUS

## 2022-03-03 MED ORDER — BACITRACIN ZINC 500 UNIT/GM EX OINT
TOPICAL_OINTMENT | CUTANEOUS | Status: AC
  Filled 2022-03-03: qty 28.35

## 2022-03-03 MED ORDER — LOSARTAN POTASSIUM-HCTZ 50-12.5 MG PO TABS: 1.0000 | ORAL_TABLET | Freq: Every morning | ORAL | Status: DC

## 2022-03-03 MED ORDER — 0.9 % SODIUM CHLORIDE (POUR BTL) OPTIME
TOPICAL | Status: DC | PRN
  Administered 2022-03-03: 1000 mL

## 2022-03-03 MED ORDER — MIDAZOLAM HCL 2 MG/2ML IJ SOLN
INTRAMUSCULAR | Status: DC | PRN
  Administered 2022-03-03: 2 mg via INTRAVENOUS

## 2022-03-03 MED ORDER — EPINEPHRINE HCL (NASAL) 0.1 % NA SOLN
NASAL | Status: AC
  Filled 2022-03-03: qty 30

## 2022-03-03 MED ORDER — ORAL CARE MOUTH RINSE: 15.0000 mL | Freq: Once | OROMUCOSAL | Status: DC

## 2022-03-03 MED ORDER — BUDESON-GLYCOPYRROL-FORMOTEROL 160-9-4.8 MCG/ACT IN AERO
2.0000 | INHALATION_SPRAY | Freq: Two times a day (BID) | RESPIRATORY_TRACT | Status: DC
  Filled 2022-03-03: qty 1

## 2022-03-03 MED ORDER — ROCURONIUM BROMIDE 10 MG/ML (PF) SYRINGE
PREFILLED_SYRINGE | INTRAVENOUS | Status: AC
  Filled 2022-03-03: qty 20

## 2022-03-03 MED ORDER — DEXAMETHASONE SODIUM PHOSPHATE 10 MG/ML IJ SOLN
INTRAMUSCULAR | Status: AC
  Filled 2022-03-03: qty 1

## 2022-03-03 MED ORDER — LIDOCAINE 2% (20 MG/ML) 5 ML SYRINGE
INTRAMUSCULAR | Status: AC
  Filled 2022-03-03: qty 5

## 2022-03-03 MED ORDER — ONDANSETRON HCL 4 MG/2ML IJ SOLN: 4.0000 mg | INTRAMUSCULAR | Status: DC | PRN

## 2022-03-03 MED ORDER — ONDANSETRON HCL 4 MG/2ML IJ SOLN
INTRAMUSCULAR | Status: AC
  Filled 2022-03-03: qty 2

## 2022-03-03 MED ORDER — PROPOFOL 10 MG/ML IV BOLUS
INTRAVENOUS | Status: DC | PRN
  Administered 2022-03-03: 120 mg via INTRAVENOUS

## 2022-03-03 MED ORDER — GLYCOPYRROLATE 0.2 MG/ML IJ SOLN
INTRAMUSCULAR | Status: DC | PRN
  Administered 2022-03-03: .2 mg via INTRAVENOUS

## 2022-03-03 MED ORDER — VITAMIN D 25 MCG (1000 UNIT) PO TABS
1000.0000 [IU] | ORAL_TABLET | Freq: Every day | ORAL | Status: DC
Start: 2022-03-04 — End: 2022-03-04
  Administered 2022-03-04: 1000 [IU] via ORAL
  Filled 2022-03-03: qty 1

## 2022-03-03 MED ORDER — FENTANYL CITRATE (PF) 250 MCG/5ML IJ SOLN
INTRAMUSCULAR | Status: DC | PRN
  Administered 2022-03-03: 100 ug via INTRAVENOUS
  Administered 2022-03-03 (×3): 50 ug via INTRAVENOUS
  Administered 2022-03-03 (×2): 100 ug via INTRAVENOUS

## 2022-03-03 MED ORDER — VITAMIN E 45 MG (100 UNIT) PO CAPS
400.0000 [IU] | ORAL_CAPSULE | Freq: Every day | ORAL | Status: DC
  Administered 2022-03-04: 400 [IU] via ORAL
  Filled 2022-03-03: qty 4

## 2022-03-03 MED ORDER — BACITRACIN ZINC 500 UNIT/GM EX OINT
1.0000 "application " | TOPICAL_OINTMENT | Freq: Three times a day (TID) | CUTANEOUS | Status: DC
  Administered 2022-03-03 – 2022-03-04 (×3): 1 via TOPICAL
  Filled 2022-03-03: qty 28.35

## 2022-03-03 MED ORDER — LACTATED RINGERS IV SOLN: INTRAVENOUS | Status: DC

## 2022-03-03 MED ORDER — OXYMETAZOLINE HCL 0.05 % NA SOLN
NASAL | Status: DC | PRN
  Administered 2022-03-03: 1

## 2022-03-03 MED ORDER — PROPOFOL 10 MG/ML IV BOLUS
INTRAVENOUS | Status: AC
  Filled 2022-03-03: qty 20

## 2022-03-03 MED ORDER — DEXAMETHASONE SODIUM PHOSPHATE 10 MG/ML IJ SOLN
INTRAMUSCULAR | Status: DC | PRN
  Administered 2022-03-03: 10 mg via INTRAVENOUS

## 2022-03-03 MED ORDER — ONDANSETRON HCL 4 MG PO TABS: 4.0000 mg | ORAL_TABLET | ORAL | Status: DC | PRN

## 2022-03-03 MED ORDER — OXYMETAZOLINE HCL 0.05 % NA SOLN
NASAL | Status: AC
  Filled 2022-03-03: qty 30

## 2022-03-03 MED ORDER — TAMSULOSIN HCL 0.4 MG PO CAPS
0.4000 mg | ORAL_CAPSULE | Freq: Every day | ORAL | Status: DC
  Administered 2022-03-04: 0.4 mg via ORAL
  Filled 2022-03-03: qty 1

## 2022-03-03 MED ORDER — SILDENAFIL CITRATE 100 MG PO TABS: 100.0000 mg | ORAL_TABLET | Freq: Every day | ORAL | Status: DC | PRN

## 2022-03-03 MED ORDER — HYDROCODONE-ACETAMINOPHEN 5-325 MG PO TABS
1.0000 | ORAL_TABLET | ORAL | Status: DC | PRN
  Administered 2022-03-03: 1 via ORAL
  Filled 2022-03-03: qty 2

## 2022-03-03 MED ORDER — IBUPROFEN 100 MG/5ML PO SUSP
400.0000 mg | Freq: Four times a day (QID) | ORAL | Status: DC | PRN
  Administered 2022-03-03 – 2022-03-04 (×2): 400 mg via ORAL
  Filled 2022-03-03 (×2): qty 20

## 2022-03-03 SURGICAL SUPPLY — 68 items
APPLIER CLIP 9.375 SM OPEN (CLIP)
APR CLP SM 9.3 20 MLT OPN (CLIP)
BAG COUNTER SPONGE SURGICOUNT (BAG) ×4 IMPLANT
BAG SPNG CNTER NS LX DISP (BAG) ×2
BALLN PULM 15 16.5 18X75 (BALLOONS)
BALLOON PULM 15 16.5 18X75 (BALLOONS) IMPLANT
CANISTER SUCT 3000ML PPV (MISCELLANEOUS) ×4 IMPLANT
CLEANER TIP ELECTROSURG 2X2 (MISCELLANEOUS) ×4 IMPLANT
CLIP APPLIE 9.375 SM OPEN (CLIP) IMPLANT
CNTNR URN SCR LID CUP LEK RST (MISCELLANEOUS) IMPLANT
CONT SPEC 4OZ STRL OR WHT (MISCELLANEOUS)
CORD BIPOLAR FORCEPS 12FT (ELECTRODE) ×4 IMPLANT
COVER BACK TABLE 60X90IN (DRAPES) ×4 IMPLANT
COVER MAYO STAND STRL (DRAPES) ×2 IMPLANT
COVER SURGICAL LIGHT HANDLE (MISCELLANEOUS) ×4 IMPLANT
DRAIN CHANNEL 15F RND FF W/TCR (WOUND CARE) ×1 IMPLANT
DRAIN SNY 10 ROU (WOUND CARE) IMPLANT
DRAIN WOUND SNY 15 RND (WOUND CARE) IMPLANT
DRAPE HALF SHEET 40X57 (DRAPES) ×4 IMPLANT
DRAPE INCISE 23X17 IOBAN STRL (DRAPES)
DRAPE INCISE 23X17 STRL (DRAPES) IMPLANT
DRAPE INCISE IOBAN 23X17 STRL (DRAPES) IMPLANT
ELECT COATED BLADE 2.86 ST (ELECTRODE) ×4 IMPLANT
ELECT REM PT RETURN 9FT ADLT (ELECTROSURGICAL) ×3
ELECTRODE REM PT RTRN 9FT ADLT (ELECTROSURGICAL) ×3 IMPLANT
EVACUATOR SILICONE 100CC (DRAIN) ×5 IMPLANT
FORCEPS BIPOLAR SPETZLER 8 1.0 (NEUROSURGERY SUPPLIES) ×4 IMPLANT
GAUZE 4X4 16PLY ~~LOC~~+RFID DBL (SPONGE) ×5 IMPLANT
GAUZE SPONGE 4X4 12PLY STRL (GAUZE/BANDAGES/DRESSINGS) IMPLANT
GAUZE SPONGE 4X4 12PLY STRL LF (GAUZE/BANDAGES/DRESSINGS) ×1 IMPLANT
GLOVE BIO SURGEON STRL SZ 6.5 (GLOVE) ×5 IMPLANT
GLOVE ECLIPSE 7.5 STRL STRAW (GLOVE) ×5 IMPLANT
GOWN STRL REUS W/ TWL LRG LVL3 (GOWN DISPOSABLE) ×6 IMPLANT
GOWN STRL REUS W/TWL LRG LVL3 (GOWN DISPOSABLE) ×6
GUARD TEETH (MISCELLANEOUS) IMPLANT
KIT BASIN OR (CUSTOM PROCEDURE TRAY) ×4 IMPLANT
KIT TURNOVER KIT B (KITS) ×4 IMPLANT
LOCATOR NERVE 3 VOLT (DISPOSABLE) IMPLANT
NDL PRECISIONGLIDE 27X1.5 (NEEDLE) ×3 IMPLANT
NEEDLE PRECISIONGLIDE 27X1.5 (NEEDLE) ×3 IMPLANT
NS IRRIG 1000ML POUR BTL (IV SOLUTION) ×5 IMPLANT
PAD ARMBOARD 7.5X6 YLW CONV (MISCELLANEOUS) ×8 IMPLANT
PATTIES SURGICAL .5 X3 (DISPOSABLE) ×1 IMPLANT
PENCIL FOOT CONTROL (ELECTRODE) ×4 IMPLANT
SHEARS HARMONIC 9CM CVD (BLADE) ×4 IMPLANT
SPECIMEN JAR MEDIUM (MISCELLANEOUS) ×5 IMPLANT
SPECIMEN JAR SMALL (MISCELLANEOUS) IMPLANT
SPONGE INTESTINAL PEANUT (DISPOSABLE) IMPLANT
SPONGE T-LAP 18X18 ~~LOC~~+RFID (SPONGE) IMPLANT
STAPLER VISISTAT 35W (STAPLE) ×4 IMPLANT
SUT CHROMIC 3 0 SH 27 (SUTURE) ×6 IMPLANT
SUT CHROMIC 5 0 P 3 (SUTURE) IMPLANT
SUT ETHILON 3 0 PS 1 (SUTURE) ×5 IMPLANT
SUT ETHILON 5 0 PS 2 18 (SUTURE) IMPLANT
SUT SILK 2 0 REEL (SUTURE) ×1 IMPLANT
SUT SILK 3 0 (SUTURE) ×3
SUT SILK 3 0 SH CR/8 (SUTURE) ×4 IMPLANT
SUT SILK 3-0 18XBRD TIE 12 (SUTURE) IMPLANT
SUT SILK 4 0 REEL (SUTURE) ×6 IMPLANT
SUT VIC AB 3-0 SH 18 (SUTURE) IMPLANT
SYR CONTROL 10ML LL (SYRINGE) IMPLANT
SYR TB 1ML LUER SLIP (SYRINGE) IMPLANT
TOWEL GREEN STERILE FF (TOWEL DISPOSABLE) ×4 IMPLANT
TRAY ENT MC OR (CUSTOM PROCEDURE TRAY) ×4 IMPLANT
TRAY FOLEY MTR SLVR 14FR STAT (SET/KITS/TRAYS/PACK) ×1 IMPLANT
TUBE CONNECTING 12X1/4 (SUCTIONS) ×4 IMPLANT
TUBE FEEDING 10FR FLEXIFLO (MISCELLANEOUS) IMPLANT
WATER STERILE IRR 1000ML POUR (IV SOLUTION) ×3 IMPLANT

## 2022-03-03 NOTE — Anesthesia Postprocedure Evaluation (Signed)
Anesthesia Post Note ? ?Patient: Brad [name] ? ?Procedure(s) Performed: DIRECT LARYNGOSCOPY AND ESOPHAGOSCOPY BIOPSIES AND FROZEN SECTIONS ?NASOPHARYNGOSCOPY ?NECK DISSECTION (Left) ? ?  ? ?Patient location during evaluation: PACU ?Anesthesia Type: General ?Level of consciousness: sedated and patient cooperative ?Pain management: pain level controlled ?Vital Signs Assessment: post-procedure vital signs reviewed and stable ?Respiratory status: spontaneous breathing ?Cardiovascular status: stable ?Anesthetic complications: no ? ? ?No notable events documented. ? ?Last Vitals:  ?Vitals:  ? 03/03/22 1330 03/03/22 1400  ?BP: (!) 149/75 (!) 153/84  ?Pulse: 81 86  ?Resp: 20 20  ?Temp:    ?SpO2: 94% 94%  ?  ?Last Pain:  ?Vitals:  ? 03/03/22 1330  ?PainSc: 2   ? ? ?  ?  ?  ?  ?  ?  ? ?Nolon Nations ? ? ? ? ?

## 2022-03-03 NOTE — Interval H&P Note (Signed)
History and Physical Interval Note: ? ?03/03/2022 ?7:15 AM ? ?[name]  has presented today for surgery, with the diagnosis of Cervical lymphadenopathy.  The various methods of treatment have been discussed with the patient and family. After consideration of risks, benefits and other options for treatment, the patient has consented to  Procedure(s): ?DIRECT LARYNGOSCOPY AND ESOPHAGOSCOPY BIOPSIES AND FROZEN SECTIONS (N/A) ?NASOPHARYNGOSCOPY (N/A) ?NECK DISSECTION (Left) as a surgical intervention.  The patient's history has been reviewed, patient examined, no change in status, stable for surgery.  I have reviewed the patient's chart and labs.  Questions were answered to the patient's satisfaction.   ? ? ?Brad [name] ? ? ?

## 2022-03-03 NOTE — Anesthesia Procedure Notes (Signed)
Procedure Name: Intubation ?Date/Time: 03/03/2022 7:37 AM ?Performed by: Claris Che, CRNA ?Pre-anesthesia Checklist: Patient identified, Emergency Drugs available, Suction available, Patient being monitored and Timeout performed ?Patient Re-evaluated:Patient Re-evaluated prior to induction ?Oxygen Delivery Method: Circle system utilized ?Preoxygenation: Pre-oxygenation with 100% oxygen ?Induction Type: IV induction and Cricoid Pressure applied ?Ventilation: Mask ventilation without difficulty ?Laryngoscope Size: Mac and 4 ?Grade View: Grade II ?Tube type: Oral ?Tube size: 7.5 mm ?Number of attempts: 1 ?Airway Equipment and Method: Stylet ?Placement Confirmation: ETT inserted through vocal cords under direct vision, positive ETCO2 and breath sounds checked- equal and bilateral ?Secured at: 23 cm ?Tube secured with: Tape ?Dental Injury: Teeth and Oropharynx as per pre-operative assessment  ? ? ? ? ?

## 2022-03-03 NOTE — Progress Notes (Signed)
Patient ID: Brad [name], male   DOB: 1951-01-03, 71 y.o.   MRN: [id number] ?Postop check, doing well, minimal pain. ? ?Incision is intact without any swelling or hematoma.  Drain is functioning.  Cranial nerves appear to be working.  Stable postop.  Continue inpatient monitoring. ?

## 2022-03-03 NOTE — Op Note (Signed)
OPERATIVE REPORT ? ?DATE OF SURGERY: 03/03/2022 ? ?PATIENT:  Brad [name],  71 y.o. male ? 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 ?POST-OPERATIVE DIAGNOSIS: Same ? ?PROCEDURE:  Procedure(s): ?DIRECT LARYNGOSCOPY AND ESOPHAGOSCOPY BIOPSIES  ?NASOPHARYNGOSCOPY ?NECK DISSECTION, left ? ?SURGEON:  Beckie Salts, MD ? ?ASSISTANTS: RNFA ? ?ANESTHESIA:   General  ? ?EBL: 250 ml ? ?DRAINS: 15 [place] round JP ? ?LOCAL MEDICATIONS USED:  None ? ?SPECIMEN: 1.  Biopsy left vallecula ?2.  Biopsy left tongue base ?3.  Biopsy left tonsil ?4.  Biopsy left nasopharynx ?5.  Modified radical neck dissection, left, levels 1 through 4 including internal jugular vein. ? ?COUNTS:  Correct ? ?PROCEDURE DETAILS: ?The patient was taken to the operating room and placed on the operating table in the supine position. Following induction of general endotracheal anesthesia, the table was turned 90 degrees and the patient was initially draped for endoscopy.  A maxillary tooth protector was used throughout the procedure. ? ?1.  Esophagoscopy.  A rigid cervical esophagoscope was entered into the oral cavity and into the upper cervical esophagus.  It was passed to the hub and then slowly withdrawn suctioning secretions.  Inspection of the esophageal wall revealed no mucosal masses.  The scope was removed. ? ?2.  Direct laryngoscopy with multiple biopsies.  The Jako laryngoscope was used to view the larynx, hypopharynx, oropharynx.  The supraglottic glottic and subglottic larynx was all clear.  The postcricoid region was clear.  The piriform sinuses were clear.  Directed random biopsies were taken from the left vallecula, left base of tongue and the left tonsil.  Palpation of the pharynx also revealed no palpable mass. ? ?3.  Nasopharyngoscopy with biopsy of nasopharynx.  A 0 degree endoscope was used through the left nasal cavity after topical Afrin was applied.  The scope was advanced into the  nasopharynx.  Mucosa was all thickened but there was no mucosal masses identified and there was no ulceration.  Random biopsy was taken from the left side nasopharynx. ? ?4.  Modified radical neck dissection, left.  A left Hemi apron incision was outlined with a marking pen.  Electrocautery was used to incise the skin and subcutaneous tissue and through the platysma layer.  Subplatysmal flap was developed superiorly to the mandible and inferiorly to the clavicle.  Stay sutures were used on the upper flap.  Level 1 was dissected first.  The marginal branch of the facial nerve was identified and dissected out reflected superiorly.  The facial vessels were then separately identified ligated between clamps and divided.  4 silk ties were used.  The submandibular triangle contents were then brought inferiorly.  The mylohyoid was identified and reflected anteriorly.  The submental fat pad was cleaned off of the digastric and brought posteriorly.  The lingual nerve was identified and preserved, the ganglion was sacrificed and divided between clamps and ligated..  The duct was treated in a similar fashion.  The lingual artery was then clamped and ligated.  Contents of the submandibular triangle were then brought posteriorly off of the digastric.  The right 9 veins were ligated between clamps and divided as the level 2 node was up against these veins.  Attention was then placed to the upper level 2 area.  The external jugular vein was preserved.  All the fibrofatty tissue anterior to that was brought forward off the sternocleidomastoid muscle.  A cuff of SCM muscle was taken with the specimen as it was adherent  to the major lymph node.  Dissection was continued down posterior to the muscle.  The spinal sensory nerve and cervical plexus was identified.  The fibrofatty tissue was dissected forward off of this.  Lymph node bearing tissue posterior superior to the spinal accessory nerve was dissected down to the level of the  splenius capitis and levator scapular muscle fascia.  These were brought under the nerve and brought forward with the rest of the specimen.  As the specimen was brought forward the internal jugular vein was identified and was felt to be adherent to the node so decision was made to sacrifice the vein.  The upper stump was doubly ligated using 2-0 silk free tie and a 3-0 silk suture ligature.  The vagus nerve and carotids were identified and all of the lymph node bearing tissue was dissected down along the nerve and the vessels.  The lower jugular was dissected free and ligated between clamps in a similar fashion as the upper stump.  There were several lymphatics identified in this area that were also ligated with 4-0 silk ties.  The specimen was delivered oriented on a towel and sent for pathologic evaluation.  Valsalva maneuver was used to assure no bleeding or lymphatic drainage.  The wound was irrigated with saline.  The drain was exited through a separate stab incision secured in place with nylon suture.  The platysma layer was reapproximated with interrupted 3-0 chromic and skin staples were used on the skin.  The drain was charged.  Bacitracin was applied to the staple line.  Patient was awakened extubated and transferred to recovery in stable condition. ? ? ?PATIENT DISPOSITION:  To PACU, stable ? ? ? ?

## 2022-03-03 NOTE — Transfer of Care (Signed)
Immediate Anesthesia Transfer of Care Note ? ?Patient: KYDAN SHANHOLTZER ? ?Procedure(s) Performed: DIRECT LARYNGOSCOPY AND ESOPHAGOSCOPY BIOPSIES AND FROZEN SECTIONS ?NASOPHARYNGOSCOPY ?NECK DISSECTION (Left) ? ?Patient Location: PACU ? ?Anesthesia Type:General ? ?Level of Consciousness: alert , drowsy, patient cooperative and responds to stimulation ? ?Airway & Oxygen Therapy: Patient Spontanous Breathing and Patient connected to nasal cannula oxygen ? ?Post-op Assessment: Report given to RN, Post -op Vital signs reviewed and stable and Patient moving all extremities X 4 ? ?Post vital signs: Reviewed and stable ? ?Last Vitals:  ?Vitals Value Taken Time  ?BP 148/64 03/03/22 1109  ?Temp    ?Pulse 73 03/03/22 1112  ?Resp 16 03/03/22 1112  ?SpO2 95 % 03/03/22 1112  ?Vitals shown include unvalidated device data. ? ?Last Pain:  ?Vitals:  ? 03/03/22 0558  ?PainSc: 0-No pain  ?   ? ?  ? ?Complications: No notable events documented. ?

## 2022-03-04 ENCOUNTER — Encounter (HOSPITAL_COMMUNITY): Payer: Self-pay | Admitting: Otolaryngology

## 2022-03-04 MED ORDER — HYDROCODONE-ACETAMINOPHEN 7.5-325 MG PO TABS: 1.0000 | ORAL_TABLET | Freq: Four times a day (QID) | ORAL | 0 refills | Status: DC | PRN

## 2022-03-04 MED ORDER — ONDANSETRON 8 MG PO TBDP: 8.0000 mg | ORAL_TABLET | Freq: Three times a day (TID) | ORAL | 1 refills | Status: DC | PRN

## 2022-03-04 NOTE — Progress Notes (Signed)
Patient ID: TERRE ZABRISKIE, male   DOB: [birth year], 71 y.o.   MRN: [id number] ?Subjective: ?No complaints, no problems overnight, minimal pain.  Eating well. ? ?Objective: ?Vital signs in last 24 hours: ?Temp:  [98 ?F (36.7 ?C)-99.1 ?F (37.3 ?C)] 98 ?F (36.7 ?C) (04/15 0750) ?Pulse Rate:  [66-87] 66 (04/15 0750) ?Resp:  [15-20] 17 (04/15 0750) ?BP: (132-160)/(59-84) 140/67 (04/15 0750) ?SpO2:  [92 %-95 %] 92 % (04/15 0750) ?Weight change:  ?Last BM Date : 03/02/22 ? ?Intake/Output from previous day: ?04/14 0701 - 04/15 0700 ?In: 1586.4 [I.V.:1586.4] ?Out: 633 [Urine:285; Drains:98; Blood:250] ?Intake/Output this shift: ?No intake/output data recorded. ? ?PHYSICAL EXAM: ?Breathing and voice are clear.  Neck looks excellent.  Drain is holding a seal. ? ?Lab Results: ?No results for input(s): WBC, HGB, HCT, PLT in the last 72 hours. ?BMET ?No results for input(s): NA, K, CL, CO2, GLUCOSE, BUN, CREATININE, CALCIUM in the last 72 hours. ? ?Studies/Results: ?No results found. ? ?Medications: I have reviewed the patient's current medications. ? ?Assessment/Plan: ?Stable postop.  He would like to go home with the drain.  I think that is reasonable.  We will we will have him follow-up on Monday for drain removal. ? LOS: 1 day  ? ? ? ?Izora Gala ?03/04/2022, 7:57 AM ? ? ?

## 2022-03-04 NOTE — Discharge Instructions (Signed)
There is prescription pain medicine and antinausea medicine sent to your pharmacy which you may or may not need. ? ?It is okay to use ibuprofen (Advil, Motrin) 200 mg tablets, 3 tablets every 6 hours or up to 4 tablets every 8 hours. ? ?Avoid any strenuous activity.  Eat and drink what you would like.  Keep the neck dry.  It is okay to shower but keep the shower away from your neck. ?

## 2022-03-04 NOTE — Discharge Summary (Signed)
Physician Discharge Summary  ?Patient ID: ?Brad [name] ?MRN: [id number] ?DOB/AGE: 26-Jul-1951 71 y.o. ? ?Admit date: 03/03/2022 ?Discharge date: 03/04/2022 ? ?Admission Diagnoses: Metastatic squamous cell 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 ? ?Discharge Diagnoses:  ?Principal Problem: ?  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 ([place]) ? ? ?Discharged Condition: good ? ?Hospital Course: No complications ? ?Consults: none ? ?Significant Diagnostic Studies: none ? ?Treatments: surgery: Upper aerodigestive tract endoscopy and biopsies and modified neck dissection ? ?Discharge Exam: ?Blood pressure 140/67, pulse 66, temperature 98 ?F (36.7 ?C), temperature source Oral, resp. rate 17, height '5\' 8"'$  (1.727 m), weight 95.3 kg, SpO2 92 %. ?PHYSICAL EXAM: ?Awake and alert, breathing and voice are clear.  Neck incision looks excellent with staples intact.  No swelling.  Drain is functioning. ? ?Disposition: Discharge disposition: 01-Home or Self Care ? ? ? ? ? ? ?Discharge Instructions   ? ? Diet - low sodium heart healthy   Complete by: As directed ?  ? Discharge wound care:   Complete by: As directed ?  ? Apply antibiotic ointment to the incision line and drain site twice daily.  Empty the drain, record amount, and recharge drain every 8 hours.  ? Increase activity slowly   Complete by: As directed ?  ? ?  ? ?Allergies as of 03/04/2022   ?No Known Allergies ?  ? ?  ?Medication List  ?  ? ?TAKE these medications   ? ?albuterol 108 (90 Base) MCG/ACT inhaler ?Commonly known as: VENTOLIN HFA ?Inhale 2 puffs into the lungs every 6 (six) hours as needed. shortness of breath ?  ?aspirin 81 MG chewable tablet ?Chew 81 mg by mouth in the morning. ?  ?Breztri Aerosphere 160-9-4.8 MCG/ACT Aero ?Generic drug: Budeson-Glycopyrrol-Formoterol ?Inhale 2 puffs into the lungs in the morning and at bedtime. ?  ?HYDROcodone-acetaminophen 7.5-325 MG tablet ?Commonly known as: Norco ?Take 1 tablet by mouth every 6 (six) hours as needed for moderate pain. ?   ?losartan-hydrochlorothiazide 50-12.5 MG tablet ?Commonly known as: HYZAAR ?Take 1 tablet by mouth in the morning. ?  ?multivitamin with minerals Tabs tablet ?Take 1 tablet by mouth in the morning. ?  ?ondansetron 8 MG disintegrating tablet ?Commonly known as: ZOFRAN-ODT ?Take 1 tablet (8 mg total) by mouth every 8 (eight) hours as needed for nausea or vomiting. ?  ?rosuvastatin 20 MG tablet ?Commonly known as: CRESTOR ?Take 20 mg by mouth in the morning. ?  ?sildenafil 100 MG tablet ?Commonly known as: VIAGRA ?Take 100 mg by mouth daily as needed for erectile dysfunction. ?  ?tamsulosin 0.4 MG Caps capsule ?Commonly known as: FLOMAX ?Take 0.4 mg by mouth in the morning. ?  ?Testosterone 20.25 MG/ACT (1.62%) Gel ?Apply 1 Pump topically in the morning. ?  ?VITAMIN B-12 PO ?Take 1 tablet by mouth in the morning. ?  ?VITAMIN D3 PO ?Take 1 tablet by mouth in the morning. ?  ?vitamin E 180 MG (400 UNITS) capsule ?Take 400 Units by mouth in the morning. ?  ? ?  ? ?  ?  ? ? ?  ?Discharge Care Instructions  ?(From admission, onward)  ?  ? ? ?  ? ?  Start     Ordered  ? 03/04/22 0000  Discharge wound care:       ?Comments: Apply antibiotic ointment to the incision line and drain site twice daily.  Empty the drain, record amount, and recharge drain every 8 hours.  ? 03/04/22 0800  ? ?  ?  ? ?  ? ?  Follow-up Information   ? ? Izora Gala, MD Follow up on 03/06/2022.   ?Specialty: Otolaryngology ?Why: Come to Dr. Janeice Robinson office at 1030 Monday morning. ?Contact information: ?[address] ?[address] ?[place] [address] ?[phone] ? ? ?  ?  ? ?  ?  ? ?  ? ? ?Signed: ?Izora Gala ?03/04/2022, 8:02 AM ? ? ? ?

## 2022-03-10 NOTE — Progress Notes (Signed)
Oncology Nurse Navigator Documentation  ? ?I called Brad [name] to update him on his plan of care. I've made him aware that he has been referred to Dr. Rozell Searing for a pre-radiation dental referral and once he has been cleared we will set up a follow up appointment with Dr. Isidore Moos and CT simulation planning. He voiced his understanding and knows to call me if he has any questions. ? ?I also contacted pathology to add EBV and p16 testing to his 4/14 biopsy. ? ?Harlow Asa RN, BSN, OCN ?Head & Neck Oncology Nurse Navigator ?[place] [facility] ?Phone # [phone]  ?Fax # [phone]  ?

## 2022-03-14 LAB — SURGICAL PATHOLOGY

## 2022-03-29 NOTE — Progress Notes (Signed)
Head and Neck Cancer Location of Tumor / Histology:  ?Malignant neoplasm of lymph nodes of head, face and neck, p16(+) EBV (-) ? ?Biopsies revealed:  ?03/03/2022 ?FINAL MICROSCOPIC DIAGNOSIS:  ?A. VALLACULA, LEFT, BIOPSY:  ?- Benign squamous mucosa and tonsillar tissue  ?- Negative for carcinoma  ?B. TONGUE, LEFT BASE, BIOPSY:  ?- Benign squamous mucosa and underlying fibroconnective tissue  ?- Negative for carcinoma  ?C. TONSIL, LEFT, BIOPSY:  ?- Benign squamous mucosa and underlying fibroconnective tissue  ?- Negative for carcinoma  ?D. NASOPHARYNX, LEFT, BIOPSY:  ?- Benign sinonasal mucosa  ?- Negative for carcinoma  ?E. NECK, LEFT INFERIOR JUGULAR VEIN, MODIFIED RADICAL DISSECTION:  ?- Metastatic squamous cell carcinoma involving three of twenty-three lymph nodes (3/23) - 2 lymph nodes from level II and 1 lymph node from level III are involved by carcinoma  ?- No evidence of extranodal extension  ?- Portion of jugular vein with thrombus, negative for carcinoma  ?- Benign salivary gland, negative for carcinoma ?ADDENDUM:  ?E.   Immunohistochemical stain for p16 is positive in the tumor cells. In situ hybridization for EBV (EBER) is negative. ? ?Nutrition Status Yes No Comments  ?Weight changes? '[]'$  '[x]'$    ?Swallowing concerns? '[]'$  '[x]'$  Reports he is able to eat and drink without difficulty   ?PEG? '[]'$  '[x]'$    ? ?Referrals Yes No Comments  ?Social Work? '[x]'$  '[]'$    ?Dentistry? '[x]'$  '[]'$    ?Swallowing therapy? '[x]'$  '[]'$    ?Nutrition? '[x]'$  '[]'$    ?Med/Onc? '[]'$  '[x]'$  Saw Dr. Chryl Heck on 01/31/22; determined there is no role for chemotherapy currently  ? ?Safety Issues Yes No Comments  ?Prior radiation? '[]'$  '[x]'$    ?Pacemaker/ICD? '[]'$  '[x]'$    ?Possible current pregnancy? '[]'$  '[x]'$  N/A  ?Is the patient on methotrexate? '[]'$  '[x]'$    ? ?Tobacco/Marijuana/Snuff/ETOH use: Continues to smoke and drink alcohol occasionally. Denies any recreational drug use ? ?Past/Anticipated interventions by otolaryngology, if any:  ?03/13/2022 ?--Dr. Izora Gala (office  visit) ?Return visit. Doing well, no complaints. Surgical site looks excellent. Staples removed. No signs of infection. Recheck again in 3 weeks. We will get him back to the cancer center for consideration of radiation therapy. ? ?03/03/2022 ?--Dr. Izora Gala ?DIRECT LARYNGOSCOPY AND ESOPHAGOSCOPY BIOPSIES  ?NASOPHARYNGOSCOPY ?NECK DISSECTION (left) ? ?Past/Anticipated interventions by medical oncology, if any:  ?Systemic therapy not indicated at this time ? ?Current Complaints / other details:  Nothing else of note ? ? ? ? ?

## 2022-03-30 NOTE — Progress Notes (Addendum)
?Radiation Oncology         [phone] ?________________________________ ? ?Follow-up New Visit  ? ?Outpatient  ? ?Name: Brad [name] MRN: [id number]  ?Date: 03/31/2022  DOB: [birth year] ? ?[id number], Dibas, MD  Izora Gala, MD  ? ?REFERRING PHYSICIAN: Izora Gala, MD ? ?DIAGNOSIS:  ?  ICD-10-CM   ?1. Secondary and unspecified malignant neoplasm of lymph nodes of head, face and neck ([facility])  C77.0   ?  ?2. 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 ([place])  C77.0   ?  ? ? Cancer Staging  ?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 ([place]) ?Staging form: Pharynx - HPV-Mediated Oropharynx, AJCC 8th Edition ?- Pathologic stage from 03/31/2022: Stage I (pT0, pN1, cM0, p16+) - Signed by Eppie Gibson, MD on 03/31/2022 ?Stage prefix: Initial diagnosis ? ?Left neck mass - metastatic squamous cell carcinoma, now with biopsy proven cervical lymph node metastasis; p16+; EBV-  ? ?CHIEF COMPLAINT: Here to discuss management of neck cancer ? ?Narrative / Interval History: The patient returns today for further discussion regarding radiation therapy in management of his metastatic SCC/ left neck mass. To review from out initial consultation on 01/30/22: we discussed how RT would be dependant on results from his biopsies/surgery, and how pathology would provide more information on staging and potentially the primary site of his cancer. We also discussed the need for HPV and EBV testing to help [place] understand the likelihood of primaries in the throat. And I informed the patient that I would discuss his post-op results at the tumor board.  ? ?Accordingly, the patient proceeded with laryngeal biopsies and left jugular vein neck dissection on 03/03/22 under the care of Dr. Constance Holster. Pathology from the procedure revealed :  ?-- Left neck-inferior jugular vein dissection: Metastatic squamous cell carcinoma involving 3/23 lymph nodes, with 2 lymph nodes from level II and 1 lymph node from level III involved by carcinoma (p16 positive; EBV  negative). No evidence of extranodal extension; jugular vein negative for carcinoma; salivary gland negative for carcinoma.  ?-- Biopsies of the vallecula, left tongue base, left tonsil, and nasopharynx negative for carcinoma.  ? ?During a post-op follow up with Dr. Fredric Dine on 03/06/22, the patient was noted to be doing well with minimal pain, well controlled with Motrin. He endorsed a small amount of weakness with left shoulder elevation and peri-incisional numbness. The patient also had his drain removed at the time of this visit. The patient was also noted to be doing well from a post-op standpoint during his most recent follow-up with Dr. Constance Holster on 03/13/22. ? ?He is still smoking.  He drinks alcohol occasionally.  He reports he had some transient lymphedema in his anterior neck which has resolved for the most part since surgery.  He is swallowing well ? ?Wt Readings from Last 3 Encounters:  ?03/31/22 213 lb 6 oz (96.8 kg)  ?03/03/22 210 lb (95.3 kg)  ?02/22/22 210 lb 8 oz (95.5 kg)  ? ? ? ?Initial Consult HPI 01/30/22 ::Brad [name] is a 71 y.o. male who presented to his PCP, Dr. Dorthy Cooler, on 01/05/22 with left neck lymphadenopathy. Neck CT performed for evaluation on that same date revealed an enlarged and heterogeneous left level 2 lymph node, measuring 5.0 x 2.8 cm, noted as highly suspicious for nodal metastatic disease. No definite primary neoplasm was otherwise identified within the oral cavity, pharynx or larynx, or any other enlarged or suspicious lymph nodes elsewhere within the neck.  ? ?Subsequently, the patient was referred to Dr. Constance Holster  on 01/06/22 for further evaluation of the left neck lump. During this visit, the patient reported first noticing the left neck mass 2 days prior to presentation, and denied any symptoms related to it. Subsequently, Dr. Constance Holster performed on laryngoscopy on this same date which revealed no abnormal findings. FNA of left neck mass was also performed during this visit  which revealed findings consistent with squamous cell carcinoma.  ? ?Pertinent imaging thus far includes a PET scan performed on 01/25/22 revealing fairly symmetric hypermetabolic activity in the lingual tonsil region, with an SUV max of 7.9 on the left, and an SUV max of 4.9 on the right. No clear primary mass lesion was identified. PET also showed an enlarged hypermetabolic left level 2 lymph node measuring 24 mm in the short axis, though >3cm in greatest dimension to my eye, with an SUV max of 15.7. No additional enlarged or hypermetabolic cervical lymph nodes were appreciated. No signs of distant metastatic disease were appreciated either.  Bilateral lingual tonsil SUV uptake. ? ?Dr. Constance Holster contacted the patient by telephone on 03/09 and discussed the results of the PET scan. Ultimately, Dr. Constance Holster recommends proceeding with exam under anesthesia, directed biopsies, and modified neck dissection. ?  ? ?PREVIOUS RADIATION THERAPY: No ? ?PAST MEDICAL HISTORY:  has a past medical history of Allergy, Bronchitis, Cancer ([place]), Constipation, COPD (chronic obstructive pulmonary disease) ([place]), Dyspnea, Hypercholesteremia, and Hypertension.   ? ?PAST SURGICAL HISTORY: ?Past Surgical History:  ?Procedure Laterality Date  ? COLONOSCOPY    ? FRACTURE SURGERY    ? right wrist  ? LARYNGOSCOPY AND ESOPHAGOSCOPY N/A 03/03/2022  ? Procedure: DIRECT LARYNGOSCOPY AND ESOPHAGOSCOPY BIOPSIES AND FROZEN SECTIONS;  Surgeon: Izora Gala, MD;  Location: [place];  Service: ENT;  Laterality: N/A;  ? Lipo Suction    ? NASOPHARYNGOSCOPY N/A 03/03/2022  ? Procedure: NASOPHARYNGOSCOPY;  Surgeon: Izora Gala, MD;  Location: [place];  Service: ENT;  Laterality: N/A;  ? POLYPECTOMY    ? RADICAL NECK DISSECTION Left 03/03/2022  ? Procedure: NECK DISSECTION;  Surgeon: Izora Gala, MD;  Location: [place];  Service: ENT;  Laterality: Left;  ? ? ?FAMILY HISTORY: family history includes Cancer in his brother. ? ?SOCIAL HISTORY:  reports that he has been  smoking cigarettes. He has never used smokeless tobacco. He reports current alcohol use of about 7.0 standard drinks per week. He reports that he does not use drugs. ? ?ALLERGIES: Patient has no known allergies. ? ?MEDICATIONS:  ?Current Outpatient Medications  ?Medication Sig Dispense Refill  ? albuterol (PROVENTIL HFA;VENTOLIN HFA) 108 (90 BASE) MCG/ACT inhaler Inhale 2 puffs into the lungs every 6 (six) hours as needed. shortness of breath    ? aspirin 81 MG chewable tablet Chew 81 mg by mouth in the morning.    ? Budeson-Glycopyrrol-Formoterol (BREZTRI AEROSPHERE) 160-9-4.8 MCG/ACT AERO Inhale 2 puffs into the lungs in the morning and at bedtime.    ? Cholecalciferol (VITAMIN D3 PO) Take 1 tablet by mouth in the morning.    ? Cyanocobalamin (VITAMIN B-12 PO) Take 1 tablet by mouth in the morning.    ? HYDROcodone-acetaminophen (NORCO) 7.5-325 MG tablet Take 1 tablet by mouth every 6 (six) hours as needed for moderate pain. 20 tablet 0  ? losartan-hydrochlorothiazide (HYZAAR) 50-12.5 MG tablet Take 1 tablet by mouth in the morning.    ? Multiple Vitamin (MULTIVITAMIN WITH MINERALS) TABS Take 1 tablet by mouth in the morning.    ? ondansetron (ZOFRAN-ODT) 8 MG disintegrating tablet Take 1 tablet (  8 mg total) by mouth every 8 (eight) hours as needed for nausea or vomiting. 20 tablet 1  ? rosuvastatin (CRESTOR) 20 MG tablet Take 20 mg by mouth in the morning.    ? sildenafil (VIAGRA) 100 MG tablet Take 100 mg by mouth daily as needed for erectile dysfunction.    ? tamsulosin (FLOMAX) 0.4 MG CAPS capsule Take 0.4 mg by mouth in the morning.    ? Testosterone 20.25 MG/ACT (1.62%) GEL Apply 1 Pump topically in the morning.    ? vitamin E 400 UNIT capsule Take 400 Units by mouth in the morning.    ? ?No current facility-administered medications for this encounter.  ? ? ?REVIEW OF SYSTEMS:  Notable for that above. ?  ?PHYSICAL EXAM:  height is '5\' 8"'$  (1.727 m) and weight is 213 lb 6 oz (96.8 kg). His temporal temperature  is 97.3 ?F (36.3 ?C) (abnormal). His blood pressure is 127/72 and his pulse is 70. His respiration is 18 and oxygen saturation is 96%.   ?General: Alert and oriented, in no acute distress ?HEENT: Head is normocephali

## 2022-03-31 ENCOUNTER — Ambulatory Visit
Admission: RE | Admit: 2022-03-31 | Discharge: 2022-03-31 | Disposition: A | Discharge status: Home / Self Care | Payer: Medicare Other | Source: Ambulatory Visit | Attending: Radiation Oncology | Admitting: Radiation Oncology

## 2022-03-31 ENCOUNTER — Encounter: Payer: Self-pay | Admitting: Radiation Oncology

## 2022-03-31 ENCOUNTER — Other Ambulatory Visit: Payer: Self-pay

## 2022-03-31 VITALS — BP 127/72 | HR 70 | Temp 97.3°F | Resp 18 | Ht 68.0 in | Wt 213.4 lb

## 2022-03-31 DIAGNOSIS — Z79899 Other long term (current) drug therapy: Secondary | ICD-10-CM | POA: Insufficient documentation

## 2022-03-31 DIAGNOSIS — R5381 Other malaise: Secondary | ICD-10-CM

## 2022-03-31 DIAGNOSIS — F17210 Nicotine dependence, cigarettes, uncomplicated: Secondary | ICD-10-CM | POA: Insufficient documentation

## 2022-03-31 DIAGNOSIS — J449 Chronic obstructive pulmonary disease, unspecified: Secondary | ICD-10-CM | POA: Diagnosis not present

## 2022-03-31 DIAGNOSIS — C801 Malignant (primary) neoplasm, unspecified: Secondary | ICD-10-CM | POA: Diagnosis present

## 2022-03-31 DIAGNOSIS — E7800 Pure hypercholesterolemia, unspecified: Secondary | ICD-10-CM | POA: Insufficient documentation

## 2022-03-31 DIAGNOSIS — C770 Secondary and unspecified malignant neoplasm of lymph nodes of head, face and neck: Secondary | ICD-10-CM

## 2022-03-31 DIAGNOSIS — I10 Essential (primary) hypertension: Secondary | ICD-10-CM | POA: Diagnosis not present

## 2022-03-31 DIAGNOSIS — Z510 Encounter for antineoplastic radiation therapy: Secondary | ICD-10-CM | POA: Insufficient documentation

## 2022-03-31 DIAGNOSIS — Z7982 Long term (current) use of aspirin: Secondary | ICD-10-CM | POA: Diagnosis not present

## 2022-03-31 LAB — TSH: TSH: 1.955 u[IU]/mL (ref 0.350–4.500)

## 2022-03-31 NOTE — Progress Notes (Signed)
Oncology Nurse Navigator Documentation  ? ?I met with Brad [name] during his follow up visit with Dr. Isidore Moos today to discuss his pending radiation treatments for his Oropharynx cancer diagnosis. I answered his questions to the best of my ability and then he was able to discuss further concerns/questions with Dr. Isidore Moos.  ? ? ?To provide continued support, encouragement, I accompanied him to his CT simulation appointment. He tolerated procedure without difficulty, denied questions/concerns.   ?I ? toured him to the [facility] treatment area, explained procedures for lobby registration, arrival to Radiation Waiting, and arrival to treatment area.  He then proceeded to check in for his TSH lab.  ?I encouraged him to call me prior to his 04/12/22 [facility].  ? ?Harlow Asa RN, BSN, OCN ?Head & Neck Oncology Nurse Navigator ?[place] at [facility] ?Phone # [phone]  ?Fax # [phone]  ?

## 2022-03-31 NOTE — Addendum Note (Signed)
Encounter addended by: Eppie Gibson, MD on: 03/31/2022 5:33 PM ? Actions taken: Clinical Note Signed

## 2022-04-11 DIAGNOSIS — Z510 Encounter for antineoplastic radiation therapy: Secondary | ICD-10-CM | POA: Diagnosis not present

## 2022-04-12 ENCOUNTER — Other Ambulatory Visit: Payer: Self-pay

## 2022-04-12 ENCOUNTER — Ambulatory Visit
Admission: RE | Admit: 2022-04-12 | Discharge: 2022-04-12 | Disposition: A | Discharge status: Home / Self Care | Payer: Medicare Other | Source: Ambulatory Visit | Attending: Radiation Oncology | Admitting: Radiation Oncology

## 2022-04-12 DIAGNOSIS — C770 Secondary and unspecified malignant neoplasm of lymph nodes of head, face and neck: Secondary | ICD-10-CM

## 2022-04-12 DIAGNOSIS — Z510 Encounter for antineoplastic radiation therapy: Secondary | ICD-10-CM | POA: Diagnosis not present

## 2022-04-12 LAB — RAD ONC ARIA SESSION SUMMARY
Course Elapsed Days: 0
Plan Fractions Treated to Date: 1
Plan Prescribed Dose Per Fraction: 2 Gy
Plan Total Fractions Prescribed: 30
Plan Total Prescribed Dose: 60 Gy
Reference Point Dosage Given to Date: 2 Gy
Reference Point Session Dosage Given: 2 Gy
Session Number: 1

## 2022-04-12 NOTE — Progress Notes (Signed)
Oncology Nurse Navigator Documentation   To provide support, encouragement and care continuity, met with Brad [name] for his initial RT.  Brad [name] completed treatment without difficulty, denied questions/concerns. I reviewed the registration/arrival procedure for subsequent treatments. I discussed head and neck MDC with him and he is agreeable to attend 6/1 at 8:00. I encouraged him to call me with questions/concerns as tmts proceed.   Harlow Asa RN, BSN, OCN Head & Neck Oncology Nurse [place] at [facility] Phone # [phone]  Fax # [phone]

## 2022-04-13 ENCOUNTER — Ambulatory Visit
Admission: RE | Admit: 2022-04-13 | Discharge: 2022-04-13 | Disposition: A | Discharge status: Home / Self Care | Payer: Medicare Other | Source: Ambulatory Visit | Attending: Radiation Oncology | Admitting: Radiation Oncology

## 2022-04-13 ENCOUNTER — Other Ambulatory Visit: Payer: Self-pay

## 2022-04-13 DIAGNOSIS — Z510 Encounter for antineoplastic radiation therapy: Secondary | ICD-10-CM | POA: Diagnosis not present

## 2022-04-13 LAB — RAD ONC ARIA SESSION SUMMARY
Course Elapsed Days: 1
Plan Fractions Treated to Date: 2
Plan Prescribed Dose Per Fraction: 2 Gy
Plan Total Fractions Prescribed: 30
Plan Total Prescribed Dose: 60 Gy
Reference Point Dosage Given to Date: 4 Gy
Reference Point Session Dosage Given: 2 Gy
Session Number: 2

## 2022-04-14 ENCOUNTER — Encounter: Payer: Self-pay | Admitting: Licensed Clinical Social Worker

## 2022-04-14 ENCOUNTER — Ambulatory Visit
Admission: RE | Admit: 2022-04-14 | Discharge: 2022-04-14 | Disposition: A | Discharge status: Home / Self Care | Payer: Medicare Other | Source: Ambulatory Visit | Attending: Radiation Oncology | Admitting: Radiation Oncology

## 2022-04-14 ENCOUNTER — Other Ambulatory Visit: Payer: Self-pay

## 2022-04-14 DIAGNOSIS — Z510 Encounter for antineoplastic radiation therapy: Secondary | ICD-10-CM | POA: Diagnosis not present

## 2022-04-14 LAB — RAD ONC ARIA SESSION SUMMARY
Course Elapsed Days: 2
Plan Fractions Treated to Date: 3
Plan Prescribed Dose Per Fraction: 2 Gy
Plan Total Fractions Prescribed: 30
Plan Total Prescribed Dose: 60 Gy
Reference Point Dosage Given to Date: 6 Gy
Reference Point Session Dosage Given: 2 Gy
Session Number: 3

## 2022-04-14 NOTE — Progress Notes (Signed)
[place] Work  Clinical Social Work was referred by Art therapist for assessment of psychosocial needs.  Clinical Social Worker contacted patient by phone  to offer support and assess for needs.    CSW informed patient of the support team and support services at [facility].  Pt denied any resource or support needs at this time. CSW will plan to meet with patient during [place] to provide support calendar.    [place], [place] Worker [employer]

## 2022-04-18 ENCOUNTER — Other Ambulatory Visit: Payer: Self-pay

## 2022-04-18 ENCOUNTER — Other Ambulatory Visit: Payer: Self-pay | Admitting: Radiation Oncology

## 2022-04-18 ENCOUNTER — Ambulatory Visit
Admission: RE | Admit: 2022-04-18 | Discharge: 2022-04-18 | Disposition: A | Discharge status: Home / Self Care | Payer: Medicare Other | Source: Ambulatory Visit | Attending: Radiation Oncology | Admitting: Radiation Oncology

## 2022-04-18 DIAGNOSIS — Z510 Encounter for antineoplastic radiation therapy: Secondary | ICD-10-CM | POA: Diagnosis not present

## 2022-04-18 DIAGNOSIS — C770 Secondary and unspecified malignant neoplasm of lymph nodes of head, face and neck: Secondary | ICD-10-CM

## 2022-04-18 LAB — RAD ONC ARIA SESSION SUMMARY
Course Elapsed Days: 6
Plan Fractions Treated to Date: 4
Plan Prescribed Dose Per Fraction: 2 Gy
Plan Total Fractions Prescribed: 30
Plan Total Prescribed Dose: 60 Gy
Reference Point Dosage Given to Date: 8 Gy
Reference Point Session Dosage Given: 2 Gy
Session Number: 4

## 2022-04-18 MED ORDER — NICOTINE 21 MG/24HR TD PT24: 21.0000 mg | MEDICATED_PATCH | Freq: Every day | TRANSDERMAL | 2 refills | Status: DC

## 2022-04-18 MED ORDER — BUPROPION HCL ER (SR) 150 MG PO TB12: ORAL_TABLET | ORAL | 2 refills | Status: DC

## 2022-04-18 MED ORDER — NICOTINE 7 MG/24HR TD PT24: 7.0000 mg | MEDICATED_PATCH | Freq: Every day | TRANSDERMAL | 0 refills | Status: DC

## 2022-04-18 MED ORDER — NICOTINE 14 MG/24HR TD PT24: 14.0000 mg | MEDICATED_PATCH | Freq: Every day | TRANSDERMAL | 0 refills | Status: DC

## 2022-04-18 MED ORDER — SONAFINE EX EMUL
1.0000 "application " | Freq: Two times a day (BID) | CUTANEOUS | Status: DC
  Administered 2022-04-18: 1 via TOPICAL

## 2022-04-18 NOTE — Progress Notes (Signed)
Pt here for patient teaching.    Pt given Radiation and You booklet, Managing Acute Radiation Side Effects for Head and Neck Cancer handout, skin care instructions, and Sonafine.    Reviewed areas of pertinence such as fatigue, hair loss in treatment field, mouth changes, skin changes, throat changes, earaches, and taste changes .   Pt able to give teach back of to pat skin, use unscented/gentle soap, and drink plenty of water,apply Sonafine bid, avoid applying anything to skin within 4 hours of treatment, and to use an electric razor if they must shave.   Pt demonstrated understanding and verbalizes understanding of information given and will contact nursing with any questions or concerns.    Http://rtanswers.org/treatmentinformation/whattoexpect/index

## 2022-04-19 ENCOUNTER — Other Ambulatory Visit: Payer: Self-pay

## 2022-04-19 ENCOUNTER — Ambulatory Visit
Admission: RE | Admit: 2022-04-19 | Discharge: 2022-04-19 | Disposition: A | Discharge status: Home / Self Care | Payer: Medicare Other | Source: Ambulatory Visit | Attending: Radiation Oncology | Admitting: Radiation Oncology

## 2022-04-19 DIAGNOSIS — Z510 Encounter for antineoplastic radiation therapy: Secondary | ICD-10-CM | POA: Diagnosis not present

## 2022-04-19 LAB — RAD ONC ARIA SESSION SUMMARY
Course Elapsed Days: 7
Plan Fractions Treated to Date: 5
Plan Prescribed Dose Per Fraction: 2 Gy
Plan Total Fractions Prescribed: 30
Plan Total Prescribed Dose: 60 Gy
Reference Point Dosage Given to Date: 10 Gy
Reference Point Session Dosage Given: 2 Gy
Session Number: 5

## 2022-04-20 ENCOUNTER — Other Ambulatory Visit: Payer: Self-pay

## 2022-04-20 ENCOUNTER — Encounter: Payer: Self-pay | Admitting: Licensed Clinical Social Worker

## 2022-04-20 ENCOUNTER — Ambulatory Visit: Payer: Medicare Other | Attending: Radiation Oncology

## 2022-04-20 ENCOUNTER — Ambulatory Visit
Admission: RE | Admit: 2022-04-20 | Discharge: 2022-04-20 | Disposition: A | Discharge status: Home / Self Care | Payer: Medicare Other | Source: Ambulatory Visit | Attending: Radiation Oncology | Admitting: Radiation Oncology

## 2022-04-20 DIAGNOSIS — Z79899 Other long term (current) drug therapy: Secondary | ICD-10-CM | POA: Insufficient documentation

## 2022-04-20 DIAGNOSIS — R293 Abnormal posture: Secondary | ICD-10-CM | POA: Diagnosis present

## 2022-04-20 DIAGNOSIS — F17210 Nicotine dependence, cigarettes, uncomplicated: Secondary | ICD-10-CM | POA: Diagnosis not present

## 2022-04-20 DIAGNOSIS — C770 Secondary and unspecified malignant neoplasm of lymph nodes of head, face and neck: Secondary | ICD-10-CM | POA: Insufficient documentation

## 2022-04-20 DIAGNOSIS — Z510 Encounter for antineoplastic radiation therapy: Secondary | ICD-10-CM | POA: Diagnosis present

## 2022-04-20 DIAGNOSIS — R1310 Dysphagia, unspecified: Secondary | ICD-10-CM | POA: Insufficient documentation

## 2022-04-20 DIAGNOSIS — Z7982 Long term (current) use of aspirin: Secondary | ICD-10-CM | POA: Insufficient documentation

## 2022-04-20 DIAGNOSIS — C801 Malignant (primary) neoplasm, unspecified: Secondary | ICD-10-CM | POA: Diagnosis present

## 2022-04-20 LAB — RAD ONC ARIA SESSION SUMMARY
Course Elapsed Days: 8
Plan Fractions Treated to Date: 6
Plan Prescribed Dose Per Fraction: 2 Gy
Plan Total Fractions Prescribed: 30
Plan Total Prescribed Dose: 60 Gy
Reference Point Dosage Given to Date: 12 Gy
Reference Point Session Dosage Given: 2 Gy
Session Number: 6

## 2022-04-20 NOTE — Progress Notes (Signed)
Oncology Nurse Navigator Documentation   I met with Mr. Wagenaar before Head and Neck MDC today. He is tolerating radiation well at this time. He knows to call me if he has any concerns or questions as he proceeds through his treatment.   Jennifer Malmfelt RN, BSN, OCN Head & Neck Oncology Nurse Navigator [facility] at [facility] Phone # [phone]  Fax # [phone]  

## 2022-04-20 NOTE — Progress Notes (Signed)
[place] CSW Progress Note  Holiday representative met with patient during H&N clinic. Pt reports that he is still doing well and has no needs at this time. CSW provided information on current support programs.    Joash Tony E Meribeth Vitug, LCSW

## 2022-04-20 NOTE — Therapy (Signed)
OUTPATIENT SPEECH LANGUAGE PATHOLOGY ONCOLOGY EVALUATION   Patient Name: Brad [name] MRN: [id number] DOB:05-Oct-1951, 71 y.o., male Today's Date: 04/20/2022  PCP: Lujean Amel, MD REFERRING PROVIDER: Eppie Gibson, MD   End of Session - 04/20/22 1244     Visit Number 1    Number of Visits 4    Date for SLP Re-Evaluation 07/19/22    SLP Start Time 0911    SLP Stop Time  0949    SLP Time Calculation (min) 38 min    Activity Tolerance Patient tolerated treatment well             Past Medical History:  Diagnosis Date   Allergy    Bronchitis    chronic   Cancer ([place])    Constipation    COPD (chronic obstructive pulmonary disease) ([place])    Dyspnea    Hypercholesteremia    Hypertension    Past Surgical History:  Procedure Laterality Date   COLONOSCOPY     FRACTURE SURGERY     right wrist   LARYNGOSCOPY AND ESOPHAGOSCOPY N/A 03/03/2022   Procedure: DIRECT LARYNGOSCOPY AND ESOPHAGOSCOPY BIOPSIES AND FROZEN SECTIONS;  Surgeon: Izora Gala, MD;  Location: [place];  Service: ENT;  Laterality: N/A;   Lipo Suction     NASOPHARYNGOSCOPY N/A 03/03/2022   Procedure: NASOPHARYNGOSCOPY;  Surgeon: Izora Gala, MD;  Location: [place];  Service: ENT;  Laterality: N/A;   POLYPECTOMY     RADICAL NECK DISSECTION Left 03/03/2022   Procedure: NECK DISSECTION;  Surgeon: Izora Gala, MD;  Location: [place];  Service: ENT;  Laterality: Left;   Patient Active Problem List   Diagnosis Date Noted   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 ([place]) 03/03/2022   Secondary and unspecified malignant neoplasm of lymph nodes of head, face and neck ([place]) 01/30/2022   SOB (shortness of breath) 05/19/2012   Cough 05/19/2012   COPD (chronic obstructive pulmonary disease) ([place]) 05/19/2012   HTN (hypertension) 05/19/2012   COPD exacerbation ([place]) 05/19/2012    ONSET DATE: February 2023   REFERRING DIAG: 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 He presented to his PCP on 01/05/22 with left neck lymphadenopathy. CT neck completed same day revealed an enlarged and heterogeneous left leel 2 lymph node measuring 5.0 X 2.8 cm noted as highly suspicious for nodal metastatic disease. No definite primary neoplasm was otherwise identified within the oral cavity, pharynx or larynx, or any other enlarged or suspicious lymph nodes elsewhere within the neck.  He saw Dr. Constance Holster 01/06/22 and during this visit, the patient reported first noticing the left neck mass 2 days prior to presentation, and denied any symptoms related to it. Subsequently, Dr. Constance Holster performed on laryngoscopy on this same date which revealed no abnormal findings. FNA of left neck mass was also performed during this visit which revealed findings consistent with squamous cell carcinoma. PET completed 01/25/22 revealing fairly symmetric hypermetabolic activity in the lingual tonsil region; No clear primary mass lesion was identified. PET also showed an enlarged hypermetabolic left level 2 lymph node. No additional enlarged or hypermetabolic cervical lymph nodes were appreciated, nor any signs of distant metastatic disease.  Bilateral lingual tonsil SUV  uptake. Recommendation by Dr. Constance Holster inlcuded proceeding with directed biopsies and modified neck dissection under anesthesia.  Biopsies completed 03/03/22 with Dr. Constance Holster revealing Left neck-inferior jugular vein dissection: Metastatic squamous cell carcinoma involving 3/23 lymph nodes, with 2 lymph nodes from level II and 1 lymph node from level III involved by carcinoma (p16 positive; EBV negative). No evidence of extranodal extension; jugular vein  negative for carcinoma; salivary gland negative for carcinoma. -- Biopsies of the vallecula, left tongue base, left tonsil, and nasopharynx negative for carcinoma. 03/31/22 Reconsult with Dr. Isidore Moos. It was decided that he will receive radiation, no chemotherapy, with tx plan 30 fractions of radiation to his oropharynx and bilateral neck. He started on 04/12/22 and will complete on 05/25/22.  PAIN:  Are you having pain? No  FALLS: Has patient fallen in last 6 months?  No  LIVING ENVIRONMENT: Lives with: lives with their spouse Lives in: House/apartment  PLOF:  Level of assistance: Independent with ADLs Employment: Retired   PATIENT GOALS Maintain WNL swallow function  OBJECTIVE:   DIAGNOSTIC FINDINGS: See above in "pertinent history"  RECOMMENDATIONS FROM OBJECTIVE SWALLOW STUDY (MBSS/FEES):  N/A  COGNITION: Overall cognitive status: Within functional limits for tasks assessed   LANGUAGE: Receptive and Expressive language appeared WNL.  ORAL MOTOR ASSESSMENT:   WFL  MOTOR SPEECH: WNL  CLINICAL SWALLOW ASSESSMENT:   Current diet: regular and thin liquids Dentition: adequate natural dentition Patient directly observed with POs: Yes: dysphagia 3 (soft) and thin liquids  Feeding: able to feed self Liquids provided by: cup Oral phase signs and symptoms:  None Pharyngeal phase signs and symptoms:  None  TODAY'S TREATMENT:  Research states the risk for dysphagia increases due to radiation and/or chemotherapy treatment due to a variety of factors, so SLP educated the pt about the possibility of reduced/limited ability for PO intake during rad tx. SLP also educated pt regarding possible changes to swallowing musculature after rad tx, and why adherence to dysphagia HEP provided today and PO consumption was necessary to inhibit muscle fibrosis following rad tx and to mitigate muscle disuse atrophy. SLP informed pt why this would be detrimental to their swallowing status and to their  pulmonary health. Pt demonstrated understanding of these things to SLP. SLP encouraged pt to safely eat and drink as deep into their radiation/chemotherapy as possible to provide the best possible long-term swallowing outcome for pt.    SLP then developed an individualized HEP for pt involving oral and pharyngeal strengthening and ROM and pt was instructed how to perform these exercises, including SLP demonstration. After SLP demonstration, pt return demonstrated each exercise. SLP ensured pt performance was correct prior to educating pt on next exercise. Pt required usual min-mod cues faded to modified independent to perform HEP. Pt was instructed to complete this program 6-7 days/week, at least 2 times a day until 6 months after his last day of rad tx, and then x2 a week after that, indefinitely. Among other modifications for days when pt cannot functionally swallow, SLP also suggested pt to perform only non-swallowing tasks on the handout/HEP, and if necessary to cycle through the swallowing portion so the full program of exercises can be completed instead of fatiguing on one of the swallowing exercises and being unable to perform the other swallowing exercises. SLP instructed that swallowing exercises should then be added back into the regimen as pt is able to do so.   PATIENT EDUCATION: Education details: late effects head/neck radiation on swallow function, HEP procedure, and modification  to HEP when difficulty experienced with swallowing during and after radiation course Person educated: Patient Education method: Explanation, Demonstration, Verbal cues, and Handouts Education comprehension: verbalized understanding, returned demonstration, verbal cues required, and needs further education   ASSESSMENT:  CLINICAL IMPRESSION: Patient is a 71 y.o. male who was seen today for assessment of swallowing as they undergo radiation/chemoradiation therapy. Today pt ate items from Dys [facility] and drank thin  liquids. POs: Pt ate [place] sandwich and drank thin liquids without overt s/s oral or pharyngeal difficulty. At this time pt swallowing is deemed WNL/WFL with these POs. No oral or overt s/sx pharyngeal deficits, including aspiration were observed. There are no overt s/s aspiration PNA observed by SLP nor any reported by pt at this time. Data indicate that pt's swallow ability will likely decrease over the course of radiation/chemoradiation therapy and could very well decline over time following the conclusion of that therapy due to muscle disuse atrophy and/or muscle fibrosis. Pt will cont to need to be seen by SLP in order to assess safety of PO intake, assess the need for recommending any objective swallow assessment, and ensuring pt is correctly completing the individualized HEP.  OBJECTIVE IMPAIRMENTS include dysphagia. These impairments are limiting patient from safety when swallowing. Factors affecting potential to achieve goals and functional outcome are  none . Patient will benefit from skilled SLP services to address above impairments and improve overall function.  REHAB POTENTIAL: Excellent   GOALS: Goals reviewed with patient? No  SHORT TERM GOALS: Target completion:  first 3 therapy sessions (overall visit #4)      pt will complete HEP with rare min A  Baseline: Goal status: INITIAL  2.  pt will tell SLP why pt is completing HEP with modified independence Baseline:  Goal status: INITIAL  3.  pt will describe 3 overt s/s aspiration PNA with modified independence Baseline:  Goal status: INITIAL  4.  pt will tell SLP how a food journal could hasten return to a more normalized diet Baseline:  Goal status: INITIAL   LONG TERM GOALS: Target: sixth ST session (overall visit #7)  pt will complete HEP with modified independence over two visits Baseline:  Goal status: INITIAL  2.  pt will describe how to modify HEP over time, and the timeline associated with reduction in HEP  frequency with modified independence over two sessions Baseline:  Goal status: INITIAL   PLAN: SLP FREQUENCY:  once every approx 4 weeks  SLP DURATION:  7 sessions  PLANNED INTERVENTIONS: Aspiration precaution training, Pharyngeal strengthening exercises, Diet toleration management , Trials of upgraded texture/liquids, SLP instruction and feedback, Compensatory strategies, and Patient/family education    [facility], [place] 04/20/2022, 12:45 PM

## 2022-04-21 ENCOUNTER — Other Ambulatory Visit: Payer: Self-pay

## 2022-04-21 ENCOUNTER — Ambulatory Visit
Admission: RE | Admit: 2022-04-21 | Discharge: 2022-04-21 | Disposition: A | Discharge status: Home / Self Care | Payer: Medicare Other | Source: Ambulatory Visit | Attending: Radiation Oncology | Admitting: Radiation Oncology

## 2022-04-21 DIAGNOSIS — Z510 Encounter for antineoplastic radiation therapy: Secondary | ICD-10-CM | POA: Diagnosis not present

## 2022-04-21 LAB — RAD ONC ARIA SESSION SUMMARY
Course Elapsed Days: 9
Plan Fractions Treated to Date: 7
Plan Prescribed Dose Per Fraction: 2 Gy
Plan Total Fractions Prescribed: 30
Plan Total Prescribed Dose: 60 Gy
Reference Point Dosage Given to Date: 14 Gy
Reference Point Session Dosage Given: 2 Gy
Session Number: 7

## 2022-04-24 ENCOUNTER — Other Ambulatory Visit: Payer: Self-pay

## 2022-04-24 ENCOUNTER — Ambulatory Visit: Payer: Medicare Other

## 2022-04-24 ENCOUNTER — Other Ambulatory Visit: Payer: Self-pay | Admitting: Radiation Oncology

## 2022-04-24 ENCOUNTER — Ambulatory Visit
Admission: RE | Admit: 2022-04-24 | Discharge: 2022-04-24 | Disposition: A | Discharge status: Home / Self Care | Payer: Medicare Other | Source: Ambulatory Visit | Attending: Radiation Oncology | Admitting: Radiation Oncology

## 2022-04-24 DIAGNOSIS — C770 Secondary and unspecified malignant neoplasm of lymph nodes of head, face and neck: Secondary | ICD-10-CM

## 2022-04-24 DIAGNOSIS — Z510 Encounter for antineoplastic radiation therapy: Secondary | ICD-10-CM | POA: Diagnosis not present

## 2022-04-24 LAB — RAD ONC ARIA SESSION SUMMARY
Course Elapsed Days: 12
Plan Fractions Treated to Date: 8
Plan Prescribed Dose Per Fraction: 2 Gy
Plan Total Fractions Prescribed: 30
Plan Total Prescribed Dose: 60 Gy
Reference Point Dosage Given to Date: 16 Gy
Reference Point Session Dosage Given: 2 Gy
Session Number: 8

## 2022-04-24 MED ORDER — LIDOCAINE VISCOUS HCL 2 % MT SOLN: OROMUCOSAL | 3 refills | Status: DC

## 2022-04-25 ENCOUNTER — Other Ambulatory Visit: Payer: Self-pay

## 2022-04-25 ENCOUNTER — Ambulatory Visit
Admission: RE | Admit: 2022-04-25 | Discharge: 2022-04-25 | Disposition: A | Discharge status: Home / Self Care | Payer: Medicare Other | Source: Ambulatory Visit | Attending: Radiation Oncology | Admitting: Radiation Oncology

## 2022-04-25 DIAGNOSIS — Z510 Encounter for antineoplastic radiation therapy: Secondary | ICD-10-CM | POA: Diagnosis not present

## 2022-04-25 LAB — RAD ONC ARIA SESSION SUMMARY
Course Elapsed Days: 13
Plan Fractions Treated to Date: 9
Plan Prescribed Dose Per Fraction: 2 Gy
Plan Total Fractions Prescribed: 30
Plan Total Prescribed Dose: 60 Gy
Reference Point Dosage Given to Date: 18 Gy
Reference Point Session Dosage Given: 2 Gy
Session Number: 9

## 2022-04-26 ENCOUNTER — Other Ambulatory Visit: Payer: Self-pay

## 2022-04-26 ENCOUNTER — Ambulatory Visit
Admission: RE | Admit: 2022-04-26 | Discharge: 2022-04-26 | Disposition: A | Discharge status: Home / Self Care | Payer: Medicare Other | Source: Ambulatory Visit | Attending: Radiation Oncology | Admitting: Radiation Oncology

## 2022-04-26 DIAGNOSIS — Z510 Encounter for antineoplastic radiation therapy: Secondary | ICD-10-CM | POA: Diagnosis not present

## 2022-04-26 LAB — RAD ONC ARIA SESSION SUMMARY
Course Elapsed Days: 14
Plan Fractions Treated to Date: 10
Plan Prescribed Dose Per Fraction: 2 Gy
Plan Total Fractions Prescribed: 30
Plan Total Prescribed Dose: 60 Gy
Reference Point Dosage Given to Date: 20 Gy
Reference Point Session Dosage Given: 2 Gy
Session Number: 10

## 2022-04-27 ENCOUNTER — Other Ambulatory Visit: Payer: Self-pay

## 2022-04-27 ENCOUNTER — Ambulatory Visit
Admission: RE | Admit: 2022-04-27 | Discharge: 2022-04-27 | Disposition: A | Discharge status: Home / Self Care | Payer: Medicare Other | Source: Ambulatory Visit | Attending: Radiation Oncology | Admitting: Radiation Oncology

## 2022-04-27 ENCOUNTER — Inpatient Hospital Stay: Payer: Medicare Other | Attending: Hematology and Oncology | Admitting: Dietician

## 2022-04-27 DIAGNOSIS — Z510 Encounter for antineoplastic radiation therapy: Secondary | ICD-10-CM | POA: Diagnosis not present

## 2022-04-27 LAB — RAD ONC ARIA SESSION SUMMARY
Course Elapsed Days: 15
Plan Fractions Treated to Date: 11
Plan Prescribed Dose Per Fraction: 2 Gy
Plan Total Fractions Prescribed: 30
Plan Total Prescribed Dose: 60 Gy
Reference Point Dosage Given to Date: 22 Gy
Reference Point Session Dosage Given: 2 Gy
Session Number: 11

## 2022-04-27 NOTE — Progress Notes (Signed)
Nutrition Assessment   Reason for Assessment: New Head & Neck  ASSESSMENT: 71 year old male with SCC of left neck metastatic to lymph. He is s/p left JV neck dissection on 4/14. Patient is receiving 6 weeks of radiation therapy to oropharyngeal mucosa and neck Patient does not have feeding tube. He is under the care of Dr. Isidore Moos.   Past medical history includes COPD, dyspnea, constipation, HTN, HLD, tobacco abuse, alcohol use.  Met with patient in office following radiation. He reports diminished taste as well as painful swallow. Patient using lidocaine rinse. This is working well. Patient reports bread is becoming challenging to eat. He typically eats only when hungry. 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 He has Ensure Original at home, but has not tried these yet. Patient unsure of daily water intake, reports keeping a large cup filled through out the day. Patient is not resting well, reports waking in the middle of the night with dry mouth. He keeps water and ginger ale at bedside. He is doing baking soda salt water rinses 1-2 times/day. Patient goes to the gym ~4 times weekly, reports 45 minutes on bike and 20 minutes on machines.   Nutrition Focused Physical Exam: deferred    Medications: D3, norco, MVI, hyzaar, zofran-odt, crestor, vit E, hycet, lidocaine, wellbutrin, B12, nicoderm, viagra   Labs: 4/05 labs reviewed   Anthropometrics:   Height: 5'8" Weight: 212.6 lb (6/5) UBW: 210 lb  BMI: 32.3   NUTRITION DIAGNOSIS: Predicted suboptimal intake related to cancer and associated treatment side effects as evidenced by radiation therapy affecting ability to swallow   INTERVENTION:  Educated on importance of adequate intake of calories and protein to maintain weights/strength Educated on small frequent meals and snacks with foods easy to chew  and swallow - soft moist high protein ideas provided  Encouraged pt try Ensure, suggested switching to Ensure Plus/equivalent for added calories and drinking 2 daily - samples of Ensure Plus/Complete + one packet CIB for pt to try Educated on ways to add calories/protein to foods (using milk vs water in oatmeal/soups, adding cheese, high calorie shakes) shake recipes given Discussed strategies for diminished taste/metallic taste - handout with tips provided Continue baking soda salt water rinses several times daily and before meals Contact information given   MONITORING, EVALUATION, GOAL: Patient will tolerate increased calories and protein to minimize weight loss    Next Visit: Monday June 12 with Pamala Hurry

## 2022-04-28 ENCOUNTER — Ambulatory Visit
Admission: RE | Admit: 2022-04-28 | Discharge: 2022-04-28 | Disposition: A | Discharge status: Home / Self Care | Payer: Medicare Other | Source: Ambulatory Visit | Attending: Radiation Oncology | Admitting: Radiation Oncology

## 2022-04-28 ENCOUNTER — Other Ambulatory Visit: Payer: Self-pay

## 2022-04-28 DIAGNOSIS — Z510 Encounter for antineoplastic radiation therapy: Secondary | ICD-10-CM | POA: Diagnosis not present

## 2022-04-28 LAB — RAD ONC ARIA SESSION SUMMARY
Course Elapsed Days: 16
Plan Fractions Treated to Date: 12
Plan Prescribed Dose Per Fraction: 2 Gy
Plan Total Fractions Prescribed: 30
Plan Total Prescribed Dose: 60 Gy
Reference Point Dosage Given to Date: 24 Gy
Reference Point Session Dosage Given: 2 Gy
Session Number: 12

## 2022-05-01 ENCOUNTER — Ambulatory Visit
Admission: RE | Admit: 2022-05-01 | Discharge: 2022-05-01 | Disposition: A | Discharge status: Home / Self Care | Payer: Medicare Other | Source: Ambulatory Visit | Attending: Radiation Oncology | Admitting: Radiation Oncology

## 2022-05-01 ENCOUNTER — Other Ambulatory Visit: Payer: Self-pay

## 2022-05-01 ENCOUNTER — Inpatient Hospital Stay: Payer: Medicare Other | Admitting: Nutrition

## 2022-05-01 DIAGNOSIS — Z510 Encounter for antineoplastic radiation therapy: Secondary | ICD-10-CM | POA: Diagnosis not present

## 2022-05-01 LAB — RAD ONC ARIA SESSION SUMMARY
Course Elapsed Days: 19
Plan Fractions Treated to Date: 13
Plan Prescribed Dose Per Fraction: 2 Gy
Plan Total Fractions Prescribed: 30
Plan Total Prescribed Dose: 60 Gy
Reference Point Dosage Given to Date: 26 Gy
Reference Point Session Dosage Given: 2 Gy
Session Number: 13

## 2022-05-01 NOTE — Progress Notes (Signed)
Nutrition follow-up completed with patient after radiation therapy for SCC of left neck metastatic to lymph.  Patient is status post neck dissection and is receiving 6 weeks of radiation therapy to oropharyngeal mucosa and neck.  Patient does not have a feeding tube.  Weight decreased and documented as 206.8 pounds today down from 212.6 pounds June 5.  This is 5.8 pounds over 1 week.  Patient reports he is having taste alterations.  Food does not taste the same and some foods taste metallic.  Reports the top of his mouth is irritated and sore.  He is experiencing dry mouth especially overnight.  He is forcing himself to eat.  He has tried to drink 1 Ensure every day.  Nutrition diagnosis:  Predicted sub optimal energy intake has evolved into inadequate oral intake related to cancer and associated treatments as evidenced by 5.8 pound weight loss over 1 week.    Intervention: Reviewed strategies for improving taste alterations.  Encourage patient to use Biotene mouth rinse and baking soda and salt water rinses as needed, especially before meals. Reviewed strategies for improving dry mouth. Encouraged cool-mist humidifier in bedroom overnight. Increase water intake throughout the day. Encourage patient to try Thera breath mouth mints for dry mouth. Discouraged further weight loss.  Reviewed importance of weight maintenance. Recommended Ensure Plus or equivalent 3 times daily between meals. Patient will try a protein powder mixed with milk.  Monitoring, evaluation, goals: Patient will tolerate increased calories and protein for weight maintenance.  Next visit: Monday, June 19 after radiation therapy.  **Disclaimer: This note was dictated with voice recognition software. Similar sounding words can inadvertently be transcribed and this note may contain transcription errors which may not have been corrected upon publication of note.**

## 2022-05-02 ENCOUNTER — Ambulatory Visit: Payer: Medicare Other

## 2022-05-02 ENCOUNTER — Other Ambulatory Visit: Payer: Self-pay

## 2022-05-02 ENCOUNTER — Ambulatory Visit
Admission: RE | Admit: 2022-05-02 | Discharge: 2022-05-02 | Disposition: A | Discharge status: Home / Self Care | Payer: Medicare Other | Source: Ambulatory Visit | Attending: Radiation Oncology | Admitting: Radiation Oncology

## 2022-05-02 DIAGNOSIS — Z510 Encounter for antineoplastic radiation therapy: Secondary | ICD-10-CM | POA: Diagnosis not present

## 2022-05-02 LAB — RAD ONC ARIA SESSION SUMMARY
Course Elapsed Days: 20
Plan Fractions Treated to Date: 14
Plan Prescribed Dose Per Fraction: 2 Gy
Plan Total Fractions Prescribed: 30
Plan Total Prescribed Dose: 60 Gy
Reference Point Dosage Given to Date: 28 Gy
Reference Point Session Dosage Given: 2 Gy
Session Number: 14

## 2022-05-03 ENCOUNTER — Other Ambulatory Visit: Payer: Self-pay

## 2022-05-03 ENCOUNTER — Ambulatory Visit
Admission: RE | Admit: 2022-05-03 | Discharge: 2022-05-03 | Disposition: A | Discharge status: Home / Self Care | Payer: Medicare Other | Source: Ambulatory Visit | Attending: Radiation Oncology | Admitting: Radiation Oncology

## 2022-05-03 DIAGNOSIS — Z510 Encounter for antineoplastic radiation therapy: Secondary | ICD-10-CM | POA: Diagnosis not present

## 2022-05-03 LAB — RAD ONC ARIA SESSION SUMMARY
Course Elapsed Days: 21
Plan Fractions Treated to Date: 15
Plan Prescribed Dose Per Fraction: 2 Gy
Plan Total Fractions Prescribed: 30
Plan Total Prescribed Dose: 60 Gy
Reference Point Dosage Given to Date: 30 Gy
Reference Point Session Dosage Given: 2 Gy
Session Number: 15

## 2022-05-04 ENCOUNTER — Encounter: Payer: Self-pay | Admitting: Physical Therapy

## 2022-05-04 ENCOUNTER — Ambulatory Visit
Admission: RE | Admit: 2022-05-04 | Discharge: 2022-05-04 | Disposition: A | Discharge status: Home / Self Care | Payer: Medicare Other | Source: Ambulatory Visit | Attending: Radiation Oncology | Admitting: Radiation Oncology

## 2022-05-04 ENCOUNTER — Other Ambulatory Visit: Payer: Self-pay

## 2022-05-04 ENCOUNTER — Ambulatory Visit: Payer: Medicare Other | Admitting: Physical Therapy

## 2022-05-04 DIAGNOSIS — R1310 Dysphagia, unspecified: Secondary | ICD-10-CM | POA: Diagnosis not present

## 2022-05-04 DIAGNOSIS — Z510 Encounter for antineoplastic radiation therapy: Secondary | ICD-10-CM | POA: Diagnosis not present

## 2022-05-04 DIAGNOSIS — C770 Secondary and unspecified malignant neoplasm of lymph nodes of head, face and neck: Secondary | ICD-10-CM

## 2022-05-04 DIAGNOSIS — R293 Abnormal posture: Secondary | ICD-10-CM

## 2022-05-04 LAB — RAD ONC ARIA SESSION SUMMARY
Course Elapsed Days: 22
Plan Fractions Treated to Date: 16
Plan Prescribed Dose Per Fraction: 2 Gy
Plan Total Fractions Prescribed: 30
Plan Total Prescribed Dose: 60 Gy
Reference Point Dosage Given to Date: 32 Gy
Reference Point Session Dosage Given: 2 Gy
Session Number: 16

## 2022-05-04 NOTE — Therapy (Signed)
OUTPATIENT PHYSICAL THERAPY HEAD AND NECK BASELINE EVALUATION   Patient Name: Brad [name] MRN: [id number] DOB:14-Aug-1951, 71 y.o., male Today's Date: 05/04/2022   PT End of Session - 05/04/22 0930     Visit Number 1    Number of Visits 2    Date for PT Re-Evaluation 06/15/22    PT Start Time 0904    PT Stop Time 0930    PT Time Calculation (min) 26 min    Activity Tolerance Patient tolerated treatment well    Behavior During Therapy WFL for tasks assessed/performed             Past Medical History:  Diagnosis Date   Allergy    Bronchitis    chronic   Cancer ([place])    Constipation    COPD (chronic obstructive pulmonary disease) ([place])    Dyspnea    Hypercholesteremia    Hypertension    Past Surgical History:  Procedure Laterality Date   COLONOSCOPY     FRACTURE SURGERY     right wrist   LARYNGOSCOPY AND ESOPHAGOSCOPY N/A 03/03/2022   Procedure: DIRECT LARYNGOSCOPY AND ESOPHAGOSCOPY BIOPSIES AND FROZEN SECTIONS;  Surgeon: Izora Gala, MD;  Location: [facility];  Service: ENT;  Laterality: N/A;   Lipo Suction     NASOPHARYNGOSCOPY N/A 03/03/2022   Procedure: NASOPHARYNGOSCOPY;  Surgeon: Izora Gala, MD;  Location: [facility];  Service: ENT;  Laterality: N/A;   POLYPECTOMY     RADICAL NECK DISSECTION Left 03/03/2022   Procedure: NECK DISSECTION;  Surgeon: Izora Gala, MD;  Location: [facility];  Service: ENT;  Laterality: Left;   Patient Active Problem List   Diagnosis Date Noted   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 ([place]) 03/03/2022   Secondary and unspecified malignant neoplasm of lymph nodes of head, face and neck ([place]) 01/30/2022   SOB (shortness of breath) 05/19/2012   Cough 05/19/2012   COPD (chronic obstructive pulmonary disease) ([place]) 05/19/2012   HTN (hypertension) 05/19/2012   COPD exacerbation ([place]) 05/19/2012    PCP: Lujean Amel, MD  REFERRING PROVIDER: Eppie Gibson, MD  REFERRING DIAG: C77.0 (ICD-10-CM) - 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 He presented to his PCP on 01/05/22 with left neck lymphadenopathy. CT neck completed same day revealed an enlarged and heterogeneous left leel 2 lymph node measuring 5.0 X 2.8 cm noted as highly suspicious for nodal metastatic disease. No definite primary neoplasm was otherwise identified within the oral cavity, pharynx or larynx, or any other enlarged or suspicious lymph nodes elsewhere within the neck. 01/06/22 he saw Dr. Constance Holster for evaluation. During this visit, the patient reported first noticing the left neck mass 2 days prior to presentation, and denied any symptoms related to it. Subsequently, Dr. Constance Holster performed on laryngoscopy on this same date which revealed no abnormal findings. FNA of left neck mass was also performed during this visit which revealed findings consistent with squamous cell carcinoma. 01/25/22 PET completed revealing fairly symmetric hypermetabolic activity in the lingual tonsil region, No clear primary mass lesion was identified. PET also showed an enlarged  hypermetabolic left level 2 lymph node measuring 24 mm in the short axis, though >3cm in greatest dimension to my eye, with an SUV max of 15.7. No additional enlarged or hypermetabolic cervical lymph nodes were appreciated. No signs of distant metastatic disease.  Bilateral lingual tonsil SUV uptake. 03/03/22 Biopsies completed with Dr. Constance Holster revealing Left neck-inferior jugular vein dissection: Metastatic squamous cell carcinoma involving 3/23 lymph nodes, with 2 lymph nodes from level II and 1  lymph node from level III involved by carcinoma (p16 positive; EBV negative). No evidence of extranodal extension; jugular vein negative for carcinoma; salivary gland negative for carcinoma. -- Biopsies of the vallecula, left tongue base, left tonsil, and nasopharynx negative for carcinoma. He will receive 30 fractions of radiation to his Oropharynx and bilateral neck. He started on 04/12/22 and will complete on 05/25/22.  PATIENT GOALS   to be educated about the signs and symptoms of lymphedema and learn post op HEP.   PAIN:  Are you having pain? Yes: NPRS scale: 3/10 Pain location: throat Pain description: sore Aggravating factors: eating Relieving factors: baking soda/salt rinse; advil   PRECAUTIONS: Active CA  WEIGHT BEARING RESTRICTIONS No  FALLS:  Has patient fallen in last 6 months? No Does the patient have a fear of falling that limits activity? No Is the patient reluctant to leave the house due to a fear of falling?No  LIVING ENVIRONMENT: Patient lives with: his wife Lives in: House/apartment Has following equipment at home: None  OCCUPATION: retired  LEISURE: until a few weeks ago pt reports he went to the gym every other gym, 45 min on the stationary bike, 20-25 min on weight machines  PRIOR LEVEL OF FUNCTION: Independent   OBJECTIVE  COGNITION:           Overall cognitive status: Within functional limits for tasks assessed                  POSTURE:  Forward head and rounded shoulders posture   30 SEC SIT TO STAND: 14 reps in 30 sec without use of UEs which is  Averagefor patient's age   SHOULDER AROM:   WFL, though L shoulder is limited in certain directions secondary to nerve damage during surgery, pt reports this does not limit him in any way but he does have difficulty donning deodorant and putting in his contacts but he has compensated for this    CERVICAL AROM:   Percent limited  Flexion WFL  Extension 25% limited  Right lateral flexion 25% limited   Left lateral flexion 50% limited  Right rotation 25% limited  Left rotation 25% limited    (Blank rows=not tested)   GAIT:  Assessed: Yes Assistance needed: Independent  Ambulation Distance: 10 feet  Assistive Device: none Gait pattern: WFL Ambulation surface: Level  PATIENT EDUCATION:  Education details: Neck ROM, importance of posture when sitting, standing and lying down, deep breathing, walking program and importance of staying active throughout treatment, CURE article on staying active, "Why exercise?" flyer, lymphedema and PT info Person educated: Patient Education method: Explanation, Demonstration, Handout Education comprehension: Patient verbalized understanding and returned demonstration   HOME EXERCISE PROGRAM: Patient was instructed today in a home exercise program today for head and neck range of motion exercises. These included active cervical flexion, active cervical extension, active cervical rotation to each direction, upper trap stretch, and shoulder retraction. Patient was encouraged to do these 2-3 times a day, holding for 5 sec each and completing for 5 reps. Pt  was educated that once this becomes easier then hold the stretches for 30-60 seconds.    ASSESSMENT:  CLINICAL IMPRESSION: Pt arrives to PT with recently diagnosed metastatic cancer of cervical lymph nodes. Pt will undergo 30 fractions of radiation to his Oropharynx and bilateral neck. He started on 04/12/22 and will complete on 05/25/22.  Pt's cervical ROM was limited at baseline partly due to recent neck dissection. Instructed pt in basic scar mobilization to begin doing at home. Pt was going to the gym several times weekly but has not in the past two weeks. Encouraged pt to return exercise to help decrease fatigue. Educated pt about signs and symptoms of lymphedema as well as anatomy and physiology of lymphatic system. Educated pt in importance of staying as active as possible throughout treatment to  decrease fatigue as well as head and neck ROM exercises to decrease loss of ROM. Will see pt after completion of radiation to reassess ROM and assess for lymphedema and to determine therapy needs at that time.  Pt will benefit from skilled therapeutic intervention to improve on the following deficits: Decreased knowledge of precautions and postural dysfunction. Other deficits: decreased ROM  PT treatment/interventions: ADL/self-care home management, pt/family education, therapeutic exercise.   REHAB POTENTIAL: Good  CLINICAL DECISION MAKING: Stable/uncomplicated  EVALUATION COMPLEXITY: Low   GOALS: Goals reviewed with patient? YES  LONG TERM GOALS: (STG=LTG)   Name Target Date  Goal status  1 Patient will be able to verbalize understanding of a home exercise program for cervical range of motion, posture, and walking.   Baseline:  No knowledge 05/04/2022 Achieved at eval  2 Patient will be able to verbalize understanding of proper sitting and standing posture. Baseline:  No knowledge 05/04/2022 Achieved at eval  3 Patient will be able to verbalize understanding of lymphedema risk and availability of treatment for this condition Baseline:  No knowledge 05/04/2022 Achieved at eval  4 Pt will demonstrate a return to full cervical ROM and function post operatively compared to baselines and not demonstrate any signs or symptoms of lymphedema.  Baseline: See objective measurements taken today. 06/15/2022 New     PLAN: PT FREQUENCY/DURATION: EVAL and 1 follow up appointment.   PLAN FOR NEXT SESSION: will reassess 2 weeks after completion of radiation to determine needs.  Patient will follow up at outpatient cancer rehab 2 weeks after completion of radiation.  If the patient requires physical therapy at that time, a specific plan will be dictated and sent to the referring physician for approval. The patient was educated today on appropriate basic range of motion exercises to begin now and  continue throughout radiation and educated on the signs and symptoms of lymphedema. Patient verbalized good understanding.     Physical Therapy Information for During and After Head/Neck Cancer Treatment: Lymphedema is a swelling condition that you may be at risk for in your neck and/or face if you have radiation treatment to the area and/or if you have surgery that includes removing lymph nodes.  There is treatment available for this condition and it is not life-threatening.  Contact your physician or physical therapist with concerns. An excellent resource for those seeking information on lymphedema is the National Lymphedema Network's website.  It can be accessed at [place].org If you notice swelling in your neck or face at any time following surgery (even if it is many years from now), please contact your doctor or physical therapist to discuss this.  Lymphedema can be treated at any time but it  is easier for you if it is treated early on. If you have had surgery to your neck, please check with your surgeon about how soon to start doing neck range of motion exercises.  If you are not having surgery, I encourage you to start doing neck range of motion exercises today and continue these while undergoing treatment, UNLESS you have irritation of your skin or soft tissue that is aggravated by doing them.  These exercises are intended to help you prevent loss of range of motion and/or to gain range of motion in your neck (which can be limited by tightening effects of radiation), and NOT to aggravate these tissues if they develop sensitivities from treatment. Neck range of motion exercises should be done to the point of feeling a GENTLE, TOLERABLE stretch only.  You are encouraged to start a walking or other exercise program tomorrow and continue this as much as you are able through and after treatment.  Please feel free to call me with any questions. Manus Gunning, PT, CLT Physical Therapist  and Certified Lymphedema Therapist [facility] [address], [address], [place], [place] [address] [phone] [email or web address]  WALKING  Walking is a great form of exercise to increase your strength, endurance and overall fitness.  A walking program can help you start slowly and gradually build endurance as you go.  Everyone's ability is different, so each person's starting point will be different.  You do not have to follow them exactly.  The are just samples. You should simply find out what's right for you and stick to that program.   In the beginning, you'll start off walking 2-3 times a day for short distances.  As you get stronger, you'll be walking further at just 1-2 times per day.  A. You Can Walk For A Certain Length Of Time Each Day    Walk 5 minutes 3 times per day.  Increase 2 minutes every 2 days (3 times per day).  Work up to 25-30 minutes (1-2 times per day).   Example:   Day 1-2 5 minutes 3 times per day   Day 7-8 12 minutes 2-3 times per day   Day 13-14 25 minutes 1-2 times per day  B. You Can Walk For a Certain Distance Each Day     Distance can be substituted for time.    Example:   3 trips to mailbox (at road)   3 trips to corner of block   3 trips around the block  C. Go to local high school and use the track.    Walk for distance ____ around track  Or time ____ minutes  D. Walk ____ Jog ____ Run ___   Why exercise?  So many benefits! Here are SOME of them: Heart health, including raising your good cholesterol level and reducing heart rate and blood pressure Lung health, including improved lung capacity It burns fats, and most of [place] can stand to be leaner, whether or not we are overweight. It increases the body's natural painkillers and mood elevators, so makes you feel better. Not only makes you feel better, but look better too Improves sleep Takes a bite out of stress May decrease your risk of many types  of cancer If you are currently undergoing cancer treatment, exercise may improve your ability to tolerate treatments including chemotherapy. For everybody, it can improve your energy level. Those with cancer-related fatigue report a 40-50% reduction in this symptom when exercising regularly. If you are a  survivor of breast, colon, or prostate cancer, it may decrease your risk of a recurrence. (This may hold for other cancers too, but so far we have data just for these three types.)  How to exercise: Get your doctor's okay. Pick something you enjoy doing, like walking, Zumba, biking, swimming, or whatever. Start at low intensity and time, then gradually increase.  (See walking program handout.) Set a goal to achieve over time.  The American Cancer Society, American Heart Association, and U.S. Dept. of Health and Human Services recommend 150 minutes of moderate exercise, 75 minutes of vigorous exercise, or a combination of both per week. This should be done in episodes at least 10 minutes long, spread throughout the week.  Need help being motivated? Pick something you enjoy doing, because you'll be more inclined to stick with that activity than something that feels like a chore. Do it with a friend so that you are accountable to each other. Schedule it into your day. Place it on your calendar and keep that appointment just like you do any appointment that you make. Join an exercise group that meets at a specific time.  That way, you have to show up on time, and that makes it harder to procrastinate about doing your workout.  It also keeps you accountable--people begin to expect you to be there. Join a gym where you feel comfortable and not intimidated, at the right cost. Sign up for something that you'll need to be in shape for on a specific date, like a 1K or a 5K to walk or run, a 20 or 30 mile bike ride, a mud run or something like that. If the date is looming, you know you'll need to train to be  ready for it.  An added benefit is that many of these are fundraisers for good causes. If you've already paid for a gym membership, group exercise class or event, you might as well work out, so you haven't wasted your money!    [facility] [place], PT 05/04/2022, 9:34 AM

## 2022-05-04 NOTE — Addendum Note (Signed)
Addended by: Manus Gunning L on: 05/04/2022 11:43 AM   Modules accepted: Orders

## 2022-05-05 ENCOUNTER — Other Ambulatory Visit: Payer: Self-pay

## 2022-05-05 ENCOUNTER — Ambulatory Visit
Admission: RE | Admit: 2022-05-05 | Discharge: 2022-05-05 | Disposition: A | Discharge status: Home / Self Care | Payer: Medicare Other | Source: Ambulatory Visit | Attending: Radiation Oncology | Admitting: Radiation Oncology

## 2022-05-05 DIAGNOSIS — Z510 Encounter for antineoplastic radiation therapy: Secondary | ICD-10-CM | POA: Diagnosis not present

## 2022-05-05 LAB — RAD ONC ARIA SESSION SUMMARY
Course Elapsed Days: 23
Plan Fractions Treated to Date: 17
Plan Prescribed Dose Per Fraction: 2 Gy
Plan Total Fractions Prescribed: 30
Plan Total Prescribed Dose: 60 Gy
Reference Point Dosage Given to Date: 34 Gy
Reference Point Session Dosage Given: 2 Gy
Session Number: 17

## 2022-05-08 ENCOUNTER — Ambulatory Visit
Admission: RE | Admit: 2022-05-08 | Discharge: 2022-05-08 | Disposition: A | Discharge status: Home / Self Care | Payer: Medicare Other | Source: Ambulatory Visit | Attending: Radiation Oncology | Admitting: Radiation Oncology

## 2022-05-08 ENCOUNTER — Other Ambulatory Visit: Payer: Self-pay

## 2022-05-08 ENCOUNTER — Inpatient Hospital Stay: Payer: Medicare Other | Admitting: Nutrition

## 2022-05-08 DIAGNOSIS — Z510 Encounter for antineoplastic radiation therapy: Secondary | ICD-10-CM | POA: Diagnosis not present

## 2022-05-08 LAB — RAD ONC ARIA SESSION SUMMARY
Course Elapsed Days: 26
Plan Fractions Treated to Date: 18
Plan Prescribed Dose Per Fraction: 2 Gy
Plan Total Fractions Prescribed: 30
Plan Total Prescribed Dose: 60 Gy
Reference Point Dosage Given to Date: 36 Gy
Reference Point Session Dosage Given: 2 Gy
Session Number: 18

## 2022-05-08 NOTE — Progress Notes (Signed)
Nutrition follow-up completed with patient after radiation therapy for SCC of the left neck metastatic to lymph.  Patient does not have a feeding tube.  Weight decreased and documented as 201.4 pounds today down from 206.8 pounds last week.  This is a 3% weight loss over 1 week which is severe.  Patient reports the way food tastes is his biggest deterrent to being able to eat adequately.  He is using baking soda and salt water rinses.  He uses lidocaine to help with swallowing.  He is consuming foods like an egg omelette with extra cheese, soups, and milkshakes made with chocolate Ensure and ice cream.  Estimated nutrition needs: 2200-2400 cal, 110-125 g protein, 2.4 L fluid.  Nutrition diagnosis: Inadequate oral intake continues.  Intervention: Focused on ways to increase calories and protein in foods patient tolerates.  Reviewed plenty of high-calorie high-protein recipes and supplements to try.  Provided a recipe booklet. Patient to continue baking soda and salt water rinses before and throughout meals. Reviewed importance of staying open to having a feeding tube and provided additional information. Provided samples.  Monitoring, evaluation, goals: Patient will tolerate increased calories and protein to minimize further weight loss.  Next visit: Tuesday, June 27 with Vinnie Level.  **Disclaimer: This note was dictated with voice recognition software. Similar sounding words can inadvertently be transcribed and this note may contain transcription errors which may not have been corrected upon publication of note.**

## 2022-05-09 ENCOUNTER — Ambulatory Visit
Admission: RE | Admit: 2022-05-09 | Discharge: 2022-05-09 | Disposition: A | Discharge status: Home / Self Care | Payer: Medicare Other | Source: Ambulatory Visit | Attending: Radiation Oncology | Admitting: Radiation Oncology

## 2022-05-09 ENCOUNTER — Other Ambulatory Visit: Payer: Self-pay

## 2022-05-09 DIAGNOSIS — Z510 Encounter for antineoplastic radiation therapy: Secondary | ICD-10-CM | POA: Diagnosis not present

## 2022-05-09 LAB — RAD ONC ARIA SESSION SUMMARY
Course Elapsed Days: 27
Plan Fractions Treated to Date: 19
Plan Prescribed Dose Per Fraction: 2 Gy
Plan Total Fractions Prescribed: 30
Plan Total Prescribed Dose: 60 Gy
Reference Point Dosage Given to Date: 38 Gy
Reference Point Session Dosage Given: 2 Gy
Session Number: 19

## 2022-05-10 ENCOUNTER — Other Ambulatory Visit: Payer: Self-pay

## 2022-05-10 ENCOUNTER — Ambulatory Visit
Admission: RE | Admit: 2022-05-10 | Discharge: 2022-05-10 | Disposition: A | Discharge status: Home / Self Care | Payer: Medicare Other | Source: Ambulatory Visit | Attending: Radiation Oncology | Admitting: Radiation Oncology

## 2022-05-10 DIAGNOSIS — Z510 Encounter for antineoplastic radiation therapy: Secondary | ICD-10-CM | POA: Diagnosis not present

## 2022-05-10 LAB — RAD ONC ARIA SESSION SUMMARY
Course Elapsed Days: 28
Plan Fractions Treated to Date: 20
Plan Prescribed Dose Per Fraction: 2 Gy
Plan Total Fractions Prescribed: 30
Plan Total Prescribed Dose: 60 Gy
Reference Point Dosage Given to Date: 40 Gy
Reference Point Session Dosage Given: 2 Gy
Session Number: 20

## 2022-05-11 ENCOUNTER — Other Ambulatory Visit: Payer: Self-pay

## 2022-05-11 ENCOUNTER — Ambulatory Visit
Admission: RE | Admit: 2022-05-11 | Discharge: 2022-05-11 | Disposition: A | Discharge status: Home / Self Care | Payer: Medicare Other | Source: Ambulatory Visit | Attending: Radiation Oncology | Admitting: Radiation Oncology

## 2022-05-11 DIAGNOSIS — Z510 Encounter for antineoplastic radiation therapy: Secondary | ICD-10-CM | POA: Diagnosis not present

## 2022-05-11 LAB — RAD ONC ARIA SESSION SUMMARY
Course Elapsed Days: 29
Plan Fractions Treated to Date: 21
Plan Prescribed Dose Per Fraction: 2 Gy
Plan Total Fractions Prescribed: 30
Plan Total Prescribed Dose: 60 Gy
Reference Point Dosage Given to Date: 42 Gy
Reference Point Session Dosage Given: 2 Gy
Session Number: 21

## 2022-05-12 ENCOUNTER — Other Ambulatory Visit: Payer: Self-pay

## 2022-05-12 ENCOUNTER — Ambulatory Visit
Admission: RE | Admit: 2022-05-12 | Discharge: 2022-05-12 | Disposition: A | Discharge status: Home / Self Care | Payer: Medicare Other | Source: Ambulatory Visit | Attending: Radiation Oncology | Admitting: Radiation Oncology

## 2022-05-12 DIAGNOSIS — Z510 Encounter for antineoplastic radiation therapy: Secondary | ICD-10-CM | POA: Diagnosis not present

## 2022-05-12 LAB — RAD ONC ARIA SESSION SUMMARY
Course Elapsed Days: 30
Plan Fractions Treated to Date: 22
Plan Prescribed Dose Per Fraction: 2 Gy
Plan Total Fractions Prescribed: 30
Plan Total Prescribed Dose: 60 Gy
Reference Point Dosage Given to Date: 44 Gy
Reference Point Session Dosage Given: 2 Gy
Session Number: 22

## 2022-05-15 ENCOUNTER — Ambulatory Visit
Admission: RE | Admit: 2022-05-15 | Discharge: 2022-05-15 | Disposition: A | Discharge status: Home / Self Care | Payer: Medicare Other | Source: Ambulatory Visit | Attending: Radiation Oncology | Admitting: Radiation Oncology

## 2022-05-15 ENCOUNTER — Other Ambulatory Visit: Payer: Self-pay

## 2022-05-15 DIAGNOSIS — Z510 Encounter for antineoplastic radiation therapy: Secondary | ICD-10-CM | POA: Diagnosis not present

## 2022-05-15 LAB — RAD ONC ARIA SESSION SUMMARY
Course Elapsed Days: 33
Plan Fractions Treated to Date: 23
Plan Prescribed Dose Per Fraction: 2 Gy
Plan Total Fractions Prescribed: 30
Plan Total Prescribed Dose: 60 Gy
Reference Point Dosage Given to Date: 46 Gy
Reference Point Session Dosage Given: 2 Gy
Session Number: 23

## 2022-05-16 ENCOUNTER — Ambulatory Visit
Admission: RE | Admit: 2022-05-16 | Discharge: 2022-05-16 | Disposition: A | Discharge status: Home / Self Care | Payer: Medicare Other | Source: Ambulatory Visit | Attending: Radiation Oncology | Admitting: Radiation Oncology

## 2022-05-16 ENCOUNTER — Other Ambulatory Visit: Payer: Self-pay

## 2022-05-16 ENCOUNTER — Inpatient Hospital Stay: Payer: Medicare Other | Admitting: Dietician

## 2022-05-16 DIAGNOSIS — Z510 Encounter for antineoplastic radiation therapy: Secondary | ICD-10-CM | POA: Diagnosis not present

## 2022-05-16 LAB — RAD ONC ARIA SESSION SUMMARY
Course Elapsed Days: 34
Plan Fractions Treated to Date: 24
Plan Prescribed Dose Per Fraction: 2 Gy
Plan Total Fractions Prescribed: 30
Plan Total Prescribed Dose: 60 Gy
Reference Point Dosage Given to Date: 48 Gy
Reference Point Session Dosage Given: 2 Gy
Session Number: 24

## 2022-05-16 NOTE — Progress Notes (Signed)
Nutrition Follow-up:  Patient with SCC of left neck metastatic to lymph. He is receiving radiation therapy. Patient does not have feeding tube.  Met with patient after radiation. He reports all food taste the same - "thick and pasty." Patient reports the challenge is "getting things past his tongue." He is no longer able to eat eggs or soups. Patient has been drinking 3-4 Ensure and one milkshake daily. Ensure is challenging to consume at times. Patient using one Ensure in shakes. His mouth is constantly dry, patient endorses drinking plenty of water. Saliva has become thicker in the last week. Patient is using baking soda salt water rinses as well as ginger ale rinse. This is working well for him. Patient reports sore throat managed best with Advil. He uses lidocaine as needed.    Medications: reviewed  Labs: no new labs for review   Anthropometrics: Last weight 196.4 lb on 6/26 decreased 2.5% in the last 7 days, 5% in the past 2 weeks. This is severe.   6/19 - 201.4 lb  6/12- 206.8 lb   Estimated Energy Needs  Kcals: 2200-2400 Protein: 110-125 Fluid: 2.4 L  NUTRITION DIAGNOSIS: Inadequate oral intake continues    INTERVENTION:  Recommend 5 Ensure Plus/equivalent plus high calorie/high protein shake - shake recipes provided Suggested using fortified milk when making shakes at home - recipe given Offered CIB powder mixed with fortified milk as alternate supplement idea - sample packets provided     MONITORING, EVALUATION, GOAL: weight trends, intake   NEXT VISIT: Monday July 3 with Britta Mccreedy

## 2022-05-17 ENCOUNTER — Other Ambulatory Visit: Payer: Self-pay

## 2022-05-17 ENCOUNTER — Ambulatory Visit
Admission: RE | Admit: 2022-05-17 | Discharge: 2022-05-17 | Disposition: A | Discharge status: Home / Self Care | Payer: Medicare Other | Source: Ambulatory Visit | Attending: Radiation Oncology | Admitting: Radiation Oncology

## 2022-05-17 DIAGNOSIS — Z510 Encounter for antineoplastic radiation therapy: Secondary | ICD-10-CM | POA: Diagnosis not present

## 2022-05-17 LAB — RAD ONC ARIA SESSION SUMMARY
Course Elapsed Days: 35
Plan Fractions Treated to Date: 25
Plan Prescribed Dose Per Fraction: 2 Gy
Plan Total Fractions Prescribed: 30
Plan Total Prescribed Dose: 60 Gy
Reference Point Dosage Given to Date: 50 Gy
Reference Point Session Dosage Given: 2 Gy
Session Number: 25

## 2022-05-18 ENCOUNTER — Ambulatory Visit
Admission: RE | Admit: 2022-05-18 | Discharge: 2022-05-18 | Disposition: A | Discharge status: Home / Self Care | Payer: Medicare Other | Source: Ambulatory Visit | Attending: Radiation Oncology | Admitting: Radiation Oncology

## 2022-05-18 ENCOUNTER — Other Ambulatory Visit: Payer: Self-pay

## 2022-05-18 DIAGNOSIS — Z510 Encounter for antineoplastic radiation therapy: Secondary | ICD-10-CM | POA: Diagnosis not present

## 2022-05-18 LAB — RAD ONC ARIA SESSION SUMMARY
Course Elapsed Days: 36
Plan Fractions Treated to Date: 26
Plan Prescribed Dose Per Fraction: 2 Gy
Plan Total Fractions Prescribed: 30
Plan Total Prescribed Dose: 60 Gy
Reference Point Dosage Given to Date: 52 Gy
Reference Point Session Dosage Given: 2 Gy
Session Number: 26

## 2022-05-19 ENCOUNTER — Other Ambulatory Visit: Payer: Self-pay

## 2022-05-19 ENCOUNTER — Ambulatory Visit
Admission: RE | Admit: 2022-05-19 | Discharge: 2022-05-19 | Disposition: A | Discharge status: Home / Self Care | Payer: Medicare Other | Source: Ambulatory Visit | Attending: Radiation Oncology | Admitting: Radiation Oncology

## 2022-05-19 DIAGNOSIS — Z510 Encounter for antineoplastic radiation therapy: Secondary | ICD-10-CM | POA: Diagnosis not present

## 2022-05-19 LAB — RAD ONC ARIA SESSION SUMMARY
Course Elapsed Days: 37
Plan Fractions Treated to Date: 27
Plan Prescribed Dose Per Fraction: 2 Gy
Plan Total Fractions Prescribed: 30
Plan Total Prescribed Dose: 60 Gy
Reference Point Dosage Given to Date: 54 Gy
Reference Point Session Dosage Given: 2 Gy
Session Number: 27

## 2022-05-20 DIAGNOSIS — C801 Malignant (primary) neoplasm, unspecified: Secondary | ICD-10-CM

## 2022-05-20 HISTORY — DX: Malignant (primary) neoplasm, unspecified: C80.1

## 2022-05-22 ENCOUNTER — Ambulatory Visit
Admission: RE | Admit: 2022-05-22 | Discharge: 2022-05-22 | Disposition: A | Discharge status: Home / Self Care | Payer: Medicare Other | Source: Ambulatory Visit | Attending: Radiation Oncology | Admitting: Radiation Oncology

## 2022-05-22 ENCOUNTER — Inpatient Hospital Stay: Payer: Medicare Other | Attending: Hematology and Oncology | Admitting: Nutrition

## 2022-05-22 ENCOUNTER — Other Ambulatory Visit: Payer: Self-pay

## 2022-05-22 DIAGNOSIS — Z7982 Long term (current) use of aspirin: Secondary | ICD-10-CM | POA: Diagnosis not present

## 2022-05-22 DIAGNOSIS — F17210 Nicotine dependence, cigarettes, uncomplicated: Secondary | ICD-10-CM | POA: Insufficient documentation

## 2022-05-22 DIAGNOSIS — C770 Secondary and unspecified malignant neoplasm of lymph nodes of head, face and neck: Secondary | ICD-10-CM | POA: Insufficient documentation

## 2022-05-22 DIAGNOSIS — Z510 Encounter for antineoplastic radiation therapy: Secondary | ICD-10-CM | POA: Diagnosis not present

## 2022-05-22 DIAGNOSIS — C801 Malignant (primary) neoplasm, unspecified: Secondary | ICD-10-CM | POA: Insufficient documentation

## 2022-05-22 DIAGNOSIS — Z79899 Other long term (current) drug therapy: Secondary | ICD-10-CM | POA: Insufficient documentation

## 2022-05-22 LAB — RAD ONC ARIA SESSION SUMMARY
Course Elapsed Days: 40
Plan Fractions Treated to Date: 28
Plan Prescribed Dose Per Fraction: 2 Gy
Plan Total Fractions Prescribed: 30
Plan Total Prescribed Dose: 60 Gy
Reference Point Dosage Given to Date: 56 Gy
Reference Point Session Dosage Given: 2 Gy
Session Number: 28

## 2022-05-22 NOTE — Progress Notes (Signed)
Nutrition follow-up completed with patient after radiation therapy for SCC of the left neck metastatic to lymph. Patient does not have a feeding tube. Patient completes radiation therapy on Thursday, July 6.  Weight continues to decline and was documented as 192.2 pounds on July 3 down from 212.6 pounds which is a 10% weight loss and is severe for timeframe.  Patient reports his taste continues to limit his oral intake.  Patient is trying to drink for boost very high-calorie, Carnation breakfast and water.  He is unable to eat foods.  He has tried milkshakes however they taste horrible.  He is using baking soda and salt water rinses however this has not improved his taste.  Estimated nutrition needs: 2200-2400 cal, 110-125 g protein, 2.4 L fluid.  Nutrition diagnosis: Inadequate oral intake continues.  Intervention: Provided support and encouragement. Recommended patient drink 4 very high-calorie boosts which would provide 2120 cal and 88 g protein. I have offered additional suggestions for ways to add calories and protein. Patient has refused a feeding tube.  Monitoring, evaluation, goals: Patient will work to increase calories and protein to minimize further weight loss.  Next visit: Wednesday, July 26 after MD visit.  Follow-up is with Vinnie Level.  **Disclaimer: This note was dictated with voice recognition software. Similar sounding words can inadvertently be transcribed and this note may contain transcription errors which may not have been corrected upon publication of note.**

## 2022-05-24 ENCOUNTER — Ambulatory Visit
Admission: RE | Admit: 2022-05-24 | Discharge: 2022-05-24 | Disposition: A | Discharge status: Home / Self Care | Payer: Medicare Other | Source: Ambulatory Visit | Attending: Radiation Oncology | Admitting: Radiation Oncology

## 2022-05-24 ENCOUNTER — Other Ambulatory Visit: Payer: Self-pay

## 2022-05-24 DIAGNOSIS — Z510 Encounter for antineoplastic radiation therapy: Secondary | ICD-10-CM | POA: Diagnosis not present

## 2022-05-24 LAB — RAD ONC ARIA SESSION SUMMARY
Course Elapsed Days: 42
Plan Fractions Treated to Date: 29
Plan Prescribed Dose Per Fraction: 2 Gy
Plan Total Fractions Prescribed: 30
Plan Total Prescribed Dose: 60 Gy
Reference Point Dosage Given to Date: 58 Gy
Reference Point Session Dosage Given: 2 Gy
Session Number: 29

## 2022-05-25 ENCOUNTER — Ambulatory Visit
Admission: RE | Admit: 2022-05-25 | Discharge: 2022-05-25 | Disposition: A | Discharge status: Home / Self Care | Payer: Medicare Other | Source: Ambulatory Visit | Attending: Radiation Oncology | Admitting: Radiation Oncology

## 2022-05-25 ENCOUNTER — Other Ambulatory Visit: Payer: Self-pay

## 2022-05-25 ENCOUNTER — Encounter: Payer: Self-pay | Admitting: Radiation Oncology

## 2022-05-25 ENCOUNTER — Ambulatory Visit: Payer: Medicare Other | Attending: Radiation Oncology

## 2022-05-25 DIAGNOSIS — R1310 Dysphagia, unspecified: Secondary | ICD-10-CM | POA: Diagnosis not present

## 2022-05-25 DIAGNOSIS — Z510 Encounter for antineoplastic radiation therapy: Secondary | ICD-10-CM | POA: Diagnosis not present

## 2022-05-25 DIAGNOSIS — R293 Abnormal posture: Secondary | ICD-10-CM | POA: Insufficient documentation

## 2022-05-25 DIAGNOSIS — C770 Secondary and unspecified malignant neoplasm of lymph nodes of head, face and neck: Secondary | ICD-10-CM | POA: Insufficient documentation

## 2022-05-25 LAB — RAD ONC ARIA SESSION SUMMARY
Course Elapsed Days: 43
Plan Fractions Treated to Date: 30
Plan Prescribed Dose Per Fraction: 2 Gy
Plan Total Fractions Prescribed: 30
Plan Total Prescribed Dose: 60 Gy
Reference Point Dosage Given to Date: 60 Gy
Reference Point Session Dosage Given: 2 Gy
Session Number: 30

## 2022-05-25 NOTE — Therapy (Signed)
OUTPATIENT SPEECH LANGUAGE PATHOLOGY ONCOLOGY TREATMENT SESSION   Patient Name: Brad [name] MRN: [id number] DOB:1950-12-22, 71 y.o., male Today's Date: 05/25/2022  PCP: Lujean Amel, MD REFERRING PROVIDER: Eppie Gibson, MD   End of Session - 05/25/22 1058     Visit Number 2    Number of Visits 4    Date for SLP Re-Evaluation 07/19/22    SLP Start Time 86    SLP Stop Time  1054    SLP Time Calculation (min) 24 min    Activity Tolerance Patient tolerated treatment well              Past Medical History:  Diagnosis Date   Allergy    Bronchitis    chronic   Cancer ([place])    Constipation    COPD (chronic obstructive pulmonary disease) ([place])    Dyspnea    Hypercholesteremia    Hypertension    Past Surgical History:  Procedure Laterality Date   COLONOSCOPY     FRACTURE SURGERY     right wrist   LARYNGOSCOPY AND ESOPHAGOSCOPY N/A 03/03/2022   Procedure: DIRECT LARYNGOSCOPY AND ESOPHAGOSCOPY BIOPSIES AND FROZEN SECTIONS;  Surgeon: Izora Gala, MD;  Location: [place];  Service: ENT;  Laterality: N/A;   Lipo Suction     NASOPHARYNGOSCOPY N/A 03/03/2022   Procedure: NASOPHARYNGOSCOPY;  Surgeon: Izora Gala, MD;  Location: [place];  Service: ENT;  Laterality: N/A;   POLYPECTOMY     RADICAL NECK DISSECTION Left 03/03/2022   Procedure: NECK DISSECTION;  Surgeon: Izora Gala, MD;  Location: [place];  Service: ENT;  Laterality: Left;   Patient Active Problem List   Diagnosis Date Noted   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 ([place]) 03/03/2022   Secondary and unspecified malignant neoplasm of lymph nodes of head, face and neck ([place]) 01/30/2022   SOB (shortness of breath) 05/19/2012   Cough 05/19/2012   COPD (chronic obstructive pulmonary disease) ([place]) 05/19/2012   HTN (hypertension) 05/19/2012   COPD exacerbation ([place]) 05/19/2012    ONSET DATE: February 2023   REFERRING DIAG: 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 He presented to his PCP on 01/05/22 with left neck lymphadenopathy. CT neck completed same day revealed an enlarged and heterogeneous left leel 2 lymph node measuring 5.0 X 2.8 cm noted as highly suspicious for nodal metastatic disease. No definite primary neoplasm was otherwise identified within the oral cavity, pharynx or larynx, or any other enlarged or suspicious lymph nodes elsewhere within the neck.  He saw Dr. Constance Holster 01/06/22 and during this visit, the patient reported first noticing the left neck mass 2 days prior to presentation, and denied any symptoms related to it. Subsequently, Dr. Constance Holster performed on laryngoscopy on this same date which revealed no abnormal findings. FNA of left neck mass was also performed during this visit which revealed findings consistent with squamous cell carcinoma. PET completed 01/25/22 revealing fairly symmetric hypermetabolic activity in the lingual tonsil region; No clear primary mass lesion was identified. PET also showed an enlarged hypermetabolic left level 2 lymph node. No additional  enlarged or hypermetabolic cervical lymph nodes were appreciated, nor any signs of distant metastatic disease.  Bilateral lingual tonsil SUV uptake. Recommendation by Dr. Constance Holster inlcuded proceeding with directed biopsies and modified neck dissection under anesthesia.  Biopsies completed 03/03/22 with Dr. Constance Holster revealing Left neck-inferior jugular vein dissection: Metastatic squamous cell carcinoma involving 3/23 lymph nodes, with 2 lymph nodes from level II and 1 lymph node from  level III involved by carcinoma (p16 positive; EBV negative). No evidence of extranodal extension; jugular vein negative for carcinoma; salivary gland negative for carcinoma. -- Biopsies of the vallecula, left tongue base, left tonsil, and nasopharynx negative for carcinoma. 03/31/22 Reconsult with Dr. Isidore Moos. It was decided that he will receive radiation, no chemotherapy, with tx plan 30 fractions of radiation to his oropharynx and bilateral neck. He started on 04/12/22 and will complete on 05/25/22.  PAIN:  Are you having pain? Yes: NPRS scale: 5/10 Pain location: tongue Pain description: burning Aggravating factors: eating/drinking, swallowing  Relieving factors: medicine, saltwater rinse   PATIENT GOALS Maintain WNL swallow function  OBJECTIVE:   DIAGNOSTIC FINDINGS: See above in "pertinent history"  TODAY'S TREATMENT:  05/25/22: Drank water sips x8 without any overt s/sx oral or pharyngeal deficits. Did not report any overt s/sx aspiration with liquids. He politely refused solids due to taste and thickened saliva. Pt stated he has been performing HEP as prescribed. He completed HEP with modified indpendence. [name] told SLP that [place] was physically challenging for him so SLP taught him chin-fist (CTAR) with which he was independent after one rep. "I've been doing that one (chin-fist) anyway," he said. He was reminded about cycling through exercises instead of performing all reps at once, if necessary, and pt told SLP he has not yet needed to do this. He told SLP rationale for HEP without cues.   04/20/22: Research states the risk for dysphagia increases due to radiation and/or chemotherapy treatment due to a variety of factors, so SLP educated the pt about the possibility of reduced/limited ability for PO intake during rad tx. SLP also educated pt regarding possible changes to swallowing musculature after rad tx, and why adherence to dysphagia HEP provided today and PO consumption was necessary to  inhibit muscle fibrosis following rad tx and to mitigate muscle disuse atrophy. SLP informed pt why this would be detrimental to their swallowing status and to their pulmonary health. Pt demonstrated understanding of these things to SLP. SLP encouraged pt to safely eat and drink as deep into their radiation/chemotherapy as possible to provide the best possible long-term swallowing outcome for pt.   SLP then developed an individualized HEP for pt involving oral and pharyngeal strengthening and ROM and pt was instructed how to perform these exercises, including SLP demonstration. After SLP demonstration, pt return demonstrated each exercise. SLP ensured pt performance was correct prior to educating pt on next exercise. Pt required usual min-mod cues faded to modified independent to perform HEP. Pt was instructed to complete this program 6-7 days/week, at least 2 times a day until 6 months after his last day of rad tx, and then x2 a week after that, indefinitely. Among other modifications for days when pt cannot functionally swallow, SLP also suggested pt to perform only non-swallowing tasks on the handout/HEP, and if necessary to cycle through the swallowing portion so the full program of exercises can be completed instead of fatiguing on one of the swallowing exercises and being unable to perform the other swallowing exercises. SLP instructed that swallowing exercises should  then be added back into the regimen as pt is able to do so.   PATIENT EDUCATION: Education details: see above in "today's treatment" Person educated: Patient Education method: Explanation, demo Education comprehension: verbalized understanding, return demo'd   ASSESSMENT:  CLINICAL IMPRESSION: Patient is a 71 y.o. male who was seen today for assessment of swallowing as they undergo radiation therapy. Today pt drank thin liquids without overt s/s oral or pharyngeal difficulty. At this time pt swallowing is deemed WNL/WFL with these  POs. Pt politely refused solids today. There are no overt s/s aspiration PNA observed by SLP nor any reported by pt at this time. Data indicate that pt's swallow ability will likely decrease over the course of radiation/chemoradiation therapy and could very well decline over time following the conclusion of that therapy due to muscle disuse atrophy and/or muscle fibrosis. Pt will cont to need to be seen by SLP in order to assess safety of PO intake, assess the need for recommending any objective swallow assessment, and ensuring pt is correctly completing the individualized HEP.  OBJECTIVE IMPAIRMENTS include dysphagia. These impairments are limiting patient from safety when swallowing. Factors affecting potential to achieve goals and functional outcome are  none . Patient will benefit from skilled SLP services to address above impairments and improve overall function.  REHAB POTENTIAL: Excellent   GOALS: Goals reviewed with patient? No  SHORT TERM GOALS: Target completion:  first 3 therapy sessions (overall visit #4)      pt will complete HEP with rare min A  Baseline: Goal status: Met  2.  pt will tell SLP why pt is completing HEP with modified independence Baseline:  Goal status: Met  3.  pt will describe 3 overt s/s aspiration PNA with modified independence Baseline:  Goal status: Ongoing  4.  pt will tell SLP how a food journal could hasten return to a more normalized diet Baseline:  Goal status: Ongoing   LONG TERM GOALS: Target: sixth ST session (overall visit #7)  pt will complete HEP with modified independence over two visits Baseline:  Goal status: Ongoing  2.  pt will describe how to modify HEP over time, and the timeline associated with reduction in HEP frequency with modified independence over two sessions Baseline:  Goal status: Ongoing   PLAN: SLP FREQUENCY:  once every approx 4 weeks  SLP DURATION:  7 sessions  PLANNED INTERVENTIONS: Aspiration precaution  training, Pharyngeal strengthening exercises, Diet toleration management , Trials of upgraded texture/liquids, SLP instruction and feedback, Compensatory strategies, and Patient/family education    [facility], [place] 05/25/2022, 10:59 AM

## 2022-05-25 NOTE — Progress Notes (Signed)
Oncology Nurse Navigator Documentation   Met with Mr. Roggenkamp after final RT to offer support and to celebrate end of radiation treatment.   Provided verbal/written post-RT guidance: Importance of keeping all follow-up appts, especially those with Nutrition and SLP. Importance of protecting treatment area from sun. Continuation of Sonafine application 2-3 times daily, application of antibiotic ointment to areas of raw skin; when supply of Sonafine exhausted transition to OTC lotion with vitamin E. Provided/reviewed Epic calendar of upcoming appts. He met with Garald Balding of SLP today as well and will schedule a future appointment for continued evaluation.  Explained my role as navigator will continue for several more months, encouraged him to call me with needs/concerns.    Harlow Asa RN, BSN, OCN Head & Neck Oncology Nurse [place] at [facility] Phone # [phone]  Fax # [phone]

## 2022-05-26 ENCOUNTER — Encounter: Payer: Medicare Other | Admitting: Dietician

## 2022-06-01 NOTE — Progress Notes (Signed)
Oncology Nurse Navigator Documentation   Mr. Brad [name] called me yesterday reporting that he was having difficulty with oral intake after completing radiation to his head and neck on 7/6. He asked if it was possible to move up his scheduled follow up with Dr. Isidore Moos to next week. She had availability on 7/18 and his appointment was moved to that date. I have called him and made him aware and he is agreeable to the date/time.  Harlow Asa RN, BSN, OCN Head & Neck Oncology Nurse [place] at [facility] Phone # [phone]  Fax # [phone]

## 2022-06-05 NOTE — Progress Notes (Signed)
Brad [name] presents today for follow-up after completing radiation to his oropharynx and bilateral neck nodes on 05/25/2022  Pain issues, if any: Reports it has greatly improved. Has not had to utilize the viscous lidocaine in 3 days. Just takes PO ibuprofen in the mornings and before bed Using a feeding tube?: N/A Weight changes, if any:  Wt Readings from Last 3 Encounters:  06/06/22 182 lb 6.4 oz (82.7 kg)  05/08/22 201 lb 6.4 oz (91.4 kg)  05/01/22 206 lb 12.8 oz (93.8 kg)   Swallowing issues, if any: Yes--continues to only tolerate liquids Smoking or chewing tobacco? None Using fluoride trays daily? N/A--denies any dental concerns, but does reports tongue discomfort  Last ENT visit was on: Not since diagnosis Other notable issues, if any: Main concern is continued altered sense of taste, and thick film build  up when he drinks anything but water.  Denies any ear or jaw discomfort, or trouble with range of motion to his neck. Continues to deal with fatigue and lower stamina. Skin in treatment field remains hyperpigmented, but is intact

## 2022-06-06 ENCOUNTER — Ambulatory Visit
Admission: RE | Admit: 2022-06-06 | Discharge: 2022-06-06 | Disposition: A | Discharge status: Home / Self Care | Payer: Medicare Other | Source: Ambulatory Visit | Attending: Radiation Oncology | Admitting: Radiation Oncology

## 2022-06-06 ENCOUNTER — Other Ambulatory Visit: Payer: Self-pay

## 2022-06-06 VITALS — BP 103/61 | HR 85 | Temp 97.3°F | Resp 20 | Wt 182.4 lb

## 2022-06-06 DIAGNOSIS — C770 Secondary and unspecified malignant neoplasm of lymph nodes of head, face and neck: Secondary | ICD-10-CM | POA: Diagnosis present

## 2022-06-06 DIAGNOSIS — R634 Abnormal weight loss: Secondary | ICD-10-CM | POA: Insufficient documentation

## 2022-06-06 DIAGNOSIS — Z79899 Other long term (current) drug therapy: Secondary | ICD-10-CM | POA: Diagnosis not present

## 2022-06-06 DIAGNOSIS — C760 Malignant neoplasm of head, face and neck: Secondary | ICD-10-CM

## 2022-06-06 DIAGNOSIS — C801 Malignant (primary) neoplasm, unspecified: Secondary | ICD-10-CM | POA: Diagnosis not present

## 2022-06-06 DIAGNOSIS — Z923 Personal history of irradiation: Secondary | ICD-10-CM | POA: Insufficient documentation

## 2022-06-06 NOTE — Progress Notes (Signed)
Oncology Nurse Navigator Documentation   I met with Brad [name] during his post treatment follow up with Dr. Isidore Moos. He is feeling poorly and not able to eat normal texture foods at this time. He is drinking water and ensure to maintain hydration/nutrition. He was assured by Dr. Isidore Moos that how he is feeling is normal after completing radiation for head and neck cancer. He has been scheduled to see Dr. Isidore Moos in October to receive results of a post treatment PET scan. He knows to call me with any needs.  Harlow Asa RN, BSN, OCN Head & Neck Oncology Nurse [place] at [facility] Phone # [phone]  Fax # [phone]

## 2022-06-07 ENCOUNTER — Other Ambulatory Visit: Payer: Self-pay

## 2022-06-07 ENCOUNTER — Encounter: Payer: Self-pay | Admitting: Radiation Oncology

## 2022-06-07 DIAGNOSIS — C770 Secondary and unspecified malignant neoplasm of lymph nodes of head, face and neck: Secondary | ICD-10-CM

## 2022-06-07 NOTE — Progress Notes (Signed)
Radiation Oncology         [phone] ________________________________  Name: Brad [name] MRN: [id number]  Date: 06/06/2022  DOB: December 14, 1950  Follow-Up Visit Note  CC: Lujean Amel, MD  Lujean Amel, MD  Diagnosis and Prior Radiotherapy:       ICD-10-CM   1. Squamous cell carcinoma of head and neck (HCC)  C76.0     2. 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  He is still recovering.  He still has taste changes and a lot of phlegm.  This is very bothersome.  He denies any smoking.       His pain has improved significantly.  He is losing weight as it is difficult to eat a lot of food with his thick secretions and taste changes.  No PEG tube.             ALLERGIES:  has No Known Allergies.  Meds: Current Outpatient Medications  Medication Sig Dispense Refill   albuterol (PROVENTIL HFA;VENTOLIN HFA) 108 (90 BASE) MCG/ACT inhaler Inhale 2 puffs into the lungs every 6 (six) hours as needed. shortness of breath     aspirin 81 MG chewable tablet Chew 81 mg by mouth in the morning.     Budeson-Glycopyrrol-Formoterol (BREZTRI AEROSPHERE) 160-9-4.8 MCG/ACT AERO Inhale 2 puffs into the lungs in the morning and at bedtime.     buPROPion (WELLBUTRIN SR) 150 MG 12 hr tablet Start one week before quit date. Take 1 tab daily x 3 days, then 1 tab BID thereafter. 60 tablet 2   Cholecalciferol (VITAMIN D3 PO) Take 1 tablet by mouth in the morning.     Cyanocobalamin (VITAMIN B-12 PO) Take 1 tablet by mouth in the morning.     HYDROcodone-acetaminophen (NORCO) 7.5-325 MG  tablet Take 1 tablet by mouth every 6 (six) hours as needed for moderate pain. 20 tablet 0   lidocaine (XYLOCAINE) 2 % solution Patient: Mix 1part 2% viscous lidocaine, 1part H20. Swish & swallow 43m of diluted mixture, 319m before meals and at bedtime, up to QID 200 mL 3   losartan-hydrochlorothiazide (HYZAAR) 50-12.5 MG tablet Take 1 tablet by mouth in the morning.     Multiple Vitamin (MULTIVITAMIN WITH MINERALS) TABS Take 1 tablet by mouth in the morning.     nicotine (NICODERM CQ - DOSED IN MG/24 HOURS) 14 mg/24hr patch Place 1 patch (14 mg total) onto the skin daily. Apply 21 mg patch daily x 6 wk, then '14mg'$  patch daily x 2 wk, then 7 mg patch daily x 2 wk 14 patch 0   nicotine (NICODERM CQ - DOSED IN MG/24 HOURS) 21 mg/24hr patch Place 1 patch (21 mg total) onto the skin daily. Apply 21 mg patch daily x 6 wk, then '14mg'$  patch daily x 2 wk, then 7 mg patch daily x 2 wk 14 patch 2   nicotine (NICODERM CQ - DOSED IN MG/24 HR) 7 mg/24hr patch Place 1 patch (7 mg total) onto the skin daily. Apply 21 mg patch daily x 6 wk, then '14mg'$  patch daily x 2 wk,  then 7 mg patch daily x 2 wk 14 patch 0   ondansetron (ZOFRAN-ODT) 8 MG disintegrating tablet Take 1 tablet (8 mg total) by mouth every 8 (eight) hours as needed for nausea or vomiting. 20 tablet 1   rosuvastatin (CRESTOR) 20 MG tablet Take 20 mg by mouth in the morning.     sildenafil (VIAGRA) 100 MG tablet Take 100 mg by mouth daily as needed for erectile dysfunction.     tamsulosin (FLOMAX) 0.4 MG CAPS capsule Take 0.4 mg by mouth in the morning.     Testosterone 20.25 MG/ACT (1.62%) GEL Apply 1 Pump topically in the morning.     vitamin E 400 UNIT capsule Take 400 Units by mouth in the morning.     No current facility-administered medications for this encounter.    Physical Findings: The patient is in no acute distress. Patient is alert and oriented. Wt Readings from Last 3 Encounters:  06/06/22 182 lb 6.4 oz (82.7 kg)  05/08/22 201 lb 6.4  oz (91.4 kg)  05/01/22 206 lb 12.8 oz (93.8 kg)    weight is 182 lb 6.4 oz (82.7 kg). His temperature is 97.3 F (36.3 C) (abnormal). His blood pressure is 103/61 and his pulse is 85. His respiration is 20 and oxygen saturation is 97%. .  General: Alert and oriented, in no acute distress HEENT: Head is normocephalic. Extraocular movements are intact. Oropharynx is notable for healing mucosa without any sign of tumor Neck: Neck is notable for no palpable masses Skin: Skin in treatment fields shows satisfactory healing thus far Lymphatics: see Neck Exam Psychiatric: Judgment and insight are intact. Affect is appropriate.   Lab Findings: Lab Results  Component Value Date   WBC 5.0 02/22/2022   HGB 16.4 02/22/2022   HCT 50.5 02/22/2022   MCV 97.9 02/22/2022   PLT 156 02/22/2022    Lab Results  Component Value Date   TSH 1.955 03/31/2022    Radiographic Findings: No results found.  Impression/Plan:    1) Head and Neck Cancer Status: Healing from radiation therapy  2) Nutritional Status: He is still losing some weight.  We talked about methods to boost his calories and protein.  He is staying hydrated. PEG tube: None  3) Risk Factors: The patient has been educated about risk factors including alcohol and tobacco abuse; they understand that avoidance of alcohol and tobacco is important to prevent recurrences as well as other cancers  4) Swallowing: Continue exercises to prevent dysphagia  5) Dental: Encouraged to continue regular followup with dentistry, and dental hygiene including fluoride rinses.   6) Thyroid function:  Lab Results  Component Value Date   TSH 1.955 03/31/2022    7) Other: Emotional support and encouragement given today.  Patient was reassured that his lingering symptoms are typical given that he is not that far out from completing treatment and I do expect he will continue to heal and improve with time  8) Follow-up in October with CT of chest and neck  with contrast for restaging. The patient was encouraged to call with any issues or questions before then.  On date of service, in total, I spent 30 minutes on this encounter. Patient was seen in person.  This note was signed the day after his encounter.  Time noted is in reference to the date of service only. _____________________________________   Eppie Gibson, MD

## 2022-06-08 ENCOUNTER — Ambulatory Visit: Payer: Medicare Other | Admitting: Physical Therapy

## 2022-06-08 ENCOUNTER — Encounter: Payer: Self-pay | Admitting: Physical Therapy

## 2022-06-08 DIAGNOSIS — R293 Abnormal posture: Secondary | ICD-10-CM

## 2022-06-08 DIAGNOSIS — R1310 Dysphagia, unspecified: Secondary | ICD-10-CM | POA: Diagnosis not present

## 2022-06-08 DIAGNOSIS — C770 Secondary and unspecified malignant neoplasm of lymph nodes of head, face and neck: Secondary | ICD-10-CM

## 2022-06-08 NOTE — Therapy (Signed)
[facility] OUTPATIENT PHYSICAL THERAPY HEAD AND NECK POST RADIATION FOLLOW UP   Patient Name: Brad [name] MRN: [id number] DOB:[birth year], 71 y.o., male Today's Date: 06/08/2022   PT End of Session - 06/08/22 1430     Visit Number 2    Number of Visits 2    Date for PT Re-Evaluation 06/15/22    PT Start Time 1406    PT Stop Time 1425    PT Time Calculation (min) 19 min    Activity Tolerance Patient tolerated treatment well    Behavior During Therapy WFL for tasks assessed/performed             Past Medical History:  Diagnosis Date   Allergy    Bronchitis    chronic   Cancer ([place])    Constipation    COPD (chronic obstructive pulmonary disease) ([place])    Dyspnea    Hypercholesteremia    Hypertension    Past Surgical History:  Procedure Laterality Date   COLONOSCOPY     FRACTURE SURGERY     right wrist   LARYNGOSCOPY AND ESOPHAGOSCOPY N/A 03/03/2022   Procedure: DIRECT LARYNGOSCOPY AND ESOPHAGOSCOPY BIOPSIES AND FROZEN SECTIONS;  Surgeon: Izora Gala, MD;  Location: [facility];  Service: ENT;  Laterality: N/A;   Lipo Suction     NASOPHARYNGOSCOPY N/A 03/03/2022   Procedure: NASOPHARYNGOSCOPY;  Surgeon: Izora Gala, MD;  Location: [facility];  Service: ENT;  Laterality: N/A;   POLYPECTOMY     RADICAL NECK DISSECTION Left 03/03/2022   Procedure: NECK DISSECTION;  Surgeon: Izora Gala, MD;  Location: [facility];  Service: ENT;  Laterality: Left;   Patient Active Problem List   Diagnosis Date Noted   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 ([place]) 03/03/2022   Secondary and unspecified malignant neoplasm of lymph nodes of head, face and neck ([place]) 01/30/2022   SOB (shortness of breath) 05/19/2012   Cough 05/19/2012   COPD (chronic obstructive pulmonary disease) ([place]) 05/19/2012   HTN (hypertension) 05/19/2012   COPD exacerbation ([place]) 05/19/2012    PCP: Lujean Amel, MD  REFERRING PROVIDER: Eppie Gibson, MD  REFERRING DIAG:  C77.0 (ICD-10-CM) - Metastatic cancer to cervical lymph  nodes ([place])    THERAPY DIAG:  Abnormal posture  Metastasis to cervical lymph node ([place])  Rationale for Evaluation and Treatment Rehabilitation  ONSET DATE: 01/05/22  SUBJECTIVE:                                                                                                                                                                                           SUBJECTIVE STATEMENT: I am still having a hard time eating but  I was able to eat a cracker this afternoon. I am not having any swelling. Neck ROM feels normal.   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 He presented to his PCP on 01/05/22 with left neck lymphadenopathy. CT neck completed same day revealed an enlarged and heterogeneous left leel 2 lymph node measuring 5.0 X 2.8 cm noted as highly suspicious for nodal metastatic disease. No definite primary neoplasm was otherwise identified within the oral cavity, pharynx or larynx, or any other enlarged or suspicious lymph nodes elsewhere within the neck. 01/06/22 he saw Dr. Constance Holster for evaluation. During this visit, the patient reported first noticing the left neck mass 2 days prior to presentation, and denied any symptoms related to it. Subsequently, Dr. Constance Holster performed on laryngoscopy on this same date which revealed no abnormal findings. FNA of left neck mass was also performed during this visit which revealed findings consistent with squamous cell carcinoma. 01/25/22 PET completed revealing fairly symmetric hypermetabolic activity in the lingual tonsil region, No clear primary mass lesion was identified. PET also showed an enlarged hypermetabolic left level 2 lymph node measuring 24 mm in the short axis, though >3cm in greatest dimension to my eye, with an SUV max of 15.7. No additional enlarged or hypermetabolic cervical lymph nodes were appreciated. No signs of distant metastatic disease.  Bilateral lingual tonsil SUV uptake.  03/03/22 Biopsies completed with Dr. Constance Holster revealing Left neck-inferior jugular vein dissection: Metastatic squamous cell carcinoma involving 3/23 lymph nodes, with 2 lymph nodes from level II and 1 lymph node from level III involved by carcinoma (p16 positive; EBV negative). No evidence of extranodal extension; jugular vein negative for carcinoma; salivary gland negative for carcinoma. -- Biopsies of the vallecula, left tongue base, left tonsil, and nasopharynx negative for carcinoma. He will receive 30 fractions of radiation to his Oropharynx and bilateral neck. He started on 04/12/22 and will complete on 05/25/22.  PATIENT GOALS:  Reassess how my recovery is going related to neck ROM, cervical pain, fatigue, and swelling.  PAIN:  Are you having pain? No  PRECAUTIONS: Recent radiation, Head and neck lymphedema risk,    OBJECTIVE:   POSTURE:  Forward head and rounded shoulders posture   30 SEC SIT TO STAND: 15 reps in 30 sec without use of UEs which is  Averagefor patient's age.    SHOULDER AROM:   WFL, though L shoulder is limited in certain directions secondary to nerve damage during surgery, pt reports this does not limit him in any way but he does have difficulty donning deodorant but has learned to compensate  CERVICAL AROM:     Percent limited - EVAL 06/08/22  Flexion [facility] WFL  Extension 25% limited WFL  Right lateral flexion 25% limited 25% limited  Left lateral flexion 50% limited 25% limited  Right rotation 25% limited 25% limited  Left rotation 25% limited 25% limited                          (Blank rows=not tested)     LYMPHEDEMA ASSESSMENT:    Circumference in cm  4 cm superior to sternal notch around neck 39.8  6 cm superior to sternal notch around neck 41  8 cm superior to sternal notch around neck 43  (Blank rows=not tested)   CURRENT/PAST TREATMENTS:  Surgery type/date: L neck- inferior jugular vein dissection 3/23 nodes involved on  03/03/22  Radiation:completed  05/25/22  OTHER SYMPTOMS:  Pain No Fibrosis No Pitting edema No Infections No Decreased scar mobility No  PATIENT EDUCATION:  Education details: walking program, head and neck exercises, scar mobilization, return to exercise Person educated: Patient Education method: Explanation and Handouts Education comprehension: verbalized understanding   HOME EXERCISE PROGRAM:  Reviewed previously given post op HEP. Walking program  ASSESSMENT:  CLINICAL IMPRESSION: Pt returns to PT after completing radiation for treatment of metastatic 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. His cervical ROM has returned to baseline and in some instances is better than baseline. He does not demonstrate any signs or symptoms of lymphedema. Educated pt about signs of lymphedema to look for in the future and to report those to his doctor. Encouraged pt to return to the gym and walking and to start slowly to help improve his endurance. Pt has no needs for skilled PT services and will be discharged at this time.   Pt will benefit from skilled therapeutic intervention to improve on the following deficits: decreased knowledge of condition and postural dysfunction  PT treatment/interventions: ADL/Self care home management, Therapeutic exercises, Patient/Family education, and Self Care   GOALS Name Target Date (Remove Blue Hyperlink) Goal status  1 Pt will demonstrate a return to baseline cervical ROM measurements and not demonstrate any signs or symptoms of lymphedema. Baseline: 06/08/22  MET  _0 GOALS: Goals reviewed with patient? Yes  LONG TERM GOALS:  (STG=LTG)     PLAN: PT FREQUENCY/DURATION: d/c this visit  PLAN FOR NEXT SESSION: d/c this visit   [facility]  [address], [address]   [place] [address]  [phone]   Scar massage You can begin gentle scar massage to you incision sites. Gently place one hand on the incision  and move the skin (without sliding on the skin) in various directions. Do this for a few minutes and then you can gently massage either coconut oil or vitamin E cream into the scars.  Home exercise Program Continue doing the exercises you were given until you feel like you can do them without feeling any tightness at the end. It is best to do them for several months after completion of radiation since the effects of radiation continue past completion.   Walking Program Studies show that 30 minutes of walking per day (fast enough to elevate your heart rate) can significantly reduce the risk of a cancer recurrence. If you can't walk due to other medical reasons, we encourage you to find another activity you could do (like a stationary bike or water exercise).  Posture After treatment for head and neck cancer, people frequently sit with rounded shoulders and forward head posture because the front of the neck has become tight and it feels better. If you sit like this, you can become very tight and have pain in sitting or standing with good posture. Try to be aware of your posture and sit and stand up tall to heal properly.  Follow up PT: Please let you doctor know as soon as possible if you develop any swelling in your face or neck in the future. Lymphedema (swelling) can occur months after completion of radiation. The sooner you can let the doctor know, the sooner they can refer you back to PT. It is much easier to treat the swelling early on.   Allyson Sabal [place], PT 06/08/2022, 2:31 PM

## 2022-06-14 ENCOUNTER — Inpatient Hospital Stay: Payer: Medicare Other | Admitting: Dietician

## 2022-06-14 ENCOUNTER — Ambulatory Visit: Payer: Self-pay | Admitting: Radiation Oncology

## 2022-06-14 NOTE — Progress Notes (Signed)
Patient did not show for nutrition appointment. 

## 2022-06-21 ENCOUNTER — Ambulatory Visit: Payer: Medicare Other | Attending: Radiation Oncology

## 2022-06-21 DIAGNOSIS — R1310 Dysphagia, unspecified: Secondary | ICD-10-CM | POA: Diagnosis present

## 2022-06-21 NOTE — Patient Instructions (Signed)
   Signs of Aspiration Pneumonia   Chest pain/tightness Fever (can be low grade) Cough  With foul-smelling phlegm (sputum) With sputum containing pus or blood With greenish sputum Fatigue  Shortness of breath  Wheezing   **IF YOU HAVE THESE SIGNS, CONTACT YOUR DOCTOR OR GO TO THE EMERGENCY DEPARTMENT OR URGENT CARE AS SOON AS POSSIBLE**     

## 2022-06-21 NOTE — Therapy (Signed)
OUTPATIENT SPEECH LANGUAGE PATHOLOGY ONCOLOGY TREATMENT SESSION   Patient Name: Brad [name] MRN: [id number] DOB:1951/09/04, 71 y.o., male Today's Date: 06/21/2022  PCP: Brad Amel, MD REFERRING PROVIDER: Eppie Gibson, MD   End of Session - 06/21/22 1054     Visit Number 3    Number of Visits 4    Date for SLP Re-Evaluation 07/19/22    SLP Start Time 95    SLP Stop Time  1100    SLP Time Calculation (min) 40 min    Activity Tolerance Patient tolerated treatment well              Past Medical History:  Diagnosis Date   Allergy    Bronchitis    chronic   Cancer ([place])    Constipation    COPD (chronic obstructive pulmonary disease) ([place])    Dyspnea    Hypercholesteremia    Hypertension    Past Surgical History:  Procedure Laterality Date   COLONOSCOPY     FRACTURE SURGERY     right wrist   LARYNGOSCOPY AND ESOPHAGOSCOPY N/A 03/03/2022   Procedure: DIRECT LARYNGOSCOPY AND ESOPHAGOSCOPY BIOPSIES AND FROZEN SECTIONS;  Surgeon: Brad Gala, MD;  Location: [facility];  Service: ENT;  Laterality: N/A;   Lipo Suction     NASOPHARYNGOSCOPY N/A 03/03/2022   Procedure: NASOPHARYNGOSCOPY;  Surgeon: Brad Gala, MD;  Location: [facility];  Service: ENT;  Laterality: N/A;   POLYPECTOMY     RADICAL NECK DISSECTION Left 03/03/2022   Procedure: NECK DISSECTION;  Surgeon: Brad Gala, MD;  Location: [facility];  Service: ENT;  Laterality: Left;   Patient Active Problem List   Diagnosis Date Noted   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 ([place]) 03/03/2022   Secondary and unspecified malignant neoplasm of lymph nodes of head, face and neck ([place]) 01/30/2022   SOB (shortness of breath) 05/19/2012   Cough 05/19/2012   COPD (chronic obstructive pulmonary disease) ([place]) 05/19/2012   HTN (hypertension) 05/19/2012   COPD exacerbation ([place]) 05/19/2012    ONSET DATE: February 2023   REFERRING DIAG: 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 He presented to his PCP on 01/05/22 with left neck lymphadenopathy. CT neck completed same day revealed an enlarged and heterogeneous left leel 2 lymph node measuring 5.0 X 2.8 cm noted as highly suspicious for nodal metastatic disease. No definite primary neoplasm was otherwise identified within the oral cavity, pharynx or larynx, or any other enlarged or suspicious lymph nodes elsewhere within the neck.  He saw Dr. Constance [name] 01/06/22 and during this visit, the patient reported first noticing the left neck mass 2 days prior to presentation, and denied any symptoms related to it. Subsequently, Dr. Constance [name] performed on laryngoscopy on this same date which revealed no abnormal findings. FNA of left neck mass was also performed during this visit which revealed findings consistent with squamous cell carcinoma. PET completed 01/25/22 revealing fairly symmetric hypermetabolic activity in the lingual tonsil region; No clear primary mass lesion was identified. PET also showed an enlarged hypermetabolic left level 2 lymph node. No additional  enlarged or hypermetabolic cervical lymph nodes were appreciated, nor any signs of distant metastatic disease.  Bilateral lingual tonsil SUV uptake. Recommendation by Dr. Constance [name] inlcuded proceeding with directed biopsies and modified neck dissection under anesthesia.  Biopsies completed 03/03/22 with Dr. Constance [name] revealing Left neck-inferior jugular vein dissection: Metastatic squamous cell carcinoma involving 3/23 lymph nodes, with 2 lymph nodes from level II and 1 lymph node from  level III involved by carcinoma (p16 positive; EBV negative). No evidence of extranodal extension; jugular vein negative for carcinoma; salivary gland negative for carcinoma. -- Biopsies of the vallecula, left tongue base, left tonsil, and nasopharynx negative for carcinoma. 03/31/22 Reconsult with Dr. Isidore [name]. It was decided that he will receive radiation, no chemotherapy, with tx plan 30 fractions of radiation to his oropharynx and bilateral neck. He started on 04/12/22 and will complete on 05/25/22.  PAIN:  Are you having pain? Yes: NPRS scale: 5/10 Pain location: tongue Pain description: burning Aggravating factors: eating/drinking, swallowing  Relieving factors: medicine, saltwater rinse   PATIENT GOALS Maintain WNL swallow function  OBJECTIVE:   DIAGNOSTIC FINDINGS: See above in "pertinent history"  TODAY'S TREATMENT:  06/21/22: Pt states he has been completing HEP approx x3/week; SLP reminded pt about rationale for HEP as prescribed - told pt at least 20 reps of each exercise/day with no less than 7 reps at one time. Pt was mod I with HEP procedure. [place] is hindering PO intake - SLP suggested pt reschedule his dietary appt he missed yesterday and suggested tyring foods he can tolerate and explained a food journal to pt and suggested it's use. Lastly, SLP provided info about aspiration PNA.  05/25/22: Drank water sips x8 without any overt s/sx oral or pharyngeal deficits. Did not report any overt s/sx aspiration with liquids. He politely refused solids due to taste and thickened saliva. Pt stated he has been performing HEP as prescribed. He completed HEP with modified indpendence. Brad [name] told SLP that [place] was physically challenging for him so SLP taught him chin-fist (CTAR) with which he was independent after one rep. "I've been doing that one (chin-fist) anyway," he said. He was reminded about cycling through exercises instead of performing all reps at once, if necessary, and pt told SLP he has  not yet needed to do this. He told SLP rationale for HEP without cues.   04/20/22: Research states the risk for dysphagia increases due to radiation and/or chemotherapy treatment due to a variety of factors, so SLP educated the pt about the possibility of reduced/limited ability for PO intake during rad tx. SLP also educated pt regarding possible changes to swallowing musculature after rad tx, and why adherence to dysphagia HEP provided today and PO consumption was necessary to inhibit muscle fibrosis following rad tx and to mitigate muscle disuse atrophy. SLP informed pt why this would be detrimental to their swallowing status and to their pulmonary health. Pt demonstrated understanding of these things to SLP. SLP encouraged pt to safely eat and drink as deep into their radiation/chemotherapy as possible to provide the best possible long-term swallowing outcome for pt.   SLP then developed an individualized HEP for pt involving oral and pharyngeal strengthening and ROM and pt was instructed how to perform these exercises, including SLP demonstration. After SLP demonstration, pt return demonstrated each exercise. SLP ensured pt performance was correct prior to educating pt on next exercise. Pt required usual min-mod cues faded to modified independent to perform HEP. Pt was instructed to complete this program 6-7 days/week, at  least 2 times a day until 6 months after his last day of rad tx, and then x2 a week after that, indefinitely. Among other modifications for days when pt cannot functionally swallow, SLP also suggested pt to perform only non-swallowing tasks on the handout/HEP, and if necessary to cycle through the swallowing portion so the full program of exercises can be completed instead of fatiguing on one of the swallowing exercises and being unable to perform the other swallowing exercises. SLP instructed that swallowing exercises should then be added back into the regimen as pt is able to do so.    PATIENT EDUCATION: Education details: see above in "today's treatment" Person educated: Patient Education method: Explanation, demo Education comprehension: verbalized understanding, return demo'd   ASSESSMENT:  CLINICAL IMPRESSION: Patient is a 71 y.o. male who was seen today for assessment of swallowing after completion of radiation therapy on 05-25-22. Today pt drank peanut butter crackers and thin liquids without overt s/s oral or pharyngeal difficulty. At this time pt swallowing is deemed WNL/WFL with these POs.  There are no overt s/s aspiration PNA observed by SLP nor any reported by pt at this time. Data indicate that pt's swallow ability will likely decrease  over time following the conclusion of radiation therapy due to muscle fibrosis and/or muscle disuse atrophy. Pt will cont to need to be seen by SLP in order to assess safety of PO intake, assess the need for recommending any objective swallow assessment, and ensuring pt is correctly completing the individualized HEP.  OBJECTIVE IMPAIRMENTS include dysphagia. These impairments are limiting patient from safety when swallowing. Factors affecting potential to achieve goals and functional outcome are  none . Patient will benefit from skilled SLP services to address above impairments and improve overall function.  REHAB POTENTIAL: Excellent   GOALS: Goals reviewed with patient? No  SHORT TERM GOALS: Target completion:  first 3 therapy sessions (overall visit #4)      pt will complete HEP with rare min A  Baseline: Goal status: Met  2.  pt will tell SLP why pt is completing HEP with modified independence Baseline:  Goal status: Met  3.  pt will describe 3 overt s/s aspiration PNA with modified independence Baseline:  Goal status: Met  4.  pt will tell SLP how a food journal could hasten return to a more normalized diet Baseline:  Goal status: Met   LONG TERM GOALS: Target: sixth ST session (overall visit #7)  pt  will complete HEP with modified independence over two visits Baseline:  Goal status: Ongoing  2.  pt will describe how to modify HEP over time, and the timeline associated with reduction in HEP frequency with modified independence over two sessions Baseline:  Goal status: Ongoing   PLAN: SLP FREQUENCY:  once every approx 4 weeks  SLP DURATION:  7 sessions  PLANNED INTERVENTIONS: Aspiration precaution training, Pharyngeal strengthening exercises, Diet toleration management , Trials of upgraded texture/liquids, SLP instruction and feedback, Compensatory strategies, and Patient/family education    [facility], [place], [place] 06/21/2022, 10:55 AM

## 2022-07-10 NOTE — Progress Notes (Signed)
                                                                                                                                                             Patient Name: KHADIM LUNDBERG MRN: [id number] DOB: 04-Mar-1951 Referring Physician: Lujean Amel (Profile Not Attached) Date of Service: 05/25/2022 [facility], [place]                                                        End Of Treatment Note  Diagnoses: C77.0-Secondary and unspecified malignant neoplasm of lymph nodes of head, face and neck  Cancer Staging:  Cancer Staging  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 ([place]) Staging form: Pharynx - HPV-Mediated Oropharynx, AJCC 8th Edition - Pathologic stage from 03/31/2022: Stage I (pT0, pN1, cM0, p16+) - Signed by Eppie Gibson, MD on 03/31/2022 Stage prefix: Initial diagnosis  Intent: Curative  Radiation Treatment Dates: 04/12/2022 through 05/25/2022 Site Technique Total Dose (Gy) Dose per Fx (Gy) Completed Fx Beam Energies  Neck: HN_orophar IMRT 60/60 2 30/30 6X   Narrative: The patient tolerated radiation therapy relatively well.   Plan: The patient will follow-up with radiation oncology in 2-3wks .  -----------------------------------  Eppie Gibson, MD

## 2022-07-26 ENCOUNTER — Ambulatory Visit: Payer: Medicare Other | Attending: Radiation Oncology

## 2022-07-26 DIAGNOSIS — R1310 Dysphagia, unspecified: Secondary | ICD-10-CM | POA: Insufficient documentation

## 2022-07-26 NOTE — Therapy (Signed)
OUTPATIENT SPEECH LANGUAGE PATHOLOGY ONCOLOGY TREATMENT SESSION/Renewal-discharge summary   Patient Name: Brad [name] MRN: [id number] DOB:01-20-1951, 71 y.o., male Today's Date: 07/26/2022  PCP: Lujean Amel, MD REFERRING PROVIDER: Eppie Gibson, MD   End of Session - 07/26/22 1216     Visit Number 4    Number of Visits 4    Date for SLP Re-Evaluation 07/26/22    SLP Start Time 1104    SLP Stop Time  1140    SLP Time Calculation (min) 36 min    Activity Tolerance Patient tolerated treatment well               Past Medical History:  Diagnosis Date   Allergy    Bronchitis    chronic   Cancer ([place])    Constipation    COPD (chronic obstructive pulmonary disease) ([place])    Dyspnea    Hypercholesteremia    Hypertension    Past Surgical History:  Procedure Laterality Date   COLONOSCOPY     FRACTURE SURGERY     right wrist   LARYNGOSCOPY AND ESOPHAGOSCOPY N/A 03/03/2022   Procedure: DIRECT LARYNGOSCOPY AND ESOPHAGOSCOPY BIOPSIES AND FROZEN SECTIONS;  Surgeon: Izora Gala, MD;  Location: [facility];  Service: ENT;  Laterality: N/A;   Lipo Suction     NASOPHARYNGOSCOPY N/A 03/03/2022   Procedure: NASOPHARYNGOSCOPY;  Surgeon: Izora Gala, MD;  Location: [facility];  Service: ENT;  Laterality: N/A;   POLYPECTOMY     RADICAL NECK DISSECTION Left 03/03/2022   Procedure: NECK DISSECTION;  Surgeon: Izora Gala, MD;  Location: [facility];  Service: ENT;  Laterality: Left;   Patient Active Problem List   Diagnosis Date Noted   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 LTGs will be inforce today and then pt will be d/c'd.   Remaining deficits: None detected today.   Education / Equipment: HEP procedure, overt s/sx aspiration PNA, rationale for HEP, late effects head/neck radiation on swallowing  Patient agrees to discharge. Patient goals were partially met. Patient is being discharged due to  eating wide variety of foods and performing HEP with modified independence..      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 He presented to his PCP on 01/05/22 with left neck lymphadenopathy. CT neck completed same day revealed an enlarged and heterogeneous left leel 2 lymph node measuring 5.0 X 2.8 cm noted as highly suspicious for nodal metastatic disease. No definite primary neoplasm was otherwise identified within the oral cavity, pharynx or larynx, or any other enlarged or suspicious lymph nodes elsewhere within the neck.  He saw Dr. Constance Holster 01/06/22 and during this visit, the patient reported first noticing the left neck  mass 2 days prior to presentation, and denied any symptoms related to it. Subsequently, Dr. Constance Holster performed on laryngoscopy on this same date which revealed no abnormal findings. FNA of left neck mass was also performed during this visit which revealed findings consistent with squamous cell carcinoma. PET completed 01/25/22 revealing fairly symmetric hypermetabolic activity in the lingual tonsil region; No clear primary mass lesion was identified. PET also showed an enlarged hypermetabolic left level 2  lymph node. No additional enlarged or hypermetabolic cervical lymph nodes were appreciated, nor any signs of distant metastatic disease.  Bilateral lingual tonsil SUV uptake. Recommendation by Dr. Constance Holster inlcuded proceeding with directed biopsies and modified neck dissection under anesthesia.  Biopsies completed 03/03/22 with Dr. Constance Holster revealing Left neck-inferior jugular vein dissection: Metastatic squamous cell carcinoma involving 3/23 lymph nodes, with 2 lymph nodes from level II and 1 lymph node from level III involved by carcinoma (p16 positive; EBV negative). No evidence of extranodal extension; jugular vein negative for carcinoma; salivary gland negative for carcinoma. -- Biopsies of the vallecula, left tongue base, left tonsil, and nasopharynx negative for carcinoma. 03/31/22 Reconsult with Dr. Isidore Moos. It was decided that he will receive radiation, no chemotherapy, with tx plan 30 fractions of radiation to his oropharynx and bilateral neck. He started on 04/12/22 and will complete on 05/25/22.  PAIN:  Are you having pain? No   PATIENT GOALS Maintain WNL swallow function  OBJECTIVE:   DIAGNOSTIC FINDINGS: See above in "pertinent history"  TODAY'S TREATMENT:  07/26/22: Pt eating a wide variety of foods - taste is coming back which is positively affecting POs. Pt gained two pounds since last time he weighed himself. "I can work (HEP) into my routine," pt stated, and SLP strongly agreed this would be best case scenario. SLP provided pt with overt s/sx aspiration PNA and pt told SLP 3 with modified independence. Pt completed HEP suboptimal frequency, so SLP again provided rationale for AT LEAST 5 days/week of at least 20 reps of each exercise. Pt told SLP when frequency could be decreased (six months after last day of radiation) however SLP suggested 6 months from today as pt has never completed HEP with recommended frequency. He req'd min A initially, improved to modified independent with HEP.  06/21/22: Pt  states he has been completing HEP approx x3/week; SLP reminded pt about rationale for HEP as prescribed - told pt at least 20 reps of each exercise/day with no less than 7 reps at one time. Pt was mod I with HEP procedure. Ageusia is hindering PO intake - SLP suggested pt reschedule his dietary appt he missed yesterday and suggested tyring foods he can tolerate and explained a food journal to pt and suggested it's use. Lastly, SLP provided info about aspiration PNA.  05/25/22: Drank water sips x8 without any overt s/sx oral or pharyngeal deficits. Did not report any overt s/sx aspiration with liquids. He politely refused solids due to taste and thickened saliva. Pt stated he has been performing HEP as prescribed. He completed HEP with modified indpendence. [name] told SLP that [place] was physically challenging for him so SLP taught him chin-fist (CTAR) with which he was independent after one rep. "I've been doing that one (chin-fist) anyway," he said. He was reminded about cycling through exercises instead of performing all reps at once, if necessary, and pt told SLP he has not yet needed to do this. He told SLP rationale for HEP without cues.   04/20/22: Research states the risk for dysphagia increases  due to radiation and/or chemotherapy treatment due to a variety of factors, so SLP educated the pt about the possibility of reduced/limited ability for PO intake during rad tx. SLP also educated pt regarding possible changes to swallowing musculature after rad tx, and why adherence to dysphagia HEP provided today and PO consumption was necessary to inhibit muscle fibrosis following rad tx and to mitigate muscle disuse atrophy. SLP informed pt why this would be detrimental to their swallowing status and to their pulmonary health. Pt demonstrated understanding of these things to SLP. SLP encouraged pt to safely eat and drink as deep into their radiation/chemotherapy as possible to provide the best possible long-term  swallowing outcome for pt.   SLP then developed an individualized HEP for pt involving oral and pharyngeal strengthening and ROM and pt was instructed how to perform these exercises, including SLP demonstration. After SLP demonstration, pt return demonstrated each exercise. SLP ensured pt performance was correct prior to educating pt on next exercise. Pt required usual min-mod cues faded to modified independent to perform HEP. Pt was instructed to complete this program 6-7 days/week, at least 2 times a day until 6 months after his last day of rad tx, and then x2 a week after that, indefinitely. Among other modifications for days when pt cannot functionally swallow, SLP also suggested pt to perform only non-swallowing tasks on the handout/HEP, and if necessary to cycle through the swallowing portion so the full program of exercises can be completed instead of fatiguing on one of the swallowing exercises and being unable to perform the other swallowing exercises. SLP instructed that swallowing exercises should then be added back into the regimen as pt is able to do so.   PATIENT EDUCATION: Education details: see above in "today's treatment" Person educated: Patient Education method: Explanation, demo Education comprehension: verbalized understanding, return demo'd   ASSESSMENT:  CLINICAL IMPRESSION: Patient is a 71 y.o. male who was seen today for assessment of swallowing after completion of radiation therapy on 05-25-22. Today pt ate peanut butter crackers and drank thin liquids without overt s/s oral or pharyngeal difficulty. At this time pt swallowing is deemed WNL/WFL with these POs.  There are no overt s/s aspiration PNA observed by SLP nor any reported by pt at this time. Data indicate that pt's swallow ability will likely decrease  over time following the conclusion of radiation therapy due to muscle fibrosis and/or muscle disuse atrophy. Pt agrees with d/c today.  OBJECTIVE IMPAIRMENTS include  dysphagia. These impairments are limiting patient from safety when swallowing. Factors affecting potential to achieve goals and functional outcome are  none . Patient will benefit from skilled SLP services to address above impairments and improve overall function.  REHAB POTENTIAL: Excellent   GOALS: Goals reviewed with patient? No  SHORT TERM GOALS: Target completion:  first 3 therapy sessions (overall visit #4)      pt will complete HEP with rare min A  Baseline: Goal status: Met  2.  pt will tell SLP why pt is completing HEP with modified independence Baseline:  Goal status: Met  3.  pt will describe 3 overt s/s aspiration PNA with modified independence Baseline:  Goal status: Met  4.  pt will tell SLP how a food journal could hasten return to a more normalized diet Baseline:  Goal status: Met   LONG TERM GOALS: Target: sixth ST session (overall visit #7)  pt will complete HEP with modified independence over two visits Baseline:  Goal status: partially met  2.  pt will describe how to modify HEP over time, and the timeline associated with reduction in HEP frequency with modified independence over two sessions Baseline:  Goal status: Paritally met   PLAN: Discharge today - pt is mod I with exercises and is eating wide variety of foods.  PLANNED INTERVENTIONS: Aspiration precaution training, Pharyngeal strengthening exercises, Diet toleration management , Trials of upgraded texture/liquids, SLP instruction and feedback, Compensatory strategies, and Patient/family education    [facility], [place] 07/26/2022, 12:17 PM

## 2022-07-26 NOTE — Patient Instructions (Signed)
   If you have: Difficulty with food or liquid passing, that once was easy to pass through the throat, and/or  Increased frequency of coughing and/or throat clearing with food or liquids, and/or  Signs or symptoms of aspiration pneumonia (below) - -   YOU WILL WANT TO CONTACT ONE OF YOUR MD's WHO IS STILL FOLLOWING YOU (can even be your PCP) - they will likely want you to do a swallow test ("a *modified* barium swallow exam")  ===========================================  Signs of Aspiration Pneumonia   Chest pain/tightness Fever (can be low grade) Cough  With foul-smelling phlegm (sputum) With sputum containing pus or blood With greenish sputum Fatigue  Shortness of breath  Wheezing   **IF YOU HAVE THESE SIGNS, CONTACT YOUR DOCTOR OR GO TO THE EMERGENCY DEPARTMENT OR URGENT CARE AS SOON AS POSSIBLE**

## 2022-08-14 ENCOUNTER — Telehealth: Payer: Self-pay | Admitting: *Deleted

## 2022-08-14 NOTE — Telephone Encounter (Signed)
CALLED PATIENT TO INFORM OF STAT LABS ON 08-25-22 @ 2:30 PM @ [facility] AND HIS CT TO FOLLOW @ [facility], ARRIVAL TIME- 3:30 PM , PATIENT TO HAVE WATER ONLY- 4 HRS. PRIOR TO TEST, AND PATIENT TO RECEIVE RESULTS FROM DR. SQUIRE ON 08-29-22 @ 2:20 PM, SPOKE WITH PATIENT AND HE IS AWARE OF THESE APPTS. AND THE INSTRUCTIONS

## 2022-08-18 ENCOUNTER — Telehealth: Payer: Self-pay

## 2022-08-18 NOTE — Telephone Encounter (Signed)
Opened in error

## 2022-08-25 ENCOUNTER — Ambulatory Visit
Admission: RE | Admit: 2022-08-25 | Discharge: 2022-08-25 | Disposition: A | Discharge status: Home / Self Care | Payer: Medicare Other | Source: Ambulatory Visit | Attending: Internal Medicine | Admitting: Internal Medicine

## 2022-08-25 ENCOUNTER — Other Ambulatory Visit: Payer: Self-pay

## 2022-08-25 ENCOUNTER — Ambulatory Visit (HOSPITAL_COMMUNITY)
Admission: RE | Admit: 2022-08-25 | Discharge: 2022-08-25 | Disposition: A | Discharge status: Home / Self Care | Payer: Medicare Other | Source: Ambulatory Visit | Attending: Radiation Oncology | Admitting: Radiation Oncology

## 2022-08-25 DIAGNOSIS — F17210 Nicotine dependence, cigarettes, uncomplicated: Secondary | ICD-10-CM | POA: Insufficient documentation

## 2022-08-25 DIAGNOSIS — Z79899 Other long term (current) drug therapy: Secondary | ICD-10-CM | POA: Insufficient documentation

## 2022-08-25 DIAGNOSIS — C770 Secondary and unspecified malignant neoplasm of lymph nodes of head, face and neck: Secondary | ICD-10-CM | POA: Insufficient documentation

## 2022-08-25 DIAGNOSIS — Z7982 Long term (current) use of aspirin: Secondary | ICD-10-CM | POA: Insufficient documentation

## 2022-08-25 DIAGNOSIS — C801 Malignant (primary) neoplasm, unspecified: Secondary | ICD-10-CM | POA: Diagnosis present

## 2022-08-25 DIAGNOSIS — C760 Malignant neoplasm of head, face and neck: Secondary | ICD-10-CM

## 2022-08-25 LAB — CBC WITH DIFFERENTIAL/PLATELET
Abs Immature Granulocytes: 0.02 10*3/uL (ref 0.00–0.07)
Basophils Absolute: 0 10*3/uL (ref 0.0–0.1)
Basophils Relative: 1 %
Eosinophils Absolute: 0.1 10*3/uL (ref 0.0–0.5)
Eosinophils Relative: 2 %
HCT: 37.6 % — ABNORMAL LOW (ref 39.0–52.0)
Hemoglobin: 12.5 g/dL — ABNORMAL LOW (ref 13.0–17.0)
Immature Granulocytes: 1 %
Lymphocytes Relative: 20 %
Lymphs Abs: 0.8 10*3/uL (ref 0.7–4.0)
MCH: 32.3 pg (ref 26.0–34.0)
MCHC: 33.2 g/dL (ref 30.0–36.0)
MCV: 97.2 fL (ref 80.0–100.0)
Monocytes Absolute: 0.5 10*3/uL (ref 0.1–1.0)
Monocytes Relative: 13 %
Neutro Abs: 2.6 10*3/uL (ref 1.7–7.7)
Neutrophils Relative %: 63 %
Platelets: 159 10*3/uL (ref 150–400)
RBC: 3.87 MIL/uL — ABNORMAL LOW (ref 4.22–5.81)
RDW: 14.1 % (ref 11.5–15.5)
WBC: 3.9 10*3/uL — ABNORMAL LOW (ref 4.0–10.5)
nRBC: 0 % (ref 0.0–0.2)

## 2022-08-25 LAB — BUN & CREATININE (CHCC)
BUN: 18 mg/dL (ref 8–23)
Creatinine: 1.03 mg/dL (ref 0.61–1.24)
GFR, Estimated: >60 mL/min (ref >60)

## 2022-08-25 MED ORDER — SODIUM CHLORIDE (PF) 0.9 % IJ SOLN
INTRAMUSCULAR | Status: AC
  Filled 2022-08-25: qty 50

## 2022-08-25 MED ORDER — IOHEXOL 300 MG/ML  SOLN
75.0000 mL | Freq: Once | INTRAMUSCULAR | Status: AC | PRN
  Administered 2022-08-25: 75 mL via INTRAVENOUS

## 2022-08-29 ENCOUNTER — Ambulatory Visit
Admission: RE | Admit: 2022-08-29 | Discharge: 2022-08-29 | Disposition: A | Discharge status: Home / Self Care | Payer: Medicare Other | Source: Ambulatory Visit | Attending: Radiation Oncology | Admitting: Radiation Oncology

## 2022-08-29 ENCOUNTER — Ambulatory Visit: Payer: Self-pay | Admitting: Radiation Oncology

## 2022-08-29 VITALS — BP 136/77 | HR 71 | Temp 98.1°F | Resp 18 | Ht 68.0 in | Wt 171.5 lb

## 2022-08-29 DIAGNOSIS — R682 Dry mouth, unspecified: Secondary | ICD-10-CM | POA: Diagnosis not present

## 2022-08-29 DIAGNOSIS — Z79899 Other long term (current) drug therapy: Secondary | ICD-10-CM | POA: Insufficient documentation

## 2022-08-29 DIAGNOSIS — I2510 Atherosclerotic heart disease of native coronary artery without angina pectoris: Secondary | ICD-10-CM | POA: Insufficient documentation

## 2022-08-29 DIAGNOSIS — Z7982 Long term (current) use of aspirin: Secondary | ICD-10-CM | POA: Insufficient documentation

## 2022-08-29 DIAGNOSIS — C770 Secondary and unspecified malignant neoplasm of lymph nodes of head, face and neck: Secondary | ICD-10-CM | POA: Diagnosis present

## 2022-08-29 DIAGNOSIS — C801 Malignant (primary) neoplasm, unspecified: Secondary | ICD-10-CM | POA: Insufficient documentation

## 2022-08-29 DIAGNOSIS — Z923 Personal history of irradiation: Secondary | ICD-10-CM | POA: Insufficient documentation

## 2022-08-29 DIAGNOSIS — I700 Atherosclerosis of aorta: Secondary | ICD-10-CM | POA: Diagnosis not present

## 2022-08-29 DIAGNOSIS — C760 Malignant neoplasm of head, face and neck: Secondary | ICD-10-CM

## 2022-08-29 NOTE — Progress Notes (Addendum)
Brad [name] presents today for follow-up after completing radiation to his oropharynx and bilateral neck nodes on 05/25/2022, and to review CT scan results from 08/25/2022  Pain issues, if any: Denies any mouth or throat pain; Does complain that his tongue feels raw/sensitive from frequent dry mouth Using a feeding tube?: N/A Weight changes, if any:  Wt Readings from Last 3 Encounters:  08/29/22 171 lb 8 oz (77.8 kg)  06/06/22 182 lb 6.4 oz (82.7 kg)  05/08/22 201 lb 6.4 oz (91.4 kg)   Swallowing issues, if any: Reports he's able to eat a wider variety, but still struggles with drier foods and meats. States as long as he has something to drink when he's eating he does fine Smoking or chewing tobacco? None Using fluoride trays daily? N/A Last ENT visit was on: 06/20/2022 Saw Dr. Izora Gala:  --Physical Exam:  Healthy-appearing in no distress. Breathing and voice are clear. Nasal exam is unremarkable. Oral cavity and pharynx are healthy and clear with slightly pasty secretions. Indirect exam of the tongue base hypopharynx and larynx is all normal. No palpable adenopathy.  --Impression & Plans:  Stable post treatment, no evidence of persistent disease. Repeat exam in 3 months or sooner as needed.   Other notable issues, if any: Reports energy has improved and he has been able to increase his activity (resumed going to the gym). Continues to deal with dry mouth and phlegm. Reports sense of taste is slowly returning. Denies any ear or jaw pain. Has some swelling under chin/down front of neck that may be related to lymphedema (would be interested in PT referral to help manage). Overall though, reports he feels good and is doing well

## 2022-08-30 ENCOUNTER — Other Ambulatory Visit: Payer: Self-pay

## 2022-08-30 ENCOUNTER — Encounter: Payer: Self-pay | Admitting: Radiation Oncology

## 2022-08-30 ENCOUNTER — Other Ambulatory Visit (HOSPITAL_COMMUNITY): Payer: Self-pay

## 2022-08-30 DIAGNOSIS — R5381 Other malaise: Secondary | ICD-10-CM

## 2022-08-30 DIAGNOSIS — C770 Secondary and unspecified malignant neoplasm of lymph nodes of head, face and neck: Secondary | ICD-10-CM

## 2022-08-30 DIAGNOSIS — R1310 Dysphagia, unspecified: Secondary | ICD-10-CM

## 2022-08-30 DIAGNOSIS — R059 Cough, unspecified: Secondary | ICD-10-CM

## 2022-08-30 NOTE — Progress Notes (Signed)
Radiation Oncology         [phone] ________________________________  Name: Brad [name] MRN: [id number]  Date: 08/29/2022  DOB: 12/02/1950  Follow-Up Visit Note  CC: Lujean Amel, MD  Lujean Amel, MD  Diagnosis and Prior Radiotherapy:       ICD-10-CM   1. Squamous cell carcinoma of head and neck (HCC)  C76.0     2. 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)  C77.0       Cancer Staging  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) Staging form: Pharynx - HPV-Mediated Oropharynx, AJCC 8th Edition - Pathologic stage from 03/31/2022: Stage I (pT0, pN1, cM0, p16+) - Signed by Eppie Gibson, MD on 03/31/2022 Stage prefix: Initial diagnosis   Radiation Treatment Dates: 04/12/2022 through 05/25/2022 Site Technique Total Dose (Gy) Dose per Fx (Gy) Completed Fx Beam Energies  Neck: HN_orophar IMRT 60/60 2 30/30 6X   CHIEF COMPLAINT:  Here for follow-up and surveillance of throat cancer  Narrative:  Brad [name] presents today for follow-up after completing radiation to his oropharynx and bilateral neck nodes on 05/25/2022, and to review CT scan results from 08/25/2022  Pain issues, if any: Denies any mouth or throat pain; Does complain that his tongue feels raw/sensitive from frequent dry mouth Using a feeding tube?: N/A Weight changes, if any:  Wt Readings from Last 3 Encounters:  08/29/22 171 lb 8 oz (77.8 kg)  06/06/22 182 lb 6.4 oz (82.7 kg)  05/08/22 201 lb 6.4 oz (91.4 kg)   Swallowing issues, if any: Reports he's able to eat a wider variety, but still struggles with drier foods and meats. States as long as he has something to drink when he's eating he does fine Smoking or chewing tobacco? None Using fluoride trays daily? N/A Last ENT visit was on: 06/20/2022 Saw Dr. Izora Gala:  --Physical Exam:  Healthy-appearing in no distress. Breathing and voice are clear. Nasal exam is unremarkable. Oral cavity and pharynx are healthy and clear with slightly pasty  secretions. Indirect exam of the tongue base hypopharynx and larynx is all normal. No palpable adenopathy.  --Impression & Plans:  Stable post treatment, no evidence of persistent disease. Repeat exam in 3 months or sooner as needed.   Other notable issues, if any: Reports energy has improved and he has been able to increase his activity (resumed going to the gym). Continues to deal with dry mouth and phlegm. Reports sense of taste is slowly returning. Denies any ear or jaw pain. Has some swelling under chin/down front of neck that may be related to lymphedema (would be interested in PT referral to help manage). Overall though, reports he feels good and is doing well        ALLERGIES:  has No Known Allergies.  Meds: Current Outpatient Medications  Medication Sig Dispense Refill   albuterol (PROVENTIL HFA;VENTOLIN HFA) 108 (90 BASE) MCG/ACT inhaler Inhale 2 puffs into the lungs every 6 (six) hours as needed. shortness of breath     aspirin 81 MG chewable tablet Chew 81 mg by mouth in the morning.     Budeson-Glycopyrrol-Formoterol (BREZTRI AEROSPHERE) 160-9-4.8 MCG/ACT AERO Inhale 2 puffs into the lungs in the morning and at bedtime.     Cholecalciferol (VITAMIN D3 PO) Take 1 tablet by mouth in the morning.     Cyanocobalamin (VITAMIN B-12 PO) Take 1 tablet by mouth in the morning.     HYDROcodone-acetaminophen (NORCO) 7.5-325 MG tablet Take 1 tablet by mouth every 6 (six) hours  as needed for moderate pain. 20 tablet 0   lidocaine (XYLOCAINE) 2 % solution Patient: Mix 1part 2% viscous lidocaine, 1part H20. Swish & swallow 78m of diluted mixture, 313m before meals and at bedtime, up to QID 200 mL 3   losartan-hydrochlorothiazide (HYZAAR) 50-12.5 MG tablet Take 1 tablet by mouth in the morning. (Patient not taking: Reported on 06/08/2022)     Multiple Vitamin (MULTIVITAMIN WITH MINERALS) TABS Take 1 tablet by mouth in the morning.     nicotine (NICODERM CQ - DOSED IN MG/24 HOURS) 14 mg/24hr patch  Place 1 patch (14 mg total) onto the skin daily. Apply 21 mg patch daily x 6 wk, then '14mg'$  patch daily x 2 wk, then 7 mg patch daily x 2 wk 14 patch 0   nicotine (NICODERM CQ - DOSED IN MG/24 HOURS) 21 mg/24hr patch Place 1 patch (21 mg total) onto the skin daily. Apply 21 mg patch daily x 6 wk, then '14mg'$  patch daily x 2 wk, then 7 mg patch daily x 2 wk 14 patch 2   nicotine (NICODERM CQ - DOSED IN MG/24 HR) 7 mg/24hr patch Place 1 patch (7 mg total) onto the skin daily. Apply 21 mg patch daily x 6 wk, then '14mg'$  patch daily x 2 wk, then 7 mg patch daily x 2 wk 14 patch 0   ondansetron (ZOFRAN-ODT) 8 MG disintegrating tablet Take 1 tablet (8 mg total) by mouth every 8 (eight) hours as needed for nausea or vomiting. 20 tablet 1   rosuvastatin (CRESTOR) 20 MG tablet Take 20 mg by mouth in the morning.     sildenafil (VIAGRA) 100 MG tablet Take 100 mg by mouth daily as needed for erectile dysfunction.     tamsulosin (FLOMAX) 0.4 MG CAPS capsule Take 0.4 mg by mouth in the morning.     Testosterone 20.25 MG/ACT (1.62%) GEL Apply 1 Pump topically in the morning.     vitamin E 400 UNIT capsule Take 400 Units by mouth in the morning.     No current facility-administered medications for this encounter.    Physical Findings: The patient is in no acute distress. Patient is alert and oriented. Wt Readings from Last 3 Encounters:  08/29/22 171 lb 8 oz (77.8 kg)  06/06/22 182 lb 6.4 oz (82.7 kg)  05/08/22 201 lb 6.4 oz (91.4 kg)    height is '5\' 8"'$  (1.727 m) and weight is 171 lb 8 oz (77.8 kg). His oral temperature is 98.1 F (36.7 C). His blood pressure is 136/77 and his pulse is 71. His respiration is 18 and oxygen saturation is 99%. .  General: Alert and oriented, in no acute distress HEENT: Head is normocephalic. Extraocular movements are intact. Mucosa negative for tumor in upper throat/mouth Neck: Neck is notable for no palpable masses; anterior neck lymphedema is present Skin: smooth, intact over  neck. Lymphatics: see Neck Exam Psychiatric: Judgment and insight are intact. Affect is appropriate.   Lab Findings: Lab Results  Component Value Date   WBC 3.9 (L) 08/25/2022   HGB 12.5 (L) 08/25/2022   HCT 37.6 (L) 08/25/2022   MCV 97.2 08/25/2022   PLT 159 08/25/2022    Lab Results  Component Value Date   TSH 1.955 03/31/2022    Radiographic Findings: CT Soft Tissue Neck W Contrast  Result Date: 08/28/2022 CLINICAL DATA:  [age]ear old male restaging HPV positive squamous cell carcinoma diagnosed in February this year with metastatic left lymphadenopathy at that time. No primary tumor site  has been identified. Status post IMRT completed in July. EXAM: CT NECK WITH CONTRAST TECHNIQUE: Multidetector CT imaging of the neck was performed using the standard protocol following the bolus administration of intravenous contrast. RADIATION DOSE REDUCTION: This exam was performed according to the departmental dose-optimization program which includes automated exposure control, adjustment of the mA and/or kV according to patient size and/or use of iterative reconstruction technique. CONTRAST:  42m OMNIPAQUE IOHEXOL 300 MG/ML SOLN in conjunction with contrast enhanced imaging of the chest reported separately. COMPARISON:  PET-CT 01/25/2022, Neck CT 01/05/2022. FINDINGS: Pharynx and larynx: Generalized pharyngeal mucosal space soft tissue thickening compatible with radiation therapy. Less pronounced radiation changes in the parapharyngeal and retropharyngeal spaces. No discrete pharyngeal or laryngeal mass or hyperenhancement. Stable asymmetry of the vocal cords since February. Salivary glands: Post radiation changes, including atrophy of the left submandibular gland. Less pronounced parotid gland changes. Sublingual space remains negative. Thyroid: Negative. Lymph nodes: Generalized superficial soft tissue edema in the anterior neck compatible with radiation sequelae. Marked regression of the dominant  left level 2 malignant lymph node or ex nodal disease since February, with indistinct residual soft tissue there on series 3, image 53. No new or increased lymphadenopathy in the neck. Vascular: Possible segmental occlusion of the left IJ now in the area of previous dominant left neck nodal disease. Both the IJ bulb at the skull base and left IJ at the thoracic inlet are enhancing and remain patent. Other major vascular structures in the neck and at the skull base remain patent. Tortuous distal left ICA again noted. Bilateral carotid bifurcation atherosclerotic plaque appears stable. Limited intracranial: Calcified atherosclerosis at the skull base. Negative visible brain parenchyma. Visualized orbits: Negative. Mastoids and visualized paranasal sinuses: Visualized paranasal sinuses and mastoids are stable and well aerated. Skeleton: No acute or suspicious osseous lesion. Stable cervical spine degeneration. Upper chest: Dedicated Chest CT reported separately. IMPRESSION: 1. Post radiation sequelae and marked regression of Left level 2 malignant node / ex-nodal disease since February. NI-RADS category 1. 2. Probable segmental occlusion of the Left Internal Jugular Vein in the area of treated left nodal disease. 3. CT Chest the same day is reported separately. Electronically Signed   By: HGenevie AnnM.D.   On: 08/28/2022 10:17   CT Chest W Contrast  Result Date: 08/27/2022 CLINICAL DATA:  71year old male with history of squamous cell carcinoma of the head and neck. Follow-up study. * Tracking Code: BO * EXAM: CT CHEST WITH CONTRAST TECHNIQUE: Multidetector CT imaging of the chest was performed during intravenous contrast administration. RADIATION DOSE REDUCTION: This exam was performed according to the departmental dose-optimization program which includes automated exposure control, adjustment of the mA and/or kV according to patient size and/or use of iterative reconstruction technique. CONTRAST:  773mOMNIPAQUE  IOHEXOL 300 MG/ML  SOLN COMPARISON:  PET-CT 01/25/2022. FINDINGS: Cardiovascular: Heart size is normal. There is no significant pericardial fluid, thickening or pericardial calcification. There is aortic atherosclerosis, as well as atherosclerosis of the great vessels of the mediastinum and the coronary arteries, including calcified atherosclerotic plaque in the left main, left anterior descending, left circumflex and right coronary arteries. Mediastinum/Nodes: No pathologically enlarged mediastinal or hilar lymph nodes. Esophagus is unremarkable in appearance. No axillary lymphadenopathy. Lungs/Pleura: Scattered areas of bronchial wall thickening, thickening of the peribronchovascular interstitial, peribronchovascular ground-glass attenuation and regional architectural distortion are noted in the lung bases, new compared to the prior study. No definite suspicious appearing pulmonary nodules or masses are noted. No acute consolidative airspace  disease. No pleural effusions. Upper Abdomen: Aortic atherosclerosis. Incompletely imaged low-attenuation lesion in the left kidney, previously not hypermetabolic on PET-CT, likely a cyst (no imaging follow-up is recommended). Musculoskeletal: There are no aggressive appearing lytic or blastic lesions noted in the visualized portions of the skeleton. IMPRESSION: 1. No definitive findings to suggest metastatic disease in the thorax. 2. The appearance of the lung bases may suggest sequela of recent aspiration. Clinical correlation is recommended, and consideration for follow-up evaluation with nonemergent swallow study by speech therapy is suggested if clinically appropriate. 3. Aortic atherosclerosis, in addition to left main and three-vessel coronary artery disease. Assessment for potential risk factor modification, dietary therapy or pharmacologic therapy may be warranted, if clinically indicated. Aortic Atherosclerosis (ICD10-I70.0). Electronically Signed   By: Vinnie Langton M.D.   On: 08/27/2022 14:00    Impression/Plan:    1) Head and Neck Cancer Status: He is in remission  2) Nutritional Status: No active issues.  His weight is lower than baseline but he is eating a wide variety of food and has no complaints  3) Risk Factors: The patient has been educated about risk factors including alcohol and tobacco abuse; they understand that avoidance of alcohol and tobacco is important to prevent recurrences as well as other cancers -I applauded him on continued cessation from smoking  4) Swallowing: Continue exercises to prevent dysphagia -compensate with liquids as needed to swallow drier foods  5) Dental: Encouraged to continue regular followup with dentistry, and dental hygiene including fluoride rinses.   6) Thyroid function: Check annually Lab Results  Component Value Date   TSH 1.955 03/31/2022    7) Other: He has lymphedema in his anterior neck.  Refer to physical therapy  8) I personally reviewed his imaging.  I sent a note to Dr. Constance Holster, who sees him next month, as that imaging of his neck shows possible occlusion of the left jugular vein.  I am not sure if this may be related to his previous surgery.  It would not be an expected side effect of the radiation.  We will obtain repeat CT imaging of neck and chest in 6 months and I will see him back at that time with TSH lab, sooner if needed   On date of service, in total, I spent 30 minutes on this encounter. Patient was seen in person.  This note was signed the day after his encounter.  Time noted is in reference to the date of service only. _____________________________________   Eppie Gibson, MD

## 2022-08-30 NOTE — Progress Notes (Signed)
Oncology Nurse Navigator Documentation   I met with Mr. Brad [name] before and during his appointment with Dr. Isidore Moos today. He received results of his recent CT scans. At Dr. Pearlie Oyster request I have placed a referral to PT for his neck lymphedema, ordered a swallowing study, gotten him scheduled to see Dr. Isidore Moos in 6 months for follow up with TSH and CT neck/chest to be completed before the appointment. He knows to call me if he has any further needs.   Harlow Asa RN, BSN, OCN Head & Neck Oncology Nurse [place] at [facility] Phone # [phone]  Fax # [phone]

## 2022-09-05 ENCOUNTER — Other Ambulatory Visit: Payer: Self-pay

## 2022-09-05 ENCOUNTER — Ambulatory Visit: Payer: Medicare Other | Attending: Radiation Oncology | Admitting: Physical Therapy

## 2022-09-05 ENCOUNTER — Encounter: Payer: Self-pay | Admitting: Physical Therapy

## 2022-09-05 DIAGNOSIS — C770 Secondary and unspecified malignant neoplasm of lymph nodes of head, face and neck: Secondary | ICD-10-CM | POA: Diagnosis not present

## 2022-09-05 DIAGNOSIS — Z483 Aftercare following surgery for neoplasm: Secondary | ICD-10-CM | POA: Diagnosis not present

## 2022-09-05 DIAGNOSIS — R293 Abnormal posture: Secondary | ICD-10-CM | POA: Insufficient documentation

## 2022-09-05 DIAGNOSIS — I890 Lymphedema, not elsewhere classified: Secondary | ICD-10-CM | POA: Diagnosis not present

## 2022-09-05 NOTE — Therapy (Signed)
OUTPATIENT PHYSICAL THERAPY ONCOLOGY EVALUATION  Patient Name: Brad [name] MRN: [id number] DOB:September 08, 1951, 71 y.o., male Today's Date: 09/05/2022   PT End of Session - 09/05/22 1509     Visit Number 1    Number of Visits 9    Date for PT Re-Evaluation 10/03/22    PT Start Time 1507    PT Stop Time 1545    PT Time Calculation (min) 38 min    Activity Tolerance Patient tolerated treatment well    Behavior During Therapy WFL for tasks assessed/performed             Past Medical History:  Diagnosis Date   Allergy    Bronchitis    chronic   Cancer ([place])    Constipation    COPD (chronic obstructive pulmonary disease) ([place])    Dyspnea    Hypercholesteremia    Hypertension    Past Surgical History:  Procedure Laterality Date   COLONOSCOPY     FRACTURE SURGERY     right wrist   LARYNGOSCOPY AND ESOPHAGOSCOPY N/A 03/03/2022   Procedure: DIRECT LARYNGOSCOPY AND ESOPHAGOSCOPY BIOPSIES AND FROZEN SECTIONS;  Surgeon: Izora Gala, MD;  Location: [place];  Service: ENT;  Laterality: N/A;   Lipo Suction     NASOPHARYNGOSCOPY N/A 03/03/2022   Procedure: NASOPHARYNGOSCOPY;  Surgeon: Izora Gala, MD;  Location: [place];  Service: ENT;  Laterality: N/A;   POLYPECTOMY     RADICAL NECK DISSECTION Left 03/03/2022   Procedure: NECK DISSECTION;  Surgeon: Izora Gala, MD;  Location: [place];  Service: ENT;  Laterality: Left;   Patient Active Problem List   Diagnosis Date Noted   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 ([place]) 03/03/2022   Secondary and unspecified malignant neoplasm of lymph nodes of head, face and neck ([place]) 01/30/2022   SOB (shortness of breath) 05/19/2012   Cough 05/19/2012   COPD (chronic obstructive pulmonary disease) ([place]) 05/19/2012   HTN (hypertension) 05/19/2012   COPD exacerbation ([place]) 05/19/2012    PCP: Lujean Amel, MD   REFERRING PROVIDER: Eppie Gibson, MD  REFERRING DIAG: C77.0 (ICD-10-CM) - Secondary and unspecified malignant neoplasm of lymph nodes  of head, face and neck ([place])  THERAPY DIAG:  Lymphedema, not elsewhere classified  Aftercare following surgery for neoplasm  Abnormal posture  Metastasis to cervical lymph node ([place])  ONSET DATE: 06/20/22  Rationale for Evaluation and Treatment Rehabilitation  SUBJECTIVE:                                                                                                                                                                                           SUBJECTIVE STATEMENT:  I did not give the neck much thought. I did not realize it was swelling. I just thought it was fat.  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 He presented to his PCP on 01/05/22 with left neck lymphadenopathy. CT neck completed same day revealed an enlarged and heterogeneous left leel 2 lymph node measuring 5.0 X 2.8 cm noted as highly suspicious for nodal metastatic disease. No definite primary neoplasm was otherwise identified within the oral cavity, pharynx or larynx, or any other enlarged or suspicious lymph nodes elsewhere within the neck. 01/06/22 he saw Dr. Constance Holster for evaluation. During this visit, the patient reported first noticing the left neck mass 2 days prior to presentation, and denied any symptoms related to it. Subsequently, Dr. Constance Holster performed on laryngoscopy on this same date which revealed no abnormal findings. FNA of left neck mass was also performed during this visit which revealed findings consistent with squamous cell carcinoma. 01/25/22 PET completed revealing fairly symmetric hypermetabolic activity in the lingual tonsil region, No clear primary mass lesion was identified. PET also showed an enlarged hypermetabolic left level 2 lymph node measuring 24 mm in the short axis, though >3cm in greatest dimension to my eye, with an SUV max of 15.7. No additional enlarged or hypermetabolic cervical lymph nodes were appreciated. No signs of distant  metastatic disease.  Bilateral lingual tonsil SUV uptake. 03/03/22 Biopsies completed with Dr. Constance Holster revealing Left neck-inferior jugular vein dissection: Metastatic squamous cell carcinoma involving 3/23 lymph nodes, with 2 lymph nodes from level II and 1 lymph node from level III involved by carcinoma (p16 positive; EBV negative). No evidence of extranodal extension; jugular vein negative for carcinoma; salivary gland negative for carcinoma. -- Biopsies of the vallecula, left tongue base, left tonsil, and nasopharynx negative for carcinoma. He will receive 30 fractions of radiation to his Oropharynx and bilateral neck. He started on 04/12/22 and will complete on 05/25/22. PAIN:  Are you having pain? No  PRECAUTIONS: Other: anterior neck lymphedema  WEIGHT BEARING RESTRICTIONS: No  FALLS:  Has patient fallen in last 6 months? No  LIVING ENVIRONMENT: Lives with: lives with their spouse Lives in: House/apartment Stairs: Yes; Internal: 15 steps; on right going up Has following equipment at home: None  OCCUPATION: retired  LEISURE: gym 3 days per week minimum, warms up on stationary bike, does weight machine and free weights  HAND DOMINANCE: right   PRIOR LEVEL OF FUNCTION: Independent  PATIENT GOALS: to get rid of the swelling   OBJECTIVE:  COGNITION: Overall cognitive status: Within functional limits for tasks assessed   PALPATION: Increased fibrosis and fullness in anterior neck  OBSERVATIONS / OTHER ASSESSMENTS: fullness in anterior neck  POSTURE: forward head, rounded shoulders  SHOULDER AROM:   WFL, though L shoulder is limited in certain directions secondary to nerve damage during surgery   CERVICAL AROM:     Percent limited - 06/08/22 06/08/22 09/05/22  Flexion WFL WFL 25% limited secondary to edema  Extension 25% limited WFL 25% limited  Right lateral flexion 25% limited 25% limited WFL  Left lateral flexion 50% limited 25% limited 50% limited  Right rotation 25%  limited 25% limited 25% limited  Left rotation 25% limited 25% limited 25% limited                          (Blank rows=not tested)       LYMPHEDEMA ASSESSMENT:  Circumference in cm 06/08/22 09/05/22  4 cm superior to sternal notch around neck 39.8 41  6 cm superior to sternal notch around neck 41 43.2  8 cm superior to sternal notch around neck 43 45.5  (Blank rows=not tested)     CURRENT/PAST TREATMENTS:   Surgery type/date: L neck- inferior jugular vein dissection 3/23 nodes involved on 03/03/22   Radiation:completed 05/25/22      TODAY'S TREATMENT:  09/05/22- Instructed pt using Norton anterior approach as follows: short neck, 5 diaphragmatic breaths, bilateral axillary nodes, bilateral pectoral nodes, anterior chest, short neck, posterior neck moving fluid towards pathway aimed at lateral neck, then lateral and anterior neck moving fluid towards pathway aimed at lateral neck then retracing all steps while educating pt in basic principles of self MLD and anatomy and physiology of the lymphatic system. Created foam chip pack for pt to wear around head and neck to decrease edema.   PATIENT EDUCATION:  Education details: anatomy and physiology of the lymphatic system, basic principles of self MLD, wear chip pack Person educated: Patient Education method: Explanation Education comprehension: verbalized understanding  HOME EXERCISE PROGRAM: Wear chip pack as much as possible  ASSESSMENT:  CLINICAL IMPRESSION: Patient is a 71 y.o. male who was seen today for physical therapy evaluation and treatment for neck lymphedema. Pt  has a history of left neck dissection and completion of radiation for treatment of metastatic cancer of cervical lymph nodes. His ROM of his neck is limited secondary to his neck dissection and he also has limited ROM of L shoulder following the surgery. He has considerable swelling at anterior neck. Pt would benefit from skilled PT services to decrease  anterior neck swelling, improve neck ROM and assist pt with obtaining appropriate compression garments. Once pt's lymphedema is address then will assess his L shoulder and try to improve ROM of L shoulder.   OBJECTIVE IMPAIRMENTS: decreased ROM, increased edema, increased fascial restrictions, impaired UE functional use, and postural dysfunction.   ACTIVITY LIMITATIONS:  none  PARTICIPATION LIMITATIONS:  none  PERSONAL FACTORS:  none  are also affecting patient's functional outcome.   REHAB POTENTIAL: Good  CLINICAL DECISION MAKING: Stable/uncomplicated  EVALUATION COMPLEXITY: Low  GOALS: Goals reviewed with patient? Yes   LONG TERM GOALS: Target date: 10/03/2022    Pt will demonstrate a 2 cm decrease at edema 8 cm superior to sternal notch to decrease risk of infection. Baseline:  Goal status: INITIAL  2.  Pt will be independent in self MLD for long term management of edema.  Baseline:  Goal status: INITIAL  3.  Pt will obtain appropriate compression garments for long term management of lymphedema.  Baseline:  Goal status: INITIAL  4.  Pt will return to baseline cervical ROM to allow pt to return to PLOF.  Baseline:  Goal status: INITIAL   PLAN:  PT FREQUENCY: 2x/week  PT DURATION: 4 weeks  PLANNED INTERVENTIONS: Therapeutic exercises, Therapeutic activity, Patient/Family education, Self Care, Joint mobilization, Orthotic/Fit training, Manual lymph drainage, Compression bandaging, scar mobilization, Vasopneumatic device, and Manual therapy  PLAN FOR NEXT SESSION: MLD to neck, instruct pt using Norton handout, eventually assist with obtaining compression garment (solaris head and neck?), once lymphedema is controlled assess L shoulder ROM and add goals   [employer], PT 09/05/2022, 4:06 PM

## 2022-09-12 ENCOUNTER — Ambulatory Visit: Payer: Medicare Other | Admitting: Physical Therapy

## 2022-09-12 ENCOUNTER — Encounter: Payer: Self-pay | Admitting: Physical Therapy

## 2022-09-12 DIAGNOSIS — C770 Secondary and unspecified malignant neoplasm of lymph nodes of head, face and neck: Secondary | ICD-10-CM

## 2022-09-12 DIAGNOSIS — R293 Abnormal posture: Secondary | ICD-10-CM

## 2022-09-12 DIAGNOSIS — I890 Lymphedema, not elsewhere classified: Secondary | ICD-10-CM

## 2022-09-12 DIAGNOSIS — Z483 Aftercare following surgery for neoplasm: Secondary | ICD-10-CM

## 2022-09-12 NOTE — Therapy (Signed)
OUTPATIENT PHYSICAL THERAPY ONCOLOGY TREATMENT  Patient Name: Brad [name] MRN: [id number] DOB:02-Jun-1951, 71 y.o., male Today's Date: 09/12/2022   PT End of Session - 09/12/22 1106     Visit Number 2    Number of Visits 9    Date for PT Re-Evaluation 10/03/22    PT Start Time 1106    PT Stop Time 1149    PT Time Calculation (min) 43 min    Activity Tolerance Patient tolerated treatment well    Behavior During Therapy WFL for tasks assessed/performed             Past Medical History:  Diagnosis Date   Allergy    Bronchitis    chronic   Cancer ([place])    Constipation    COPD (chronic obstructive pulmonary disease) ([place])    Dyspnea    Hypercholesteremia    Hypertension    Past Surgical History:  Procedure Laterality Date   COLONOSCOPY     FRACTURE SURGERY     right wrist   LARYNGOSCOPY AND ESOPHAGOSCOPY N/A 03/03/2022   Procedure: DIRECT LARYNGOSCOPY AND ESOPHAGOSCOPY BIOPSIES AND FROZEN SECTIONS;  Surgeon: Izora Gala, MD;  Location: [place];  Service: ENT;  Laterality: N/A;   Lipo Suction     NASOPHARYNGOSCOPY N/A 03/03/2022   Procedure: NASOPHARYNGOSCOPY;  Surgeon: Izora Gala, MD;  Location: [place];  Service: ENT;  Laterality: N/A;   POLYPECTOMY     RADICAL NECK DISSECTION Left 03/03/2022   Procedure: NECK DISSECTION;  Surgeon: Izora Gala, MD;  Location: [place];  Service: ENT;  Laterality: Left;   Patient Active Problem List   Diagnosis Date Noted   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 ([place]) 03/03/2022   Secondary and unspecified malignant neoplasm of lymph nodes of head, face and neck ([place]) 01/30/2022   SOB (shortness of breath) 05/19/2012   Cough 05/19/2012   COPD (chronic obstructive pulmonary disease) ([place]) 05/19/2012   HTN (hypertension) 05/19/2012   COPD exacerbation ([place]) 05/19/2012    PCP: Lujean Amel, MD   REFERRING PROVIDER: Eppie Gibson, MD  REFERRING DIAG: C77.0 (ICD-10-CM) - Secondary and unspecified malignant neoplasm of lymph nodes  of head, face and neck ([place])  THERAPY DIAG:  Lymphedema, not elsewhere classified  Aftercare following surgery for neoplasm  Abnormal posture  Metastasis to cervical lymph node ([place])  ONSET DATE: 06/20/22  Rationale for Evaluation and Treatment Rehabilitation  SUBJECTIVE:                                                                                                                                                                                           SUBJECTIVE STATEMENT:  I feel like the chip pack is helping. When I wear it it feels smaller and more loose. If I take it off and leave it off for 2 hours it comes back.  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 He presented to his PCP on 01/05/22 with left neck lymphadenopathy. CT neck completed same day revealed an enlarged and heterogeneous left leel 2 lymph node measuring 5.0 X 2.8 cm noted as highly suspicious for nodal metastatic disease. No definite primary neoplasm was otherwise identified within the oral cavity, pharynx or larynx, or any other enlarged or suspicious lymph nodes elsewhere within the neck. 01/06/22 he saw Dr. Constance Holster for evaluation. During this visit, the patient reported first noticing the left neck mass 2 days prior to presentation, and denied any symptoms related to it. Subsequently, Dr. Constance Holster performed on laryngoscopy on this same date which revealed no abnormal findings. FNA of left neck mass was also performed during this visit which revealed findings consistent with squamous cell carcinoma. 01/25/22 PET completed revealing fairly symmetric hypermetabolic activity in the lingual tonsil region, No clear primary mass lesion was identified. PET also showed an enlarged hypermetabolic left level 2 lymph node measuring 24 mm in the short axis, though >3cm in greatest dimension to my eye, with an SUV max of 15.7. No additional enlarged or hypermetabolic cervical  lymph nodes were appreciated. No signs of distant metastatic disease.  Bilateral lingual tonsil SUV uptake. 03/03/22 Biopsies completed with Dr. Constance Holster revealing Left neck-inferior jugular vein dissection: Metastatic squamous cell carcinoma involving 3/23 lymph nodes, with 2 lymph nodes from level II and 1 lymph node from level III involved by carcinoma (p16 positive; EBV negative). No evidence of extranodal extension; jugular vein negative for carcinoma; salivary gland negative for carcinoma. -- Biopsies of the vallecula, left tongue base, left tonsil, and nasopharynx negative for carcinoma. He will receive 30 fractions of radiation to his Oropharynx and bilateral neck. He started on 04/12/22 and will complete on 05/25/22. PAIN:  Are you having pain? No  PRECAUTIONS: Other: anterior neck lymphedema  WEIGHT BEARING RESTRICTIONS: No  FALLS:  Has patient fallen in last 6 months? No  LIVING ENVIRONMENT: Lives with: lives with their spouse Lives in: House/apartment Stairs: Yes; Internal: 15 steps; on right going up Has following equipment at home: None  OCCUPATION: retired  LEISURE: gym 3 days per week minimum, warms up on stationary bike, does weight machine and free weights  HAND DOMINANCE: right   PRIOR LEVEL OF FUNCTION: Independent  PATIENT GOALS: to get rid of the swelling   OBJECTIVE:  COGNITION: Overall cognitive status: Within functional limits for tasks assessed   PALPATION: Increased fibrosis and fullness in anterior neck  OBSERVATIONS / OTHER ASSESSMENTS: fullness in anterior neck  POSTURE: forward head, rounded shoulders  SHOULDER AROM:   WFL, though L shoulder is limited in certain directions secondary to nerve damage during surgery   CERVICAL AROM:     Percent limited - 06/08/22 06/08/22 09/05/22  Flexion WFL WFL 25% limited secondary to edema  Extension 25% limited WFL 25% limited  Right lateral flexion 25% limited 25% limited WFL  Left lateral flexion 50% limited  25% limited 50% limited  Right rotation 25% limited 25% limited 25% limited  Left rotation 25% limited 25% limited 25% limited                          (  Blank rows=not tested)       LYMPHEDEMA ASSESSMENT:      Circumference in cm 06/08/22 09/05/22  4 cm superior to sternal notch around neck 39.8 41  6 cm superior to sternal notch around neck 41 43.2  8 cm superior to sternal notch around neck 43 45.5  (Blank rows=not tested)     CURRENT/PAST TREATMENTS:   Surgery type/date: L neck- inferior jugular vein dissection 3/23 nodes involved on 03/03/22   Radiation:completed 05/25/22      TODAY'S TREATMENT:  09/12/22- Instructed pt using Norton anterior approach as follows: short neck, 5 diaphragmatic breaths, bilateral axillary nodes, bilateral pectoral nodes, anterior chest, short neck, posterior neck moving fluid towards pathway aimed at lateral neck, then lateral and anterior neck moving fluid towards pathway aimed at lateral neck then retracing all steps. Pt performed first half of treatment session with therapist v/c and t/c for correct sequence, skin stretch and pressure. Therapist completed session while continuing to educated pt in correct technique.  09/05/22- Instructed pt using Norton anterior approach as follows: short neck, 5 diaphragmatic breaths, bilateral axillary nodes, bilateral pectoral nodes, anterior chest, short neck, posterior neck moving fluid towards pathway aimed at lateral neck, then lateral and anterior neck moving fluid towards pathway aimed at lateral neck then retracing all steps while educating pt in basic principles of self MLD and anatomy and physiology of the lymphatic system. Created foam chip pack for pt to wear around head and neck to decrease edema.   PATIENT EDUCATION:  Education details: anatomy and physiology of the lymphatic system, basic principles of self MLD, wear chip pack Person educated: Patient Education method: Explanation Education  comprehension: verbalized understanding  HOME EXERCISE PROGRAM: Wear chip pack as much as possible  ASSESSMENT:  CLINICAL IMPRESSION: Pt reports his chip pack has been working and his anterior neck is softer. There is marked improvement in fibrosis in this area. Showed pt a sample head and neck L&R tribute wrap head and neck garment and issued pt information to obtain this. He plans on ordering it today. Began instructing pt in self MLD and had pt return demonstrate. Provided v/c and t/c throughout. Improvement noted in swelling by end of session.   OBJECTIVE IMPAIRMENTS: decreased ROM, increased edema, increased fascial restrictions, impaired UE functional use, and postural dysfunction.   ACTIVITY LIMITATIONS:  none  PARTICIPATION LIMITATIONS:  none  PERSONAL FACTORS:  none  are also affecting patient's functional outcome.   REHAB POTENTIAL: Good  CLINICAL DECISION MAKING: Stable/uncomplicated  EVALUATION COMPLEXITY: Low  GOALS: Goals reviewed with patient? Yes   LONG TERM GOALS: Target date: 10/10/2022    Pt will demonstrate a 2 cm decrease at edema 8 cm superior to sternal notch to decrease risk of infection. Baseline:  Goal status: INITIAL  2.  Pt will be independent in self MLD for long term management of edema.  Baseline:  Goal status: INITIAL  3.  Pt will obtain appropriate compression garments for long term management of lymphedema.  Baseline:  Goal status: INITIAL  4.  Pt will return to baseline cervical ROM to allow pt to return to PLOF.  Baseline:  Goal status: INITIAL   PLAN:  PT FREQUENCY: 2x/week  PT DURATION: 4 weeks  PLANNED INTERVENTIONS: Therapeutic exercises, Therapeutic activity, Patient/Family education, Self Care, Joint mobilization, Orthotic/Fit training, Manual lymph drainage, Compression bandaging, scar mobilization, Vasopneumatic device, and Manual therapy  PLAN FOR NEXT SESSION: MLD to neck, continue to instruct pt using [place], did he  order tribute head and neck garment, once lymphedema is controlled assess L shoulder ROM and add goals   [employer], PT 09/12/2022, 12:00 PM

## 2022-09-13 ENCOUNTER — Ambulatory Visit (HOSPITAL_COMMUNITY)
Admission: RE | Admit: 2022-09-13 | Discharge: 2022-09-13 | Disposition: A | Discharge status: Home / Self Care | Payer: Medicare Other | Source: Ambulatory Visit | Attending: Family Medicine | Admitting: Family Medicine

## 2022-09-13 DIAGNOSIS — R1310 Dysphagia, unspecified: Secondary | ICD-10-CM

## 2022-09-13 DIAGNOSIS — R059 Cough, unspecified: Secondary | ICD-10-CM | POA: Insufficient documentation

## 2022-09-13 DIAGNOSIS — J449 Chronic obstructive pulmonary disease, unspecified: Secondary | ICD-10-CM | POA: Diagnosis not present

## 2022-09-13 DIAGNOSIS — I10 Essential (primary) hypertension: Secondary | ICD-10-CM | POA: Diagnosis not present

## 2022-09-13 DIAGNOSIS — Z923 Personal history of irradiation: Secondary | ICD-10-CM | POA: Insufficient documentation

## 2022-09-13 DIAGNOSIS — E7800 Pure hypercholesterolemia, unspecified: Secondary | ICD-10-CM | POA: Insufficient documentation

## 2022-09-13 DIAGNOSIS — R1313 Dysphagia, pharyngeal phase: Secondary | ICD-10-CM | POA: Diagnosis not present

## 2022-09-13 DIAGNOSIS — C770 Secondary and unspecified malignant neoplasm of lymph nodes of head, face and neck: Secondary | ICD-10-CM

## 2022-09-13 NOTE — Therapy (Signed)
Modified Barium Swallow Progress Note  Patient Details  Name: Brad [name] MRN: [id number] Date of Birth: [birth year]  Today's Date: 09/13/2022  Modified Barium Swallow completed.  Full report located under Chart Review in the Imaging Section.  Brief recommendations include the following:  Clinical Impression  Patient presents with oral, pharyngeal and cervical esophageal phases of swallow that are all [facility] as per this MBS. SLP assessed patient's swallow function with barium with thin liquid, pudding thick, 10m barium tablet. Swallow initiation was timely and only trace amount of vallecular and pyriform sinus residuals observed s/p initial swallow, with full clearance after subsequent swallows. No aspiration or significant penetration observed even with larger sips of thin liquids. SLP did not observe any UES dysfunction and during esophageal sweep, no observed barium in esophagus after swallows completed. No further skilled SLP assessment indicated.   Swallow Evaluation Recommendations       SLP Diet Recommendations: Regular solids;Thin liquid   Liquid Administration via: Straw;Cup   Medication Administration: Whole meds with liquid           Postural Changes: Seated upright at 90 degrees           JSonia Baller MA, CCC-SLP Speech Therapy

## 2022-09-14 ENCOUNTER — Encounter: Payer: Self-pay | Admitting: Physical Therapy

## 2022-09-14 ENCOUNTER — Ambulatory Visit: Payer: Medicare Other | Admitting: Physical Therapy

## 2022-09-14 DIAGNOSIS — C770 Secondary and unspecified malignant neoplasm of lymph nodes of head, face and neck: Secondary | ICD-10-CM | POA: Diagnosis not present

## 2022-09-14 DIAGNOSIS — R293 Abnormal posture: Secondary | ICD-10-CM

## 2022-09-14 DIAGNOSIS — Z483 Aftercare following surgery for neoplasm: Secondary | ICD-10-CM

## 2022-09-14 DIAGNOSIS — I890 Lymphedema, not elsewhere classified: Secondary | ICD-10-CM

## 2022-09-14 NOTE — Therapy (Signed)
OUTPATIENT PHYSICAL THERAPY ONCOLOGY TREATMENT  Patient Name: Brad [name] MRN: [id number] DOB:[birth year], 71 y.o., male Today's Date: 09/14/2022   PT End of Session - 09/14/22 1148     Visit Number 3    Number of Visits 9    Date for PT Re-Evaluation 10/03/22    PT Start Time 64    PT Stop Time 1147    PT Time Calculation (min) 45 min    Activity Tolerance Patient tolerated treatment well    Behavior During Therapy WFL for tasks assessed/performed             Past Medical History:  Diagnosis Date   Allergy    Bronchitis    chronic   Cancer ([place])    Constipation    COPD (chronic obstructive pulmonary disease) ([place])    Dyspnea    Hypercholesteremia    Hypertension    Past Surgical History:  Procedure Laterality Date   COLONOSCOPY     FRACTURE SURGERY     right wrist   LARYNGOSCOPY AND ESOPHAGOSCOPY N/A 03/03/2022   Procedure: DIRECT LARYNGOSCOPY AND ESOPHAGOSCOPY BIOPSIES AND FROZEN SECTIONS;  Surgeon: Izora Gala, MD;  Location: [place];  Service: ENT;  Laterality: N/A;   Lipo Suction     NASOPHARYNGOSCOPY N/A 03/03/2022   Procedure: NASOPHARYNGOSCOPY;  Surgeon: Izora Gala, MD;  Location: [place];  Service: ENT;  Laterality: N/A;   POLYPECTOMY     RADICAL NECK DISSECTION Left 03/03/2022   Procedure: NECK DISSECTION;  Surgeon: Izora Gala, MD;  Location: [facility] [place];  Service: ENT;  Laterality: Left;   Patient Active Problem List   Diagnosis Date Noted   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 ([place]) 03/03/2022   Secondary and unspecified malignant neoplasm of lymph nodes of head, face and neck ([place]) 01/30/2022   SOB (shortness of breath) 05/19/2012   Cough 05/19/2012   COPD (chronic obstructive pulmonary disease) ([place]) 05/19/2012   HTN (hypertension) 05/19/2012   COPD exacerbation ([place]) 05/19/2012    PCP: Lujean Amel, MD   REFERRING PROVIDER: Eppie Gibson, MD  REFERRING DIAG: C77.0 (ICD-10-CM) - Secondary and unspecified malignant neoplasm of lymph nodes  of head, face and neck ([place])  THERAPY DIAG:  Lymphedema, not elsewhere classified  Aftercare following surgery for neoplasm  Abnormal posture  Metastasis to cervical lymph node ([place])  ONSET DATE: 06/20/22  Rationale for Evaluation and Treatment Rehabilitation  SUBJECTIVE:                                                                                                                                                                                           SUBJECTIVE STATEMENT:  I ordered the garment and it will be here tomorrow.  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 He presented to his PCP on 01/05/22 with left neck lymphadenopathy. CT neck completed same day revealed an enlarged and heterogeneous left leel 2 lymph node measuring 5.0 X 2.8 cm noted as highly suspicious for nodal metastatic disease. No definite primary neoplasm was otherwise identified within the oral cavity, pharynx or larynx, or any other enlarged or suspicious lymph nodes elsewhere within the neck. 01/06/22 he saw Dr. Constance Holster for evaluation. During this visit, the patient reported first noticing the left neck mass 2 days prior to presentation, and denied any symptoms related to it. Subsequently, Dr. Constance Holster performed on laryngoscopy on this same date which revealed no abnormal findings. FNA of left neck mass was also performed during this visit which revealed findings consistent with squamous cell carcinoma. 01/25/22 PET completed revealing fairly symmetric hypermetabolic activity in the lingual tonsil region, No clear primary mass lesion was identified. PET also showed an enlarged hypermetabolic left level 2 lymph node measuring 24 mm in the short axis, though >3cm in greatest dimension to my eye, with an SUV max of 15.7. No additional enlarged or hypermetabolic cervical lymph nodes were appreciated. No signs of distant metastatic disease.  Bilateral lingual tonsil  SUV uptake. 03/03/22 Biopsies completed with Dr. Constance Holster revealing Left neck-inferior jugular vein dissection: Metastatic squamous cell carcinoma involving 3/23 lymph nodes, with 2 lymph nodes from level II and 1 lymph node from level III involved by carcinoma (p16 positive; EBV negative). No evidence of extranodal extension; jugular vein negative for carcinoma; salivary gland negative for carcinoma. -- Biopsies of the vallecula, left tongue base, left tonsil, and nasopharynx negative for carcinoma. He will receive 30 fractions of radiation to his Oropharynx and bilateral neck. He started on 04/12/22 and will complete on 05/25/22. PAIN:  Are you having pain? No  PRECAUTIONS: Other: anterior neck lymphedema  WEIGHT BEARING RESTRICTIONS: No  FALLS:  Has patient fallen in last 6 months? No  LIVING ENVIRONMENT: Lives with: lives with their spouse Lives in: House/apartment Stairs: Yes; Internal: 15 steps; on right going up Has following equipment at home: None  OCCUPATION: retired  LEISURE: gym 3 days per week minimum, warms up on stationary bike, does weight machine and free weights  HAND DOMINANCE: right   PRIOR LEVEL OF FUNCTION: Independent  PATIENT GOALS: to get rid of the swelling   OBJECTIVE:  COGNITION: Overall cognitive status: Within functional limits for tasks assessed   PALPATION: Increased fibrosis and fullness in anterior neck  OBSERVATIONS / OTHER ASSESSMENTS: fullness in anterior neck  POSTURE: forward head, rounded shoulders  SHOULDER AROM:   WFL, though L shoulder is limited in certain directions secondary to nerve damage during surgery   CERVICAL AROM:     Percent limited - 06/08/22 06/08/22 09/05/22  Flexion WFL WFL 25% limited secondary to edema  Extension 25% limited WFL 25% limited  Right lateral flexion 25% limited 25% limited WFL  Left lateral flexion 50% limited 25% limited 50% limited  Right rotation 25% limited 25% limited 25% limited  Left rotation 25%  limited 25% limited 25% limited                          (Blank rows=not tested)       LYMPHEDEMA ASSESSMENT:      Circumference in cm 06/08/22 09/05/22  4 cm superior to sternal notch around neck 39.8 41  6 cm superior to sternal notch around neck 41 43.2  8 cm superior to sternal notch around neck 43 45.5  (Blank rows=not tested)     CURRENT/PAST TREATMENTS:   Surgery type/date: L neck- inferior jugular vein dissection 3/23 nodes involved on 03/03/22   Radiation:completed 05/25/22      TODAY'S TREATMENT:  09/14/22- Had pt demonstrate entire sequence using Norton anterior approach as follows: short neck, 5 diaphragmatic breaths, bilateral axillary nodes, bilateral pectoral nodes, anterior chest, short neck, posterior neck moving fluid towards pathway aimed at lateral neck, then lateral and anterior neck moving fluid towards pathway aimed at lateral neck then retracing all steps. Pt performed first half of treatment session with therapist minimal v/c and t/c for correct sequence, skin stretch and pressure. Therapist completed session. By end of session neck had visibly reduced.  09/12/22- Instructed pt using Norton anterior approach as follows: short neck, 5 diaphragmatic breaths, bilateral axillary nodes, bilateral pectoral nodes, anterior chest, short neck, posterior neck moving fluid towards pathway aimed at lateral neck, then lateral and anterior neck moving fluid towards pathway aimed at lateral neck then retracing all steps. Pt performed first half of treatment session with therapist v/c and t/c for correct sequence, skin stretch and pressure. Therapist completed session while continuing to educated pt in correct technique.  09/05/22- Instructed pt using Norton anterior approach as follows: short neck, 5 diaphragmatic breaths, bilateral axillary nodes, bilateral pectoral nodes, anterior chest, short neck, posterior neck moving fluid towards pathway aimed at lateral neck, then lateral and  anterior neck moving fluid towards pathway aimed at lateral neck then retracing all steps while educating pt in basic principles of self MLD and anatomy and physiology of the lymphatic system. Created foam chip pack for pt to wear around head and neck to decrease edema.   PATIENT EDUCATION:  Education details: anatomy and physiology of the lymphatic system, basic principles of self MLD, wear chip pack Person educated: Patient Education method: Explanation Education comprehension: verbalized understanding  HOME EXERCISE PROGRAM: Wear chip pack as much as possible  ASSESSMENT:  CLINICAL IMPRESSION: Pt arrives to appointment with neck lymphedema visibly reduced. He has been wearing his chip pack and performing self MLD at home. His fibrosis has improved greatly as well. Pt required few verbal and tactile cues for independence today. Pt will receive his long term compression garment tomorrow.   OBJECTIVE IMPAIRMENTS: decreased ROM, increased edema, increased fascial restrictions, impaired UE functional use, and postural dysfunction.   ACTIVITY LIMITATIONS:  none  PARTICIPATION LIMITATIONS:  none  PERSONAL FACTORS:  none  are also affecting patient's functional outcome.   REHAB POTENTIAL: Good  CLINICAL DECISION MAKING: Stable/uncomplicated  EVALUATION COMPLEXITY: Low  GOALS: Goals reviewed with patient? Yes   LONG TERM GOALS: Target date: 10/12/2022    Pt will demonstrate a 2 cm decrease at edema 8 cm superior to sternal notch to decrease risk of infection. Baseline:  Goal status: INITIAL  2.  Pt will be independent in self MLD for long term management of edema.  Baseline:  Goal status: INITIAL  3.  Pt will obtain appropriate compression garments for long term management of lymphedema.  Baseline:  Goal status: INITIAL  4.  Pt will return to baseline cervical ROM to allow pt to return to PLOF.  Baseline:  Goal status: INITIAL   PLAN:  PT FREQUENCY: 2x/week  PT  DURATION: 4 weeks  PLANNED INTERVENTIONS: Therapeutic exercises, Therapeutic  activity, Patient/Family education, Self Care, Joint mobilization, Orthotic/Fit training, Manual lymph drainage, Compression bandaging, scar mobilization, Vasopneumatic device, and Manual therapy  PLAN FOR NEXT SESSION: MLD to neck, continue to instruct pt using [employer], did he order tribute head and neck garment, once lymphedema is controlled assess L shoulder ROM and add goals   [employer], PT 09/14/2022, 11:54 AM

## 2022-09-18 ENCOUNTER — Ambulatory Visit: Payer: Medicare Other | Admitting: Physical Therapy

## 2022-09-18 ENCOUNTER — Encounter: Payer: Self-pay | Admitting: Physical Therapy

## 2022-09-18 DIAGNOSIS — C770 Secondary and unspecified malignant neoplasm of lymph nodes of head, face and neck: Secondary | ICD-10-CM | POA: Diagnosis not present

## 2022-09-18 DIAGNOSIS — R293 Abnormal posture: Secondary | ICD-10-CM

## 2022-09-18 DIAGNOSIS — I890 Lymphedema, not elsewhere classified: Secondary | ICD-10-CM

## 2022-09-18 DIAGNOSIS — Z483 Aftercare following surgery for neoplasm: Secondary | ICD-10-CM

## 2022-09-18 NOTE — Therapy (Signed)
OUTPATIENT PHYSICAL THERAPY ONCOLOGY TREATMENT  Patient Name: Brad [name] MRN: [id number] DOB:[birth year], 71 y.o., male Today's Date: 09/18/2022   PT End of Session - 09/18/22 1107     Visit Number 4    Number of Visits 9    Date for PT Re-Evaluation 10/03/22    PT Start Time 1106    PT Stop Time 1150    PT Time Calculation (min) 44 min    Activity Tolerance Patient tolerated treatment well    Behavior During Therapy WFL for tasks assessed/performed             Past Medical History:  Diagnosis Date   Allergy    Bronchitis    chronic   Cancer ([place])    Constipation    COPD (chronic obstructive pulmonary disease) ([place])    Dyspnea    Hypercholesteremia    Hypertension    Past Surgical History:  Procedure Laterality Date   COLONOSCOPY     FRACTURE SURGERY     right wrist   LARYNGOSCOPY AND ESOPHAGOSCOPY N/A 03/03/2022   Procedure: DIRECT LARYNGOSCOPY AND ESOPHAGOSCOPY BIOPSIES AND FROZEN SECTIONS;  Surgeon: Izora Gala, MD;  Location: [facility];  Service: ENT;  Laterality: N/A;   Lipo Suction     NASOPHARYNGOSCOPY N/A 03/03/2022   Procedure: NASOPHARYNGOSCOPY;  Surgeon: Izora Gala, MD;  Location: [facility];  Service: ENT;  Laterality: N/A;   POLYPECTOMY     RADICAL NECK DISSECTION Left 03/03/2022   Procedure: NECK DISSECTION;  Surgeon: Izora Gala, MD;  Location: [facility];  Service: ENT;  Laterality: Left;   Patient Active Problem List   Diagnosis Date Noted   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 ([place]) 03/03/2022   Secondary and unspecified malignant neoplasm of lymph nodes of head, face and neck ([place]) 01/30/2022   SOB (shortness of breath) 05/19/2012   Cough 05/19/2012   COPD (chronic obstructive pulmonary disease) ([place]) 05/19/2012   HTN (hypertension) 05/19/2012   COPD exacerbation ([place]) 05/19/2012    PCP: Lujean Amel, MD   REFERRING PROVIDER: Eppie Gibson, MD  REFERRING DIAG: C77.0 (ICD-10-CM) - Secondary and unspecified malignant neoplasm of lymph nodes  of head, face and neck ([place])  THERAPY DIAG:  Lymphedema, not elsewhere classified  Aftercare following surgery for neoplasm  Abnormal posture  Metastasis to cervical lymph node ([place])  ONSET DATE: 06/20/22  Rationale for Evaluation and Treatment Rehabilitation  SUBJECTIVE:                                                                                                                                                                                           SUBJECTIVE STATEMENT:  I got the garment on Friday and I have been wearing it 3-4 hours since then. I can tell it is helping and it is more comfortable.  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 He presented to his PCP on 01/05/22 with left neck lymphadenopathy. CT neck completed same day revealed an enlarged and heterogeneous left leel 2 lymph node measuring 5.0 X 2.8 cm noted as highly suspicious for nodal metastatic disease. No definite primary neoplasm was otherwise identified within the oral cavity, pharynx or larynx, or any other enlarged or suspicious lymph nodes elsewhere within the neck. 01/06/22 he saw Dr. Constance Holster for evaluation. During this visit, the patient reported first noticing the left neck mass 2 days prior to presentation, and denied any symptoms related to it. Subsequently, Dr. Constance Holster performed on laryngoscopy on this same date which revealed no abnormal findings. FNA of left neck mass was also performed during this visit which revealed findings consistent with squamous cell carcinoma. 01/25/22 PET completed revealing fairly symmetric hypermetabolic activity in the lingual tonsil region, No clear primary mass lesion was identified. PET also showed an enlarged hypermetabolic left level 2 lymph node measuring 24 mm in the short axis, though >3cm in greatest dimension to my eye, with an SUV max of 15.7. No additional enlarged or hypermetabolic cervical lymph nodes were  appreciated. No signs of distant metastatic disease.  Bilateral lingual tonsil SUV uptake. 03/03/22 Biopsies completed with Dr. Constance Holster revealing Left neck-inferior jugular vein dissection: Metastatic squamous cell carcinoma involving 3/23 lymph nodes, with 2 lymph nodes from level II and 1 lymph node from level III involved by carcinoma (p16 positive; EBV negative). No evidence of extranodal extension; jugular vein negative for carcinoma; salivary gland negative for carcinoma. -- Biopsies of the vallecula, left tongue base, left tonsil, and nasopharynx negative for carcinoma. He will receive 30 fractions of radiation to his Oropharynx and bilateral neck. He started on 04/12/22 and will complete on 05/25/22. PAIN:  Are you having pain? No  PRECAUTIONS: Other: anterior neck lymphedema  WEIGHT BEARING RESTRICTIONS: No  FALLS:  Has patient fallen in last 6 months? No  LIVING ENVIRONMENT: Lives with: lives with their spouse Lives in: House/apartment Stairs: Yes; Internal: 15 steps; on right going up Has following equipment at home: None  OCCUPATION: retired  LEISURE: gym 3 days per week minimum, warms up on stationary bike, does weight machine and free weights  HAND DOMINANCE: right   PRIOR LEVEL OF FUNCTION: Independent  PATIENT GOALS: to get rid of the swelling   OBJECTIVE:  COGNITION: Overall cognitive status: Within functional limits for tasks assessed   PALPATION: Increased fibrosis and fullness in anterior neck  OBSERVATIONS / OTHER ASSESSMENTS: fullness in anterior neck  POSTURE: forward head, rounded shoulders  SHOULDER AROM:   WFL, though L shoulder is limited in certain directions secondary to nerve damage during surgery   CERVICAL AROM:     Percent limited - 06/08/22 06/08/22 09/05/22  Flexion WFL WFL 25% limited secondary to edema  Extension 25% limited WFL 25% limited  Right lateral flexion 25% limited 25% limited WFL  Left lateral flexion 50% limited 25% limited 50%  limited  Right rotation 25% limited 25% limited 25% limited  Left rotation 25% limited 25% limited 25% limited                          (Blank rows=not tested)  LYMPHEDEMA ASSESSMENT:      Circumference in cm 06/08/22 09/05/22 09/18/22  4 cm superior to sternal notch around neck 39.8 41 42  6 cm superior to sternal notch around neck 41 43.2 43.5  8 cm superior to sternal notch around neck 43 45.5 45.5  (Blank rows=not tested)     CURRENT/PAST TREATMENTS:   Surgery type/date: L neck- inferior jugular vein dissection 3/23 nodes involved on 03/03/22   Radiation:completed 05/25/22      TODAY'S TREATMENT:  09/18/22- Therapist performed sequence as follows: short neck, 5 diaphragmatic breaths, bilateral axillary nodes, bilateral pectoral nodes, anterior chest, short neck, posterior neck moving fluid towards pathway aimed at lateral neck, then lateral and anterior neck moving fluid towards pathway aimed at lateral neck then retracing all steps. Educated pt to continue with scar mobilization and to try and wear his garment overnight.   09/14/22- Had pt demonstrate entire sequence using Norton anterior approach as follows: short neck, 5 diaphragmatic breaths, bilateral axillary nodes, bilateral pectoral nodes, anterior chest, short neck, posterior neck moving fluid towards pathway aimed at lateral neck, then lateral and anterior neck moving fluid towards pathway aimed at lateral neck then retracing all steps. Pt performed first half of treatment session with therapist minimal v/c and t/c for correct sequence, skin stretch and pressure. Therapist completed session. By end of session neck had visibly reduced.  09/12/22- Instructed pt using Norton anterior approach as follows: short neck, 5 diaphragmatic breaths, bilateral axillary nodes, bilateral pectoral nodes, anterior chest, short neck, posterior neck moving fluid towards pathway aimed at lateral neck, then lateral and anterior neck moving  fluid towards pathway aimed at lateral neck then retracing all steps. Pt performed first half of treatment session with therapist v/c and t/c for correct sequence, skin stretch and pressure. Therapist completed session while continuing to educated pt in correct technique.  09/05/22- Instructed pt using Norton anterior approach as follows: short neck, 5 diaphragmatic breaths, bilateral axillary nodes, bilateral pectoral nodes, anterior chest, short neck, posterior neck moving fluid towards pathway aimed at lateral neck, then lateral and anterior neck moving fluid towards pathway aimed at lateral neck then retracing all steps while educating pt in basic principles of self MLD and anatomy and physiology of the lymphatic system. Created foam chip pack for pt to wear around head and neck to decrease edema.   PATIENT EDUCATION:  Education details: anatomy and physiology of the lymphatic system, basic principles of self MLD, wear chip pack Person educated: Patient Education method: Explanation Education comprehension: verbalized understanding  HOME EXERCISE PROGRAM: Wear chip pack as much as possible  ASSESSMENT:  CLINICAL IMPRESSION: Pt reports he received his garment on Friday and has been wearing it 3-4 hrs daily. Educated pt to try and sleep in it to see if this helps. Remeasured circumferences and pt has not reduced much since re eval but the texture is much softer and less fibrotic. Pt has been compliant with self MLD. This is his only visit this week as he will be out of town the rest of the week. Continued on MLD. Will remeasure circumferences once pt returns and when his circumferences start to decrease will switch focus to L shoulder ROM.    OBJECTIVE IMPAIRMENTS: decreased ROM, increased edema, increased fascial restrictions, impaired UE functional use, and postural dysfunction.   ACTIVITY LIMITATIONS:  none  PARTICIPATION LIMITATIONS:  none  PERSONAL FACTORS:  none  are also affecting  patient's functional outcome.   REHAB POTENTIAL: Good  CLINICAL DECISION  MAKING: Stable/uncomplicated  EVALUATION COMPLEXITY: Low  GOALS: Goals reviewed with patient? Yes   LONG TERM GOALS: Target date: 10/16/2022    Pt will demonstrate a 2 cm decrease at edema 8 cm superior to sternal notch to decrease risk of infection. Baseline:  Goal status: INITIAL  2.  Pt will be independent in self MLD for long term management of edema.  Baseline:  Goal status: INITIAL  3.  Pt will obtain appropriate compression garments for long term management of lymphedema.  Baseline:  Goal status: INITIAL  4.  Pt will return to baseline cervical ROM to allow pt to return to PLOF.  Baseline:  Goal status: INITIAL   PLAN:  PT FREQUENCY: 2x/week  PT DURATION: 4 weeks  PLANNED INTERVENTIONS: Therapeutic exercises, Therapeutic activity, Patient/Family education, Self Care, Joint mobilization, Orthotic/Fit training, Manual lymph drainage, Compression bandaging, scar mobilization, Vasopneumatic device, and Manual therapy  PLAN FOR NEXT SESSION: MLD to neck, continue to instruct pt using Norton handout, did circumferences increase after trip?, once lymphedema is controlled assess L shoulder ROM and add goals   [employer], PT 09/18/2022, 11:55 AM

## 2022-09-27 ENCOUNTER — Encounter: Payer: Medicare Other | Admitting: Physical Therapy

## 2022-09-29 ENCOUNTER — Ambulatory Visit: Payer: Medicare Other | Attending: Radiation Oncology

## 2022-09-29 DIAGNOSIS — Z483 Aftercare following surgery for neoplasm: Secondary | ICD-10-CM | POA: Diagnosis present

## 2022-09-29 DIAGNOSIS — R059 Cough, unspecified: Secondary | ICD-10-CM | POA: Insufficient documentation

## 2022-09-29 DIAGNOSIS — M25512 Pain in left shoulder: Secondary | ICD-10-CM | POA: Insufficient documentation

## 2022-09-29 DIAGNOSIS — R1310 Dysphagia, unspecified: Secondary | ICD-10-CM | POA: Diagnosis present

## 2022-09-29 DIAGNOSIS — C770 Secondary and unspecified malignant neoplasm of lymph nodes of head, face and neck: Secondary | ICD-10-CM | POA: Diagnosis present

## 2022-09-29 DIAGNOSIS — M25612 Stiffness of left shoulder, not elsewhere classified: Secondary | ICD-10-CM | POA: Insufficient documentation

## 2022-09-29 DIAGNOSIS — M6281 Muscle weakness (generalized): Secondary | ICD-10-CM | POA: Insufficient documentation

## 2022-09-29 DIAGNOSIS — R293 Abnormal posture: Secondary | ICD-10-CM | POA: Insufficient documentation

## 2022-09-29 DIAGNOSIS — I890 Lymphedema, not elsewhere classified: Secondary | ICD-10-CM | POA: Insufficient documentation

## 2022-09-29 NOTE — Therapy (Signed)
OUTPATIENT PHYSICAL THERAPY ONCOLOGY TREATMENT  Patient Name: Brad [name] MRN: [id number] DOB:[birth year], 71 y.o., male Today's Date: 09/29/2022   PT End of Session - 09/29/22 0954     Visit Number 5    Number of Visits 9    Date for PT Re-Evaluation 10/03/22    PT Start Time 1000    PT Stop Time 1048    PT Time Calculation (min) 48 min    Activity Tolerance Patient tolerated treatment well    Behavior During Therapy WFL for tasks assessed/performed             Past Medical History:  Diagnosis Date   Allergy    Bronchitis    chronic   Cancer ([place])    Constipation    COPD (chronic obstructive pulmonary disease) ([place])    Dyspnea    Hypercholesteremia    Hypertension    Past Surgical History:  Procedure Laterality Date   COLONOSCOPY     FRACTURE SURGERY     right wrist   LARYNGOSCOPY AND ESOPHAGOSCOPY N/A 03/03/2022   Procedure: DIRECT LARYNGOSCOPY AND ESOPHAGOSCOPY BIOPSIES AND FROZEN SECTIONS;  Surgeon: Izora Gala, MD;  Location: [facility];  Service: ENT;  Laterality: N/A;   Lipo Suction     NASOPHARYNGOSCOPY N/A 03/03/2022   Procedure: NASOPHARYNGOSCOPY;  Surgeon: Izora Gala, MD;  Location: [facility];  Service: ENT;  Laterality: N/A;   POLYPECTOMY     RADICAL NECK DISSECTION Left 03/03/2022   Procedure: NECK DISSECTION;  Surgeon: Izora Gala, MD;  Location: [facility];  Service: ENT;  Laterality: Left;   Patient Active Problem List   Diagnosis Date Noted   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 ([place]) 03/03/2022   Secondary and unspecified malignant neoplasm of lymph nodes of head, face and neck ([place]) 01/30/2022   SOB (shortness of breath) 05/19/2012   Cough 05/19/2012   COPD (chronic obstructive pulmonary disease) ([place]) 05/19/2012   HTN (hypertension) 05/19/2012   COPD exacerbation ([place]) 05/19/2012    PCP: Lujean Amel, MD   REFERRING PROVIDER: Eppie Gibson, MD  REFERRING DIAG: C77.0 (ICD-10-CM) - Secondary and unspecified malignant neoplasm of lymph nodes  of head, face and neck ([place])  THERAPY DIAG:  Lymphedema, not elsewhere classified  Aftercare following surgery for neoplasm  Abnormal posture  Metastasis to cervical lymph node ([place])  Dysphagia, unspecified type  Cough, unspecified type  ONSET DATE: 06/20/22  Rationale for Evaluation and Treatment Rehabilitation  SUBJECTIVE:  SUBJECTIVE STATEMENT: I tried to wear the garment at night but I couldn't wear it because it was too uncomfortable. I wear it in the day time a few hours at a time.. I am doing the MLD at home and the neck feels much looser.   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 He presented to his PCP on 01/05/22 with left neck lymphadenopathy. CT neck completed same day revealed an enlarged and heterogeneous left leel 2 lymph node measuring 5.0 X 2.8 cm noted as highly suspicious for nodal metastatic disease. No definite primary neoplasm was otherwise identified within the oral cavity, pharynx or larynx, or any other enlarged or suspicious lymph nodes elsewhere within the neck. 01/06/22 he saw Dr. Constance Holster for evaluation. During this visit, the patient reported first noticing the left neck mass 2 days prior to presentation, and denied any symptoms related to it. Subsequently, Dr. Constance Holster performed on laryngoscopy on this same date which revealed no abnormal findings. FNA of left neck mass was also performed during this visit which revealed findings consistent with squamous cell carcinoma. 01/25/22 PET completed revealing fairly symmetric hypermetabolic activity in the lingual tonsil region, No clear primary mass lesion was identified. PET also showed an enlarged hypermetabolic left level 2 lymph node measuring 24 mm in the short axis,  though >3cm in greatest dimension to my eye, with an SUV max of 15.7. No additional enlarged or hypermetabolic cervical lymph nodes were appreciated. No signs of distant metastatic disease.  Bilateral lingual tonsil SUV uptake. 03/03/22 Biopsies completed with Dr. Constance Holster revealing Left neck-inferior jugular vein dissection: Metastatic squamous cell carcinoma involving 3/23 lymph nodes, with 2 lymph nodes from level II and 1 lymph node from level III involved by carcinoma (p16 positive; EBV negative). No evidence of extranodal extension; jugular vein negative for carcinoma; salivary gland negative for carcinoma. -- Biopsies of the vallecula, left tongue base, left tonsil, and nasopharynx negative for carcinoma. He will receive 30 fractions of radiation to his Oropharynx and bilateral neck. He started on 04/12/22 and will complete on 05/25/22. PAIN:  Are you having pain? No  PRECAUTIONS: Other: anterior neck lymphedema  WEIGHT BEARING RESTRICTIONS: No  FALLS:  Has patient fallen in last 6 months? No  LIVING ENVIRONMENT: Lives with: lives with their spouse Lives in: House/apartment Stairs: Yes; Internal: 15 steps; on right going up Has following equipment at home: None  OCCUPATION: retired  LEISURE: gym 3 days per week minimum, warms up on stationary bike, does weight machine and free weights  HAND DOMINANCE: right   PRIOR LEVEL OF FUNCTION: Independent  PATIENT GOALS: to get rid of the swelling   OBJECTIVE:  COGNITION: Overall cognitive status: Within functional limits for tasks assessed   PALPATION: Increased fibrosis and fullness in anterior neck  OBSERVATIONS / OTHER ASSESSMENTS: fullness in anterior neck  POSTURE: forward head, rounded shoulders  SHOULDER AROM:   WFL, though L shoulder is limited in certain directions secondary to nerve damage during surgery   CERVICAL AROM:     Percent limited - 06/08/22 06/08/22 09/05/22  Flexion WFL WFL 25% limited secondary to edema   Extension 25% limited WFL 25% limited  Right lateral flexion 25% limited 25% limited WFL  Left lateral flexion 50% limited 25% limited 50% limited  Right rotation 25% limited 25% limited 25% limited  Left rotation 25% limited 25% limited 25% limited                          (  Blank rows=not tested)       LYMPHEDEMA ASSESSMENT:      Circumference in cm 06/08/22 09/05/22 09/18/22 09/29/2022  4 cm superior to sternal notch around neck 39.8 41 42 40.5  6 cm superior to sternal notch around neck 41 43.2 43.5 42.5  8 cm superior to sternal notch around neck 43 45.5 45.5 46  (Blank rows=not tested)     CURRENT/PAST TREATMENTS:   Surgery type/date: L neck- inferior jugular vein dissection 3/23 nodes involved on 03/03/22   Radiation:completed 05/25/22      TODAY'S TREATMENT:   09/29/2022 Pt measured with good results Therapist performed sequence as follows: short neck, 5 diaphragmatic breaths, bilateral axillary nodes, bilateral pectoral nodes, anterior chest, short neck, posterior neck moving fluid towards pathway aimed at lateral neck, then lateral and anterior neck moving fluid towards pathway aimed at lateral neck then retracing all steps. Discussed wearing chip pack at night, but pt is going to try and wear his night garment but without the straps around his head to see if it helps.  09/18/22- Therapist performed sequence as follows: short neck, 5 diaphragmatic breaths, bilateral axillary nodes, bilateral pectoral nodes, anterior chest, short neck, posterior neck moving fluid towards pathway aimed at lateral neck, then lateral and anterior neck moving fluid towards pathway aimed at lateral neck then retracing all steps. Educated pt to continue with scar mobilization and to try and wear his garment overnight.   09/14/22- Had pt demonstrate entire sequence using Norton anterior approach as follows: short neck, 5 diaphragmatic breaths, bilateral axillary nodes, bilateral pectoral nodes,  anterior chest, short neck, posterior neck moving fluid towards pathway aimed at lateral neck, then lateral and anterior neck moving fluid towards pathway aimed at lateral neck then retracing all steps. Pt performed first half of treatment session with therapist minimal v/c and t/c for correct sequence, skin stretch and pressure. Therapist completed session. By end of session neck had visibly reduced.  09/12/22- Instructed pt using Norton anterior approach as follows: short neck, 5 diaphragmatic breaths, bilateral axillary nodes, bilateral pectoral nodes, anterior chest, short neck, posterior neck moving fluid towards pathway aimed at lateral neck, then lateral and anterior neck moving fluid towards pathway aimed at lateral neck then retracing all steps. Pt performed first half of treatment session with therapist v/c and t/c for correct sequence, skin stretch and pressure. Therapist completed session while continuing to educated pt in correct technique.  09/05/22- Instructed pt using Norton anterior approach as follows: short neck, 5 diaphragmatic breaths, bilateral axillary nodes, bilateral pectoral nodes, anterior chest, short neck, posterior neck moving fluid towards pathway aimed at lateral neck, then lateral and anterior neck moving fluid towards pathway aimed at lateral neck then retracing all steps while educating pt in basic principles of self MLD and anatomy and physiology of the lymphatic system. Created foam chip pack for pt to wear around head and neck to decrease edema.   PATIENT EDUCATION:  Education details: anatomy and physiology of the lymphatic system, basic principles of self MLD, wear chip pack Person educated: Patient Education method: Explanation Education comprehension: verbalized understanding  HOME EXERCISE PROGRAM: Wear chip pack as much as possible  ASSESSMENT:  CLINICAL IMPRESSION: Pt tried to wear his garment at night but was unable to tolerate. He is wearing it at  various times in the day. His neck is very supple now. Continued MLD and pt is going to try to wear his night garment but without the straps around his head. I had  suggested he try the chip pack but he indicated that it doesn't stay on well.   OBJECTIVE IMPAIRMENTS: decreased ROM, increased edema, increased fascial restrictions, impaired UE functional use, and postural dysfunction.   ACTIVITY LIMITATIONS:  none  PARTICIPATION LIMITATIONS:  none  PERSONAL FACTORS:  none  are also affecting patient's functional outcome.   REHAB POTENTIAL: Good  CLINICAL DECISION MAKING: Stable/uncomplicated  EVALUATION COMPLEXITY: Low  GOALS: Goals reviewed with patient? Yes   LONG TERM GOALS: Target date: 10/27/2022    Pt will demonstrate a 2 cm decrease at edema 8 cm superior to sternal notch to decrease risk of infection. Baseline:  Goal status: INITIAL  2.  Pt will be independent in self MLD for long term management of edema.  Baseline:  Goal status: INITIAL  3.  Pt will obtain appropriate compression garments for long term management of lymphedema.  Baseline:  Goal status: INITIAL  4.  Pt will return to baseline cervical ROM to allow pt to return to PLOF.  Baseline:  Goal status: INITIAL   PLAN:  PT FREQUENCY: 2x/week  PT DURATION: 4 weeks  PLANNED INTERVENTIONS: Therapeutic exercises, Therapeutic activity, Patient/Family education, Self Care, Joint mobilization, Orthotic/Fit training, Manual lymph drainage, Compression bandaging, scar mobilization, Vasopneumatic device, and Manual therapy  PLAN FOR NEXT SESSION: MLD to neck, continue to instruct pt using Norton handout, did circumferences increase after trip?, once lymphedema is controlled assess L shoulder ROM and add goals   Claris Pong, PT 09/29/2022, 10:54 AM

## 2022-10-02 ENCOUNTER — Encounter: Payer: Self-pay | Admitting: Physical Therapy

## 2022-10-02 ENCOUNTER — Ambulatory Visit: Payer: Medicare Other | Admitting: Physical Therapy

## 2022-10-02 DIAGNOSIS — Z483 Aftercare following surgery for neoplasm: Secondary | ICD-10-CM

## 2022-10-02 DIAGNOSIS — I890 Lymphedema, not elsewhere classified: Secondary | ICD-10-CM | POA: Diagnosis not present

## 2022-10-02 DIAGNOSIS — R293 Abnormal posture: Secondary | ICD-10-CM

## 2022-10-02 DIAGNOSIS — C770 Secondary and unspecified malignant neoplasm of lymph nodes of head, face and neck: Secondary | ICD-10-CM

## 2022-10-02 NOTE — Therapy (Signed)
OUTPATIENT PHYSICAL THERAPY ONCOLOGY TREATMENT  Patient Name: DALESSANDRO BALDYGA MRN: [id number] DOB:[birth year], 71 y.o., male Today's Date: 10/02/2022   PT End of Session - 10/02/22 0811     Visit Number 6    Number of Visits 9    Date for PT Re-Evaluation 10/03/22    PT Start Time 0810    PT Stop Time 0854    PT Time Calculation (min) 44 min    Activity Tolerance Patient tolerated treatment well    Behavior During Therapy WFL for tasks assessed/performed             Past Medical History:  Diagnosis Date   Allergy    Bronchitis    chronic   Cancer ([place])    Constipation    COPD (chronic obstructive pulmonary disease) ([place])    Dyspnea    Hypercholesteremia    Hypertension    Past Surgical History:  Procedure Laterality Date   COLONOSCOPY     FRACTURE SURGERY     right wrist   LARYNGOSCOPY AND ESOPHAGOSCOPY N/A 03/03/2022   Procedure: DIRECT LARYNGOSCOPY AND ESOPHAGOSCOPY BIOPSIES AND FROZEN SECTIONS;  Surgeon: Izora Gala, MD;  Location: [facility];  Service: ENT;  Laterality: N/A;   Lipo Suction     NASOPHARYNGOSCOPY N/A 03/03/2022   Procedure: NASOPHARYNGOSCOPY;  Surgeon: Izora Gala, MD;  Location: [facility];  Service: ENT;  Laterality: N/A;   POLYPECTOMY     RADICAL NECK DISSECTION Left 03/03/2022   Procedure: NECK DISSECTION;  Surgeon: Izora Gala, MD;  Location: [facility];  Service: ENT;  Laterality: Left;   Patient Active Problem List   Diagnosis Date Noted   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 ([place]) 03/03/2022   Secondary and unspecified malignant neoplasm of lymph nodes of head, face and neck ([place]) 01/30/2022   SOB (shortness of breath) 05/19/2012   Cough 05/19/2012   COPD (chronic obstructive pulmonary disease) ([place]) 05/19/2012   HTN (hypertension) 05/19/2012   COPD exacerbation ([place]) 05/19/2012    PCP: Lujean Amel, MD   REFERRING PROVIDER: Eppie Gibson, MD  REFERRING DIAG: C77.0 (ICD-10-CM) - Secondary and unspecified malignant neoplasm of lymph nodes  of head, face and neck ([place])  THERAPY DIAG:  Lymphedema, not elsewhere classified  Aftercare following surgery for neoplasm  Abnormal posture  Metastasis to cervical lymph node ([place])  ONSET DATE: 06/20/22  Rationale for Evaluation and Treatment Rehabilitation  SUBJECTIVE:                                                                                                                                                                                           SUBJECTIVE STATEMENT:  I tried to wear it at night but I just could not do it. My swelling is not too bad.    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 He presented to his PCP on 01/05/22 with left neck lymphadenopathy. CT neck completed same day revealed an enlarged and heterogeneous left leel 2 lymph node measuring 5.0 X 2.8 cm noted as highly suspicious for nodal metastatic disease. No definite primary neoplasm was otherwise identified within the oral cavity, pharynx or larynx, or any other enlarged or suspicious lymph nodes elsewhere within the neck. 01/06/22 he saw Dr. Constance Holster for evaluation. During this visit, the patient reported first noticing the left neck mass 2 days prior to presentation, and denied any symptoms related to it. Subsequently, Dr. Constance Holster performed on laryngoscopy on this same date which revealed no abnormal findings. FNA of left neck mass was also performed during this visit which revealed findings consistent with squamous cell carcinoma. 01/25/22 PET completed revealing fairly symmetric hypermetabolic activity in the lingual tonsil region, No clear primary mass lesion was identified. PET also showed an enlarged hypermetabolic left level 2 lymph node measuring 24 mm in the short axis, though >3cm in greatest dimension to my eye, with an SUV max of 15.7. No additional enlarged or hypermetabolic cervical lymph nodes were appreciated. No signs of distant metastatic  disease.  Bilateral lingual tonsil SUV uptake. 03/03/22 Biopsies completed with Dr. Constance Holster revealing Left neck-inferior jugular vein dissection: Metastatic squamous cell carcinoma involving 3/23 lymph nodes, with 2 lymph nodes from level II and 1 lymph node from level III involved by carcinoma (p16 positive; EBV negative). No evidence of extranodal extension; jugular vein negative for carcinoma; salivary gland negative for carcinoma. -- Biopsies of the vallecula, left tongue base, left tonsil, and nasopharynx negative for carcinoma. He will receive 30 fractions of radiation to his Oropharynx and bilateral neck. He started on 04/12/22 and will complete on 05/25/22. PAIN:  Are you having pain? No  PRECAUTIONS: Other: anterior neck lymphedema  WEIGHT BEARING RESTRICTIONS: No  FALLS:  Has patient fallen in last 6 months? No  LIVING ENVIRONMENT: Lives with: lives with their spouse Lives in: House/apartment Stairs: Yes; Internal: 15 steps; on right going up Has following equipment at home: None  OCCUPATION: retired  LEISURE: gym 3 days per week minimum, warms up on stationary bike, does weight machine and free weights  HAND DOMINANCE: right   PRIOR LEVEL OF FUNCTION: Independent  PATIENT GOALS: to get rid of the swelling   OBJECTIVE:  COGNITION: Overall cognitive status: Within functional limits for tasks assessed   PALPATION: Increased fibrosis and fullness in anterior neck  OBSERVATIONS / OTHER ASSESSMENTS: fullness in anterior neck  POSTURE: forward head, rounded shoulders  SHOULDER AROM:   WFL, though L shoulder is limited in certain directions secondary to nerve damage during surgery   CERVICAL AROM:     Percent limited - 06/08/22 06/08/22 09/05/22  Flexion WFL WFL 25% limited secondary to edema  Extension 25% limited WFL 25% limited  Right lateral flexion 25% limited 25% limited WFL  Left lateral flexion 50% limited 25% limited 50% limited  Right rotation 25% limited 25%  limited 25% limited  Left rotation 25% limited 25% limited 25% limited                          (Blank rows=not tested)       LYMPHEDEMA  ASSESSMENT:      Circumference in cm 06/08/22 09/05/22 09/18/22 09/29/2022 10/02/22  4 cm superior to sternal notch around neck 39.8 41 42 40.5 39.9  6 cm superior to sternal notch around neck 41 43.2 43.5 42.5 41.1  8 cm superior to sternal notch around neck 43 45.5 45.5 46 43.3  (Blank rows=not tested)     CURRENT/PAST TREATMENTS:   Surgery type/date: L neck- inferior jugular vein dissection 3/23 nodes involved on 03/03/22   Radiation:completed 05/25/22      TODAY'S TREATMENT: 10/02/22 Remeasured circumferences and pt demonstrates a good decrease since last session and is now nearly at baseline. Assessed head and neck garment for fit. Therapist performed sequence as follows: short neck, 5 diaphragmatic breaths, bilateral axillary nodes, bilateral pectoral nodes, anterior chest, short neck, posterior neck moving fluid towards pathway aimed at lateral neck, then lateral and anterior neck moving fluid towards pathway aimed at lateral neck then retracing all steps.   09/29/2022 Pt measured with good results Therapist performed sequence as follows: short neck, 5 diaphragmatic breaths, bilateral axillary nodes, bilateral pectoral nodes, anterior chest, short neck, posterior neck moving fluid towards pathway aimed at lateral neck, then lateral and anterior neck moving fluid towards pathway aimed at lateral neck then retracing all steps. Discussed wearing chip pack at night, but pt is going to try and wear his night garment but without the straps around his head to see if it helps.  09/18/22- Therapist performed sequence as follows: short neck, 5 diaphragmatic breaths, bilateral axillary nodes, bilateral pectoral nodes, anterior chest, short neck, posterior neck moving fluid towards pathway aimed at lateral neck, then lateral and anterior neck moving fluid  towards pathway aimed at lateral neck then retracing all steps. Educated pt to continue with scar mobilization and to try and wear his garment overnight.   09/14/22- Had pt demonstrate entire sequence using Norton anterior approach as follows: short neck, 5 diaphragmatic breaths, bilateral axillary nodes, bilateral pectoral nodes, anterior chest, short neck, posterior neck moving fluid towards pathway aimed at lateral neck, then lateral and anterior neck moving fluid towards pathway aimed at lateral neck then retracing all steps. Pt performed first half of treatment session with therapist minimal v/c and t/c for correct sequence, skin stretch and pressure. Therapist completed session. By end of session neck had visibly reduced.  09/12/22- Instructed pt using Norton anterior approach as follows: short neck, 5 diaphragmatic breaths, bilateral axillary nodes, bilateral pectoral nodes, anterior chest, short neck, posterior neck moving fluid towards pathway aimed at lateral neck, then lateral and anterior neck moving fluid towards pathway aimed at lateral neck then retracing all steps. Pt performed first half of treatment session with therapist v/c and t/c for correct sequence, skin stretch and pressure. Therapist completed session while continuing to educated pt in correct technique.  09/05/22- Instructed pt using Norton anterior approach as follows: short neck, 5 diaphragmatic breaths, bilateral axillary nodes, bilateral pectoral nodes, anterior chest, short neck, posterior neck moving fluid towards pathway aimed at lateral neck, then lateral and anterior neck moving fluid towards pathway aimed at lateral neck then retracing all steps while educating pt in basic principles of self MLD and anatomy and physiology of the lymphatic system. Created foam chip pack for pt to wear around head and neck to decrease edema.   PATIENT EDUCATION:  Education details: anatomy and physiology of the lymphatic system, basic  principles of self MLD, wear chip pack Person educated: Patient Education method: Explanation Education comprehension: verbalized understanding  HOME EXERCISE PROGRAM: Wear chip pack as much as possible  ASSESSMENT:  CLINICAL IMPRESSION: Pt tried to wear his garment at night but was unable to tolerate. Remeasured circumferences today and he is now nearly at baseline. There is still thickness at anterior neck but it is no longer fibrotic. Increased skin wrinkles are now visible. Will assess his left shoulder next session and add appropriate goals to switch focus to this while assessing if pt is able to independently maintain his lymphedema.    OBJECTIVE IMPAIRMENTS: decreased ROM, increased edema, increased fascial restrictions, impaired UE functional use, and postural dysfunction.   ACTIVITY LIMITATIONS:  none  PARTICIPATION LIMITATIONS:  none  PERSONAL FACTORS:  none  are also affecting patient's functional outcome.   REHAB POTENTIAL: Good  CLINICAL DECISION MAKING: Stable/uncomplicated  EVALUATION COMPLEXITY: Low  GOALS: Goals reviewed with patient? Yes   LONG TERM GOALS: Target date: 10/30/2022    Pt will demonstrate a 2 cm decrease at edema 8 cm superior to sternal notch to decrease risk of infection. Baseline:  Goal status: INITIAL  2.  Pt will be independent in self MLD for long term management of edema.  Baseline:  Goal status: INITIAL  3.  Pt will obtain appropriate compression garments for long term management of lymphedema.  Baseline:  Goal status: INITIAL  4.  Pt will return to baseline cervical ROM to allow pt to return to PLOF.  Baseline:  Goal status: INITIAL   PLAN:  PT FREQUENCY: 2x/week  PT DURATION: 4 weeks  PLANNED INTERVENTIONS: Therapeutic exercises, Therapeutic activity, Patient/Family education, Self Care, Joint mobilization, Orthotic/Fit training, Manual lymph drainage, Compression bandaging, scar mobilization, Vasopneumatic device, and  Manual therapy  PLAN FOR NEXT SESSION: update POC and assess L shoulder ROM and add goals   [employer], PT 10/02/2022, 9:02 AM

## 2022-10-04 ENCOUNTER — Ambulatory Visit: Payer: Medicare Other | Admitting: Physical Therapy

## 2022-10-04 ENCOUNTER — Encounter: Payer: Self-pay | Admitting: Physical Therapy

## 2022-10-04 DIAGNOSIS — M25512 Pain in left shoulder: Secondary | ICD-10-CM

## 2022-10-04 DIAGNOSIS — I890 Lymphedema, not elsewhere classified: Secondary | ICD-10-CM

## 2022-10-04 DIAGNOSIS — C770 Secondary and unspecified malignant neoplasm of lymph nodes of head, face and neck: Secondary | ICD-10-CM

## 2022-10-04 DIAGNOSIS — M25612 Stiffness of left shoulder, not elsewhere classified: Secondary | ICD-10-CM

## 2022-10-04 DIAGNOSIS — M6281 Muscle weakness (generalized): Secondary | ICD-10-CM

## 2022-10-04 DIAGNOSIS — R293 Abnormal posture: Secondary | ICD-10-CM

## 2022-10-04 DIAGNOSIS — Z483 Aftercare following surgery for neoplasm: Secondary | ICD-10-CM

## 2022-10-04 NOTE — Therapy (Signed)
OUTPATIENT PHYSICAL THERAPY ONCOLOGY TREATMENT  Patient Name: Brad [name] MRN: [id number] DOB:1950/12/30, 71 y.o., male Today's Date: 10/04/2022   PT End of Session - 10/04/22 0804     Visit Number 7    Number of Visits 15    Date for PT Re-Evaluation 11/01/22    PT Start Time 0803    PT Stop Time 0853    PT Time Calculation (min) 50 min    Activity Tolerance Patient tolerated treatment well    Behavior During Therapy WFL for tasks assessed/performed             Past Medical History:  Diagnosis Date   Allergy    Bronchitis    chronic   Cancer ([place])    Constipation    COPD (chronic obstructive pulmonary disease) ([place])    Dyspnea    Hypercholesteremia    Hypertension    Past Surgical History:  Procedure Laterality Date   COLONOSCOPY     FRACTURE SURGERY     right wrist   LARYNGOSCOPY AND ESOPHAGOSCOPY N/A 03/03/2022   Procedure: DIRECT LARYNGOSCOPY AND ESOPHAGOSCOPY BIOPSIES AND FROZEN SECTIONS;  Surgeon: Izora Gala, MD;  Location: [place];  Service: ENT;  Laterality: N/A;   Lipo Suction     NASOPHARYNGOSCOPY N/A 03/03/2022   Procedure: NASOPHARYNGOSCOPY;  Surgeon: Izora Gala, MD;  Location: [place];  Service: ENT;  Laterality: N/A;   POLYPECTOMY     RADICAL NECK DISSECTION Left 03/03/2022   Procedure: NECK DISSECTION;  Surgeon: Izora Gala, MD;  Location: [place];  Service: ENT;  Laterality: Left;   Patient Active Problem List   Diagnosis Date Noted   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 ([place]) 03/03/2022   Secondary and unspecified malignant neoplasm of lymph nodes of head, face and neck ([place]) 01/30/2022   SOB (shortness of breath) 05/19/2012   Cough 05/19/2012   COPD (chronic obstructive pulmonary disease) ([place]) 05/19/2012   HTN (hypertension) 05/19/2012   COPD exacerbation ([place]) 05/19/2012    PCP: Lujean Amel, MD   REFERRING PROVIDER: Eppie Gibson, MD  REFERRING DIAG: C77.0 (ICD-10-CM) - Secondary and unspecified malignant neoplasm of lymph nodes  of head, face and neck ([place])  THERAPY DIAG:  Lymphedema, not elsewhere classified  Stiffness of left shoulder, not elsewhere classified  Acute pain of left shoulder  Muscle weakness (generalized)  Aftercare following surgery for neoplasm  Abnormal posture  Metastasis to cervical lymph node ([place])  ONSET DATE: 06/20/22  Rationale for Evaluation and Treatment Rehabilitation  SUBJECTIVE:  SUBJECTIVE STATEMENT: I am ready to address the shoulder. It is really hard to raise my arm and after a certain point it causes pain. I have trouble paying at the drive through.    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 He presented to his PCP on 01/05/22 with left neck lymphadenopathy. CT neck completed same day revealed an enlarged and heterogeneous left leel 2 lymph node measuring 5.0 X 2.8 cm noted as highly suspicious for nodal metastatic disease. No definite primary neoplasm was otherwise identified within the oral cavity, pharynx or larynx, or any other enlarged or suspicious lymph nodes elsewhere within the neck. 01/06/22 he saw Dr. Constance Holster for evaluation. During this visit, the patient reported first noticing the left neck mass 2 days prior to presentation, and denied any symptoms related to it. Subsequently, Dr. Constance Holster performed on laryngoscopy on this same date which revealed no abnormal findings. FNA of left neck mass was also performed during this visit which revealed findings consistent with squamous cell carcinoma. 01/25/22 PET completed revealing fairly symmetric hypermetabolic activity in the lingual tonsil region, No clear primary mass lesion was identified. PET also showed an enlarged hypermetabolic left level 2 lymph node measuring 24 mm in the  short axis, though >3cm in greatest dimension to my eye, with an SUV max of 15.7. No additional enlarged or hypermetabolic cervical lymph nodes were appreciated. No signs of distant metastatic disease.  Bilateral lingual tonsil SUV uptake. 03/03/22 Biopsies completed with Dr. Constance Holster revealing Left neck-inferior jugular vein dissection: Metastatic squamous cell carcinoma involving 3/23 lymph nodes, with 2 lymph nodes from level II and 1 lymph node from level III involved by carcinoma (p16 positive; EBV negative). No evidence of extranodal extension; jugular vein negative for carcinoma; salivary gland negative for carcinoma. -- Biopsies of the vallecula, left tongue base, left tonsil, and nasopharynx negative for carcinoma. He will receive 30 fractions of radiation to his Oropharynx and bilateral neck. He started on 04/12/22 and will complete on 05/25/22. PAIN:  Are you having pain? No  PRECAUTIONS: Other: anterior neck lymphedema  WEIGHT BEARING RESTRICTIONS: No  FALLS:  Has patient fallen in last 6 months? No  LIVING ENVIRONMENT: Lives with: lives with their spouse Lives in: House/apartment Stairs: Yes; Internal: 15 steps; on right going up Has following equipment at home: None  OCCUPATION: retired  LEISURE: gym 3 days per week minimum, warms up on stationary bike, does weight machine and free weights  HAND DOMINANCE: right   PRIOR LEVEL OF FUNCTION: Independent  PATIENT GOALS: to get rid of the swelling   OBJECTIVE:  COGNITION: Overall cognitive status: Within functional limits for tasks assessed   PALPATION: Increased fibrosis and fullness in anterior neck  OBSERVATIONS / OTHER ASSESSMENTS: fullness in anterior neck  POSTURE: forward head, rounded shoulders  SHOULDER AROM:   WFL, though L shoulder is limited in certain directions secondary to nerve damage during surgery  SCAPULAR ROM: very limited L scapular motion with winging present; increased compensation   CERVICAL  AROM:     Percent limited - 06/08/22 06/08/22 09/05/22  Flexion WFL WFL 25% limited secondary to edema  Extension 25% limited WFL 25% limited  Right lateral flexion 25% limited 25% limited WFL  Left lateral flexion 50% limited 25% limited 50% limited  Right rotation 25% limited 25% limited 25% limited  Left rotation 25% limited 25% limited 25% limited                          (  Blank rows=not tested)    UPPER EXTREMITY AROM/PROM:  A/PROM RIGHT   10/04/22   Shoulder extension 78  Shoulder flexion 147  Shoulder abduction 165  Shoulder internal rotation 65  Shoulder external rotation 86    (Blank rows = not tested)  A/PROM LEFT   10/04/22  Shoulder extension 54  Shoulder flexion 104  Shoulder abduction 65  Shoulder internal rotation 52  Shoulder external rotation 67    (Blank rows = not tested)   LYMPHEDEMA ASSESSMENT:      Circumference in cm 06/08/22 09/05/22 09/18/22 09/29/2022 10/02/22  4 cm superior to sternal notch around neck 39.8 41 42 40.5 39.9  6 cm superior to sternal notch around neck 41 43.2 43.5 42.5 41.1  8 cm superior to sternal notch around neck 43 45.5 45.5 46 43.3  (Blank rows=not tested)     CURRENT/PAST TREATMENTS:   Surgery type/date: L neck- inferior jugular vein dissection 3/23 nodes involved on 03/03/22   Radiation:completed 05/25/22      TODAY'S TREATMENT: 10/04/22 In R sidelying : ER x 10 reps with 1 lb with pt returning therapist demo; L shoulder flexion with therapist stabilizing shoulder to prevent compensation x 10; L shoulder abduction with therapist stabilizing shoulder to prevent compensation x 10 reps; scapular mobs to L scapula in all directions In supine: shoulder flexion with dowel AAROM x 10 reps with pt returning therapist demo and v/c to keep scapular muscles engaged; L shoulder abduction with dowel x 10 reps at approx 20 deg from mat to prevent pain with v/c to decrease scapular compensation; horizontal abduction with red band x 10  reps with pt returning therapist demo In sitting: scapular retraction x 10 reps with red band and pt returning therapist demo  10/02/22 Remeasured circumferences and pt demonstrates a good decrease since last session and is now nearly at baseline. Assessed head and neck garment for fit. Therapist performed sequence as follows: short neck, 5 diaphragmatic breaths, bilateral axillary nodes, bilateral pectoral nodes, anterior chest, short neck, posterior neck moving fluid towards pathway aimed at lateral neck, then lateral and anterior neck moving fluid towards pathway aimed at lateral neck then retracing all steps.   09/29/2022 Pt measured with good results Therapist performed sequence as follows: short neck, 5 diaphragmatic breaths, bilateral axillary nodes, bilateral pectoral nodes, anterior chest, short neck, posterior neck moving fluid towards pathway aimed at lateral neck, then lateral and anterior neck moving fluid towards pathway aimed at lateral neck then retracing all steps. Discussed wearing chip pack at night, but pt is going to try and wear his night garment but without the straps around his head to see if it helps.  09/18/22- Therapist performed sequence as follows: short neck, 5 diaphragmatic breaths, bilateral axillary nodes, bilateral pectoral nodes, anterior chest, short neck, posterior neck moving fluid towards pathway aimed at lateral neck, then lateral and anterior neck moving fluid towards pathway aimed at lateral neck then retracing all steps. Educated pt to continue with scar mobilization and to try and wear his garment overnight.   09/14/22- Had pt demonstrate entire sequence using Norton anterior approach as follows: short neck, 5 diaphragmatic breaths, bilateral axillary nodes, bilateral pectoral nodes, anterior chest, short neck, posterior neck moving fluid towards pathway aimed at lateral neck, then lateral and anterior neck moving fluid towards pathway aimed at lateral neck  then retracing all steps. Pt performed first half of treatment session with therapist minimal v/c and t/c for correct sequence, skin stretch and pressure. Therapist  completed session. By end of session neck had visibly reduced.  09/12/22- Instructed pt using Norton anterior approach as follows: short neck, 5 diaphragmatic breaths, bilateral axillary nodes, bilateral pectoral nodes, anterior chest, short neck, posterior neck moving fluid towards pathway aimed at lateral neck, then lateral and anterior neck moving fluid towards pathway aimed at lateral neck then retracing all steps. Pt performed first half of treatment session with therapist v/c and t/c for correct sequence, skin stretch and pressure. Therapist completed session while continuing to educated pt in correct technique.  09/05/22- Instructed pt using Norton anterior approach as follows: short neck, 5 diaphragmatic breaths, bilateral axillary nodes, bilateral pectoral nodes, anterior chest, short neck, posterior neck moving fluid towards pathway aimed at lateral neck, then lateral and anterior neck moving fluid towards pathway aimed at lateral neck then retracing all steps while educating pt in basic principles of self MLD and anatomy and physiology of the lymphatic system. Created foam chip pack for pt to wear around head and neck to decrease edema.   PATIENT EDUCATION:  Education details: anatomy and physiology of the lymphatic system, basic principles of self MLD, wear chip pack Person educated: Patient Education method: Explanation Education comprehension: verbalized understanding  HOME EXERCISE PROGRAM: Wear head and neck garment as much as possible  Access Code: [email or web address] URL: [email or web address] Date: 10/04/2022 Prepared by: Manus Gunning  Exercises - Sidelying Shoulder External Rotation with Dumbbell  - 1 x daily - 7 x weekly - 1-3 sets - 10 reps - Sidelying Shoulder Abduction Full Range of Motion  - 1 x  daily - 7 x weekly - 1-3 sets - 10 reps - Sidelying Shoulder Flexion 15 Degrees  - 1 x daily - 7 x weekly - 1-3 sets - 10 reps - Supine Shoulder Horizontal Abduction with Resistance  - 1 x daily - 7 x weekly - 1-3 sets - 10 reps - Seated Shoulder Row with Anchored Resistance  - 1 x daily - 7 x weekly - 1-3 sets - 10 reps - Supine Shoulder Abduction AAROM with Dowel  - 1 x daily - 7 x weekly - 1 sets - 10 reps - Supine Shoulder Flexion with Dowel AAROM - Palms Up  - 1 x daily - 7 x weekly - 1 sets - 10 reps  ASSESSMENT:  CLINICAL IMPRESSION: Pt has now met all of his lymphedema goals and is able to independently manage his neck lymphedema through self MLD and compression garments. His L shoulder ROM is very limited following his neck dissection. He has decreased scapular mobility and demonstrates compensation during L shoulder AROM. He would benefit from skilled PT services to improve L shoulder ROM, improve L scapular muscle strength, improve scapulohumeral rhythm and progress pt towards independence with a home exercise program for long term management.    OBJECTIVE IMPAIRMENTS: decreased ROM, decreased strength, increased edema, increased fascial restrictions, impaired UE functional use, postural dysfunction, and pain.   ACTIVITY LIMITATIONS: carrying, lifting, and reach over head  PARTICIPATION LIMITATIONS: community activity  PERSONAL FACTORS:  none  are also affecting patient's functional outcome.   REHAB POTENTIAL: Good  CLINICAL DECISION MAKING: Stable/uncomplicated  EVALUATION COMPLEXITY: Low  GOALS: Goals reviewed with patient? Yes   LONG TERM GOALS: Target date: 11/01/2022    Pt will demonstrate a 2 cm decrease at edema 8 cm superior to sternal notch to decrease risk of infection. Baseline:  Goal status: MET Went from 46 to 43.3 10/04/22  2.  Pt will be independent in  self MLD for long term management of edema.  Baseline:  Goal status: MET pt reports he is independent  with this 10/04/22  3.  Pt will obtain appropriate compression garments for long term management of lymphedema.  Baseline:  Goal status: MET 10/04/22- got tribute night head and neck  4.  Pt will return to baseline cervical ROM to allow pt to return to PLOF.  Baseline:  Goal status: MET 10/04/22- cervical flexion no longer limited by edema  5. Pt will demonstrate 135 degrees of L shoulder flexion to allow her to reach overhead.  Goal status: NEW  6. Pt will demonstrate 130 degrees of L shoulder abduction to allow her to reach out to the side.  Goal status: NEW  7. Pt will report he is able to reach out of his window at the drive through with no difficulty.  Goal status: NEW  8. Pt will be independent in a home exercise program for long term stretching and strengthening.   Goal status: NEW   PLAN:  PT FREQUENCY: 2x/week  PT DURATION: 4 weeks  PLANNED INTERVENTIONS: Therapeutic exercises, Therapeutic activity, Patient/Family education, Self Care, Joint mobilization, Orthotic/Fit training, Manual lymph drainage, Compression bandaging, scar mobilization, Vasopneumatic device, and Manual therapy  PLAN FOR NEXT SESSION: PROM to L shoulder, L scapula stabilization exercises, sidelying ER, supine abd and flex   [employer], PT 10/04/2022, 8:54 AM

## 2022-10-10 ENCOUNTER — Encounter: Payer: Self-pay | Admitting: Physical Therapy

## 2022-10-10 ENCOUNTER — Ambulatory Visit: Payer: Medicare Other | Admitting: Physical Therapy

## 2022-10-10 DIAGNOSIS — M6281 Muscle weakness (generalized): Secondary | ICD-10-CM

## 2022-10-10 DIAGNOSIS — I890 Lymphedema, not elsewhere classified: Secondary | ICD-10-CM | POA: Diagnosis not present

## 2022-10-10 DIAGNOSIS — M25512 Pain in left shoulder: Secondary | ICD-10-CM

## 2022-10-10 DIAGNOSIS — M25612 Stiffness of left shoulder, not elsewhere classified: Secondary | ICD-10-CM

## 2022-10-10 NOTE — Therapy (Signed)
OUTPATIENT PHYSICAL THERAPY ONCOLOGY TREATMENT  Patient Name: Brad [name] MRN: [id number] DOB:03-18-1951, 71 y.o., male Today's Date: 10/10/2022   PT End of Session - 10/10/22 0859     Visit Number 8    Number of Visits 15    Date for PT Re-Evaluation 11/01/22    PT Start Time 0859             Past Medical History:  Diagnosis Date   Allergy    Bronchitis    chronic   Cancer ([place])    Constipation    COPD (chronic obstructive pulmonary disease) ([place])    Dyspnea    Hypercholesteremia    Hypertension    Past Surgical History:  Procedure Laterality Date   COLONOSCOPY     FRACTURE SURGERY     right wrist   LARYNGOSCOPY AND ESOPHAGOSCOPY N/A 03/03/2022   Procedure: DIRECT LARYNGOSCOPY AND ESOPHAGOSCOPY BIOPSIES AND FROZEN SECTIONS;  Surgeon: Izora Gala, MD;  Location: [facility];  Service: ENT;  Laterality: N/A;   Lipo Suction     NASOPHARYNGOSCOPY N/A 03/03/2022   Procedure: NASOPHARYNGOSCOPY;  Surgeon: Izora Gala, MD;  Location: [place];  Service: ENT;  Laterality: N/A;   POLYPECTOMY     RADICAL NECK DISSECTION Left 03/03/2022   Procedure: NECK DISSECTION;  Surgeon: Izora Gala, MD;  Location: [place];  Service: ENT;  Laterality: Left;   Patient Active Problem List   Diagnosis Date Noted   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  ONSET DATE: 06/20/22  Rationale for Evaluation and Treatment Rehabilitation  SUBJECTIVE:                                                                                                                                                                                            SUBJECTIVE STATEMENT: I  went to the gym yesterday and went to the driving range a few times since I saw you last time.    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 He presented to his PCP on 01/05/22 with  left neck lymphadenopathy. CT neck completed same day revealed an enlarged and heterogeneous left leel 2 lymph node measuring 5.0 X 2.8 cm noted as highly suspicious for nodal metastatic disease. No definite primary neoplasm was otherwise identified within the oral cavity, pharynx or larynx, or any other enlarged or suspicious lymph nodes elsewhere within the neck. 01/06/22 he saw Dr. Constance Holster for evaluation. During this visit, the patient reported first noticing the left neck mass 2 days prior to presentation, and denied any symptoms related to it. Subsequently, Dr. Constance Holster performed on laryngoscopy on this same date which revealed no abnormal findings. FNA of left neck mass was also performed during this visit which revealed findings consistent with squamous cell carcinoma. 01/25/22 PET completed revealing fairly symmetric hypermetabolic activity in the lingual tonsil region, No clear primary mass lesion was identified. PET also showed an enlarged hypermetabolic left level 2 lymph node measuring 24 mm in the short axis, though >3cm in greatest dimension to my eye, with an SUV max of 15.7. No additional enlarged or hypermetabolic cervical lymph nodes were appreciated. No signs of distant metastatic disease.  Bilateral lingual tonsil SUV uptake. 03/03/22 Biopsies completed with Dr. Constance Holster revealing Left neck-inferior jugular vein dissection: Metastatic squamous cell carcinoma involving 3/23 lymph nodes, with 2 lymph nodes from level II and 1 lymph node from level III involved by  carcinoma (p16 positive; EBV negative). No evidence of extranodal extension; jugular vein negative for carcinoma; salivary gland negative for carcinoma. -- Biopsies of the vallecula, left tongue base, left tonsil, and nasopharynx negative for carcinoma. He will receive 30 fractions of radiation to his Oropharynx and bilateral neck. He started on 04/12/22 and will complete on 05/25/22. PAIN:  Are you having pain? No  PRECAUTIONS: Other: anterior neck lymphedema  WEIGHT BEARING RESTRICTIONS: No  FALLS:  Has patient fallen in last 6 months? No  LIVING ENVIRONMENT: Lives with: lives with their spouse Lives in: House/apartment Stairs: Yes; Internal: 15 steps; on right going up Has following equipment at home: None  OCCUPATION: retired  LEISURE: gym 3 days per week minimum, warms up on stationary bike, does weight machine and free weights  HAND DOMINANCE: right   PRIOR LEVEL OF FUNCTION: Independent  PATIENT GOALS: to get rid of the swelling   OBJECTIVE:  COGNITION: Overall cognitive status: Within functional limits for tasks assessed   PALPATION: Increased fibrosis and fullness in anterior neck  OBSERVATIONS / OTHER ASSESSMENTS: fullness in anterior neck  POSTURE: forward head, rounded shoulders  SHOULDER AROM:   WFL, though L shoulder is limited in certain directions secondary to nerve damage during surgery  SCAPULAR ROM: very limited L scapular motion with winging present; increased compensation   CERVICAL AROM:     Percent limited - 06/08/22 06/08/22 09/05/22  Flexion WFL WFL 25% limited secondary to edema  Extension 25% limited WFL 25% limited  Right lateral flexion 25% limited 25% limited WFL  Left lateral flexion 50% limited 25% limited 50% limited  Right rotation 25% limited 25% limited 25% limited  Left rotation 25% limited 25% limited 25% limited                          (Blank rows=not tested)    UPPER EXTREMITY AROM/PROM:  A/PROM RIGHT   10/04/22   Shoulder  extension 78  Shoulder flexion 147  Shoulder abduction 165  Shoulder internal rotation 65  Shoulder external rotation 86    (Blank rows = not tested)  A/PROM LEFT   10/04/22 LEFT 10/10/22  Shoulder extension 54 69  Shoulder flexion 104 114  Shoulder abduction 65 68  Shoulder internal rotation 52   Shoulder external rotation 67     (Blank rows = not tested)   LYMPHEDEMA ASSESSMENT:      Circumference in cm 06/08/22 09/05/22 09/18/22 09/29/2022 10/02/22  4 cm superior to sternal notch around neck 39.8 41 42 40.5 39.9  6 cm superior to sternal notch around neck 41 43.2 43.5 42.5 41.1  8 cm superior to sternal notch around neck 43 45.5 45.5 46 43.3  (Blank rows=not tested)     CURRENT/PAST TREATMENTS:   Surgery type/date: L neck- inferior jugular vein dissection 3/23 nodes involved on 03/03/22   Radiation:completed 05/25/22      TODAY'S TREATMENT: 10/10/22: In R sidelying : ER x 10 reps with 2 lb with pt returning therapist demo; L shoulder flexion with therapist stabilizing shoulder to prevent compensation x 10; L shoulder abduction with therapist stabilizing shoulder to prevent compensation x 10 reps - then repeated all another set of 10 reps each decreasing weight for ER to 1 lb since pt was having some discomfort with 2 lbs and therapist did not stabilize the second round and pt demonstrated improved scapular mobility and control; scapular mobs to L scapula in all directions Therapeutic Ex: On Dual [employer] using 3lbs: scapular retraction x 10 reps with pt returning therapist demo, L shoulder extension reactive isometrics x 10, L shoulder ER reactive isometrics x 10 Single arm pec stretch at doorway x 30 sec - pt returned demo Reactive isometrics with red theraband tied to doorknob: L shoulder ER x 10 reps, L shoulder extension x 10 reps  10/04/22 In R sidelying : ER x 10 reps with 1 lb with pt returning therapist demo; L shoulder flexion with therapist stabilizing  shoulder to prevent compensation x 10; L shoulder abduction with therapist stabilizing shoulder to prevent compensation x 10 reps; scapular mobs to L scapula in all directions In supine: shoulder flexion with dowel AAROM x 10 reps with pt returning therapist demo and v/c to keep scapular muscles engaged; L shoulder abduction with dowel x 10 reps at approx 20 deg from mat to prevent pain with v/c to decrease scapular compensation; horizontal abduction with red band x 10 reps with pt returning therapist demo In sitting: scapular retraction x 10 reps with red band and pt returning therapist demo  10/02/22 Remeasured circumferences and pt demonstrates a good decrease since last session and is now nearly at baseline. Assessed head and neck garment for fit. Therapist performed sequence as follows: short neck, 5 diaphragmatic breaths, bilateral axillary nodes, bilateral pectoral nodes, anterior chest, short neck, posterior neck moving fluid towards pathway aimed at lateral neck, then lateral and anterior neck moving fluid towards pathway aimed at lateral neck then retracing all steps.   09/29/2022 Pt measured with good results Therapist performed sequence as follows: short neck, 5 diaphragmatic breaths, bilateral axillary nodes, bilateral pectoral nodes, anterior chest, short neck, posterior neck moving fluid towards pathway aimed at lateral neck, then lateral and anterior neck moving fluid towards pathway aimed at lateral neck then retracing all steps. Discussed wearing chip pack at night, but pt is going to try and wear his night garment but without the straps around his head to see if it helps.  09/18/22- Therapist performed sequence as follows: short neck, 5 diaphragmatic breaths, bilateral axillary nodes, bilateral pectoral nodes, anterior chest, short neck, posterior  neck moving fluid towards pathway aimed at lateral neck, then lateral and anterior neck moving fluid towards pathway aimed at lateral neck  then retracing all steps. Educated pt to continue with scar mobilization and to try and wear his garment overnight.   09/14/22- Had pt demonstrate entire sequence using Norton anterior approach as follows: short neck, 5 diaphragmatic breaths, bilateral axillary nodes, bilateral pectoral nodes, anterior chest, short neck, posterior neck moving fluid towards pathway aimed at lateral neck, then lateral and anterior neck moving fluid towards pathway aimed at lateral neck then retracing all steps. Pt performed first half of treatment session with therapist minimal v/c and t/c for correct sequence, skin stretch and pressure. Therapist completed session. By end of session neck had visibly reduced.  09/12/22- Instructed pt using Norton anterior approach as follows: short neck, 5 diaphragmatic breaths, bilateral axillary nodes, bilateral pectoral nodes, anterior chest, short neck, posterior neck moving fluid towards pathway aimed at lateral neck, then lateral and anterior neck moving fluid towards pathway aimed at lateral neck then retracing all steps. Pt performed first half of treatment session with therapist v/c and t/c for correct sequence, skin stretch and pressure. Therapist completed session while continuing to educated pt in correct technique.  09/05/22- Instructed pt using Norton anterior approach as follows: short neck, 5 diaphragmatic breaths, bilateral axillary nodes, bilateral pectoral nodes, anterior chest, short neck, posterior neck moving fluid towards pathway aimed at lateral neck, then lateral and anterior neck moving fluid towards pathway aimed at lateral neck then retracing all steps while educating pt in basic principles of self MLD and anatomy and physiology of the lymphatic system. Created foam chip pack for pt to wear around head and neck to decrease edema.   PATIENT EDUCATION:  Education details: anatomy and physiology of the lymphatic system, basic principles of self MLD, wear chip  pack Person educated: Patient Education method: Explanation Education comprehension: verbalized understanding  HOME EXERCISE PROGRAM: Wear head and neck garment as much as possible  Access Code: [email or web address] URL: [email or web address] Date: 10/04/2022 Prepared by: Manus Gunning  Exercises - Sidelying Shoulder External Rotation with Dumbbell  - 1 x daily - 7 x weekly - 1-3 sets - 10 reps - Sidelying Shoulder Abduction Full Range of Motion  - 1 x daily - 7 x weekly - 1-3 sets - 10 reps - Sidelying Shoulder Flexion 15 Degrees  - 1 x daily - 7 x weekly - 1-3 sets - 10 reps - Supine Shoulder Horizontal Abduction with Resistance  - 1 x daily - 7 x weekly - 1-3 sets - 10 reps - Seated Shoulder Row with Anchored Resistance  - 1 x daily - 7 x weekly - 1-3 sets - 10 reps - Supine Shoulder Abduction AAROM with Dowel  - 1 x daily - 7 x weekly - 1 sets - 10 reps - Supine Shoulder Flexion with Dowel AAROM - Palms Up  - 1 x daily - 7 x weekly - 1 sets - 10 reps - Single Arm Doorway Pec Stretch at 90 Degrees Abduction  - 1-2 x daily - 7 x weekly - 1 sets - 3 reps - 30 hold - Shoulder External Rotation Reactive Isometrics  - 1 x daily - 7 x weekly - 1 sets - 10 reps - Shoulder Extension Reactive Isometrics with Elbow Extended  - 1 x daily - 7 x weekly - 1 sets - 10 reps  ASSESSMENT:  CLINICAL IMPRESSION: Remeasured L shoulder ROM and it has improved some from  last visit. Pt has been doing his home exercise program and has also been exercising at the gym. Continued with scapular mobilization and strengthening today. Pt is demonstrating improved scapular mobility. Added pec stretch, and reactive isometrics in to extension and ER to his HEP.    OBJECTIVE IMPAIRMENTS: decreased ROM, decreased strength, increased edema, increased fascial restrictions, impaired UE functional use, postural dysfunction, and pain.   ACTIVITY LIMITATIONS: carrying, lifting, and reach over  head  PARTICIPATION LIMITATIONS: community activity  PERSONAL FACTORS:  none  are also affecting patient's functional outcome.   REHAB POTENTIAL: Good  CLINICAL DECISION MAKING: Stable/uncomplicated  EVALUATION COMPLEXITY: Low  GOALS: Goals reviewed with patient? Yes   LONG TERM GOALS: Target date: 11/07/2022    Pt will demonstrate a 2 cm decrease at edema 8 cm superior to sternal notch to decrease risk of infection. Baseline:  Goal status: MET Went from 46 to 43.3 10/04/22  2.  Pt will be independent in self MLD for long term management of edema.  Baseline:  Goal status: MET pt reports he is independent with this 10/04/22  3.  Pt will obtain appropriate compression garments for long term management of lymphedema.  Baseline:  Goal status: MET 10/04/22- got tribute night head and neck  4.  Pt will return to baseline cervical ROM to allow pt to return to PLOF.  Baseline:  Goal status: MET 10/04/22- cervical flexion no longer limited by edema  5. Pt will demonstrate 135 degrees of L shoulder flexion to allow her to reach overhead.  Goal status: NEW  6. Pt will demonstrate 130 degrees of L shoulder abduction to allow her to reach out to the side.  Goal status: NEW  7. Pt will report he is able to reach out of his window at the drive through with no difficulty.  Goal status: NEW  8. Pt will be independent in a home exercise program for long term stretching and strengthening.   Goal status: NEW   PLAN:  PT FREQUENCY: 2x/week  PT DURATION: 4 weeks  PLANNED INTERVENTIONS: Therapeutic exercises, Therapeutic activity, Patient/Family education, Self Care, Joint mobilization, Orthotic/Fit training, Manual lymph drainage, Compression bandaging, scar mobilization, Vasopneumatic device, and Manual therapy  PLAN FOR NEXT SESSION: PROM to L shoulder, L scapula stabilization exercises, sidelying ER, supine abd and flex, continue reactive isometrics   [employer],  PT 10/10/2022, 9:56 AM

## 2022-10-17 ENCOUNTER — Ambulatory Visit: Payer: Medicare Other | Admitting: Physical Therapy

## 2022-10-19 ENCOUNTER — Encounter: Payer: Self-pay | Admitting: Physical Therapy

## 2022-10-19 ENCOUNTER — Ambulatory Visit: Payer: Medicare Other | Admitting: Physical Therapy

## 2022-10-19 DIAGNOSIS — M6281 Muscle weakness (generalized): Secondary | ICD-10-CM

## 2022-10-19 DIAGNOSIS — M25612 Stiffness of left shoulder, not elsewhere classified: Secondary | ICD-10-CM

## 2022-10-19 DIAGNOSIS — I890 Lymphedema, not elsewhere classified: Secondary | ICD-10-CM | POA: Diagnosis not present

## 2022-10-19 DIAGNOSIS — M25512 Pain in left shoulder: Secondary | ICD-10-CM

## 2022-10-19 NOTE — Therapy (Signed)
OUTPATIENT PHYSICAL THERAPY ONCOLOGY TREATMENT  Patient Name: Brad [name] MRN: [id number] DOB:[birth year], 71 y.o., male Today's Date: 10/19/2022   PT End of Session - 10/19/22 1006     Visit Number 9    Number of Visits 15    Date for PT Re-Evaluation 11/01/22    PT Start Time 1003    PT Stop Time 1055    PT Time Calculation (min) 52 min    Activity Tolerance Patient tolerated treatment well    Behavior During Therapy WFL for tasks assessed/performed             Past Medical History:  Diagnosis Date   Allergy    Bronchitis    chronic   Cancer ([place])    Constipation    COPD (chronic obstructive pulmonary disease) ([place])    Dyspnea    Hypercholesteremia    Hypertension    Past Surgical History:  Procedure Laterality Date   COLONOSCOPY     FRACTURE SURGERY     right wrist   LARYNGOSCOPY AND ESOPHAGOSCOPY N/A 03/03/2022   Procedure: DIRECT LARYNGOSCOPY AND ESOPHAGOSCOPY BIOPSIES AND FROZEN SECTIONS;  Surgeon: Izora Gala, MD;  Location: [place] [facility];  Service: ENT;  Laterality: N/A;   Lipo Suction     NASOPHARYNGOSCOPY N/A 03/03/2022   Procedure: NASOPHARYNGOSCOPY;  Surgeon: Izora Gala, MD;  Location: [facility];  Service: ENT;  Laterality: N/A;   POLYPECTOMY     RADICAL NECK DISSECTION Left 03/03/2022   Procedure: NECK DISSECTION;  Surgeon: Izora Gala, MD;  Location: [place];  Service: ENT;  Laterality: Left;   Patient Active Problem List   Diagnosis Date Noted   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 ([place]) 03/03/2022   Secondary and unspecified malignant neoplasm of lymph nodes of head, face and neck ([place]) 01/30/2022   SOB (shortness of breath) 05/19/2012   Cough 05/19/2012   COPD (chronic obstructive pulmonary disease) ([place]) 05/19/2012   HTN (hypertension) 05/19/2012   COPD exacerbation ([place]) 05/19/2012    PCP: Lujean Amel, MD   REFERRING PROVIDER: Eppie Gibson, MD  REFERRING DIAG: C77.0 (ICD-10-CM) - Secondary and unspecified malignant neoplasm of lymph nodes  of head, face and neck ([place])  THERAPY DIAG:  Stiffness of left shoulder, not elsewhere classified  Acute pain of left shoulder  Muscle weakness (generalized)  ONSET DATE: 06/20/22  Rationale for Evaluation and Treatment Rehabilitation  SUBJECTIVE:                                                                                                                                                                                           SUBJECTIVE STATEMENT: My shoulder is  feeling ok. I feel like my ROM is getting a little better. I have been to the driving range like 3 times. It is difficult to follow through with the swing.    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 He presented to his PCP on 01/05/22 with left neck lymphadenopathy. CT neck completed same day revealed an enlarged and heterogeneous left leel 2 lymph node measuring 5.0 X 2.8 cm noted as highly suspicious for nodal metastatic disease. No definite primary neoplasm was otherwise identified within the oral cavity, pharynx or larynx, or any other enlarged or suspicious lymph nodes elsewhere within the neck. 01/06/22 he saw Dr. Constance Holster for evaluation. During this visit, the patient reported first noticing the left neck mass 2 days prior to presentation, and denied any symptoms related to it. Subsequently, Dr. Constance Holster performed on laryngoscopy on this same date which revealed no abnormal findings. FNA of left neck mass was also performed during this visit which revealed findings consistent with squamous cell carcinoma. 01/25/22 PET completed revealing fairly symmetric hypermetabolic activity in the lingual tonsil region, No clear primary mass lesion was identified. PET also showed an enlarged hypermetabolic left level 2 lymph node measuring 24 mm in the short axis, though >3cm in greatest dimension to my eye, with an SUV max of 15.7. No additional enlarged or hypermetabolic cervical  lymph nodes were appreciated. No signs of distant metastatic disease.  Bilateral lingual tonsil SUV uptake. 03/03/22 Biopsies completed with Dr. Constance Holster revealing Left neck-inferior jugular vein dissection: Metastatic squamous cell carcinoma involving 3/23 lymph nodes, with 2 lymph nodes from level II and 1 lymph node from level III involved by carcinoma (p16 positive; EBV negative). No evidence of extranodal extension; jugular vein negative for carcinoma; salivary gland negative for carcinoma. -- Biopsies of the vallecula, left tongue base, left tonsil, and nasopharynx negative for carcinoma. He will receive 30 fractions of radiation to his Oropharynx and bilateral neck. He started on 04/12/22 and will complete on 05/25/22. PAIN:  Are you having pain? No  PRECAUTIONS: Other: anterior neck lymphedema  WEIGHT BEARING RESTRICTIONS: No  FALLS:  Has patient fallen in last 6 months? No  LIVING ENVIRONMENT: Lives with: lives with their spouse Lives in: House/apartment Stairs: Yes; Internal: 15 steps; on right going up Has following equipment at home: None  OCCUPATION: retired  LEISURE: gym 3 days per week minimum, warms up on stationary bike, does weight machine and free weights  HAND DOMINANCE: right   PRIOR LEVEL OF FUNCTION: Independent  PATIENT GOALS: to get rid of the swelling   OBJECTIVE:  COGNITION: Overall cognitive status: Within functional limits for tasks assessed   PALPATION: Increased fibrosis and fullness in anterior neck  OBSERVATIONS / OTHER ASSESSMENTS: fullness in anterior neck  POSTURE: forward head, rounded shoulders  SHOULDER AROM:   WFL, though L shoulder is limited in certain directions secondary to nerve damage during surgery  SCAPULAR ROM: very limited L scapular motion with winging present; increased compensation   CERVICAL AROM:     Percent limited - 06/08/22 06/08/22 09/05/22  Flexion WFL WFL 25% limited secondary to edema  Extension 25% limited WFL 25%  limited  Right lateral flexion 25% limited 25% limited WFL  Left lateral flexion 50% limited 25% limited 50% limited  Right rotation 25% limited 25% limited 25% limited  Left rotation 25% limited 25% limited 25% limited                          (  Blank rows=not tested)    UPPER EXTREMITY AROM/PROM:  A/PROM RIGHT   10/04/22   Shoulder extension 78  Shoulder flexion 147  Shoulder abduction 165  Shoulder internal rotation 65  Shoulder external rotation 86    (Blank rows = not tested)  A/PROM LEFT   10/04/22 LEFT 10/10/22 LEFT 10/19/22  Shoulder extension 54 69 77  Shoulder flexion 104 114 111  Shoulder abduction 65 68 78  Shoulder internal rotation 52    Shoulder external rotation 67      (Blank rows = not tested)   LYMPHEDEMA ASSESSMENT:      Circumference in cm 06/08/22 09/05/22 09/18/22 09/29/2022 10/02/22  4 cm superior to sternal notch around neck 39.8 41 42 40.5 39.9  6 cm superior to sternal notch around neck 41 43.2 43.5 42.5 41.1  8 cm superior to sternal notch around neck 43 45.5 45.5 46 43.3  (Blank rows=not tested)     CURRENT/PAST TREATMENTS:   Surgery type/date: L neck- inferior jugular vein dissection 3/23 nodes involved on 03/03/22   Radiation:completed 05/25/22      TODAY'S TREATMENT: 10/19/22: In R sidelying : ER x 10 reps with 1 lb with pt returning therapist demo; L shoulder flexion with therapist stabilizing shoulder to prevent compensation x 10; L shoulder abduction with therapist stabilizing shoulder to prevent compensation x 10 reps - then repeated all another set of 10 reps each with therapist provided less t/c for scapular stabilization; scapular mobs to L scapula in all directions Therapeutic Ex: On Dual [employer] machine using  7lbs: scapular retraction x 10 reps with pt returning therapist demo, L shoulder extension reactive isometrics x 10 with 3 lbs, L shoulder ER reactive isometrics x 10 with 3 lbs, standing shoulder extension x 3 lbs with  v/c to keep scapular muscles engaged With yellow band on doorknob instructed pt in L shoulder extension x 1 rep to demo and shoulder ER x 10 reps with pt demonstrating improved form with v/c to keep elbow at side   10/10/22: In R sidelying : ER x 10 reps with 2 lb with pt returning therapist demo; L shoulder flexion with therapist stabilizing shoulder to prevent compensation x 10; L shoulder abduction with therapist stabilizing shoulder to prevent compensation x 10 reps - then repeated all another set of 10 reps each decreasing weight for ER to 1 lb since pt was having some discomfort with 2 lbs and therapist did not stabilize the second round and pt demonstrated improved scapular mobility and control; scapular mobs to L scapula in all directions Therapeutic Ex: On Dual [employer] using 3lbs: scapular retraction x 10 reps with pt returning therapist demo, L shoulder extension reactive isometrics x 10, L shoulder ER reactive isometrics x 10 Single arm pec stretch at doorway x 30 sec - pt returned demo Reactive isometrics with red theraband tied to doorknob: L shoulder ER x 10 reps, L shoulder extension x 10 reps  10/04/22 In R sidelying : ER x 10 reps with 1 lb with pt returning therapist demo; L shoulder flexion with therapist stabilizing shoulder to prevent compensation x 10; L shoulder abduction with therapist stabilizing shoulder to prevent compensation x 10 reps; scapular mobs to L scapula in all directions In supine: shoulder flexion with dowel AAROM x 10 reps with pt returning therapist demo and v/c to keep scapular muscles engaged; L shoulder abduction with dowel x 10 reps at approx 20 deg from mat to prevent pain with v/c to  decrease scapular compensation; horizontal abduction with red band x 10 reps with pt returning therapist demo In sitting: scapular retraction x 10 reps with red band and pt returning therapist demo  10/02/22 Remeasured circumferences and pt demonstrates a good  decrease since last session and is now nearly at baseline. Assessed head and neck garment for fit. Therapist performed sequence as follows: short neck, 5 diaphragmatic breaths, bilateral axillary nodes, bilateral pectoral nodes, anterior chest, short neck, posterior neck moving fluid towards pathway aimed at lateral neck, then lateral and anterior neck moving fluid towards pathway aimed at lateral neck then retracing all steps.   09/29/2022 Pt measured with good results Therapist performed sequence as follows: short neck, 5 diaphragmatic breaths, bilateral axillary nodes, bilateral pectoral nodes, anterior chest, short neck, posterior neck moving fluid towards pathway aimed at lateral neck, then lateral and anterior neck moving fluid towards pathway aimed at lateral neck then retracing all steps. Discussed wearing chip pack at night, but pt is going to try and wear his night garment but without the straps around his head to see if it helps.  09/18/22- Therapist performed sequence as follows: short neck, 5 diaphragmatic breaths, bilateral axillary nodes, bilateral pectoral nodes, anterior chest, short neck, posterior neck moving fluid towards pathway aimed at lateral neck, then lateral and anterior neck moving fluid towards pathway aimed at lateral neck then retracing all steps. Educated pt to continue with scar mobilization and to try and wear his garment overnight.   PATIENT EDUCATION:  Education details: anatomy and physiology of the lymphatic system, basic principles of self MLD, wear chip pack Person educated: Patient Education method: Explanation Education comprehension: verbalized understanding  HOME EXERCISE PROGRAM: Wear head and neck garment as much as possible  Access Code: [email or web address] URL: [email or web address] Date: 10/04/2022 Prepared by: Manus Gunning  Exercises - Sidelying Shoulder External Rotation with Dumbbell  - 1 x daily - 7 x weekly - 1-3 sets - 10  reps - Sidelying Shoulder Abduction Full Range of Motion  - 1 x daily - 7 x weekly - 1-3 sets - 10 reps - Sidelying Shoulder Flexion 15 Degrees  - 1 x daily - 7 x weekly - 1-3 sets - 10 reps - Supine Shoulder Horizontal Abduction with Resistance  - 1 x daily - 7 x weekly - 1-3 sets - 10 reps - Seated Shoulder Row with Anchored Resistance  - 1 x daily - 7 x weekly - 1-3 sets - 10 reps - Supine Shoulder Abduction AAROM with Dowel  - 1 x daily - 7 x weekly - 1 sets - 10 reps - Supine Shoulder Flexion with Dowel AAROM - Palms Up  - 1 x daily - 7 x weekly - 1 sets - 10 reps - Single Arm Doorway Pec Stretch at 90 Degrees Abduction  - 1-2 x daily - 7 x weekly - 1 sets - 3 reps - 30 hold - Shoulder External Rotation Reactive Isometrics  - 1 x daily - 7 x weekly - 1 sets - 10 reps - Shoulder Extension Reactive Isometrics with Elbow Extended  - 1 x daily - 7 x weekly - 1 sets - 10 reps - Resisted Shoulder Extension with Yellow Theraband x 1 daily - 7x weekly - 1 set -1 10 reps -Resisted Shoulder ER with Yellow Theraband x 1 daily - 7x/wk - 1 set- 10 reps  ASSESSMENT:  CLINICAL IMPRESSION: Remeasured L shoulder ROM and it has improved since last visit especially in direction of abduction. Continued  with AROM in supine in direction of flexion and abduction with pt demonstrating improved ability to stabilize scapula throughout. Added two new strengthening exercises to his HEP program. Will add kinesiotape to L shoulder at next session to see if this helps with scapular muscle activation and positioning.    OBJECTIVE IMPAIRMENTS: decreased ROM, decreased strength, increased edema, increased fascial restrictions, impaired UE functional use, postural dysfunction, and pain.   ACTIVITY LIMITATIONS: carrying, lifting, and reach over head  PARTICIPATION LIMITATIONS: community activity  PERSONAL FACTORS:  none  are also affecting patient's functional outcome.   REHAB POTENTIAL: Good  CLINICAL DECISION  MAKING: Stable/uncomplicated  EVALUATION COMPLEXITY: Low  GOALS: Goals reviewed with patient? Yes   LONG TERM GOALS: Target date: 11/16/2022    Pt will demonstrate a 2 cm decrease at edema 8 cm superior to sternal notch to decrease risk of infection. Baseline:  Goal status: MET Went from 46 to 43.3 10/04/22  2.  Pt will be independent in self MLD for long term management of edema.  Baseline:  Goal status: MET pt reports he is independent with this 10/04/22  3.  Pt will obtain appropriate compression garments for long term management of lymphedema.  Baseline:  Goal status: MET 10/04/22- got tribute night head and neck  4.  Pt will return to baseline cervical ROM to allow pt to return to PLOF.  Baseline:  Goal status: MET 10/04/22- cervical flexion no longer limited by edema  5. Pt will demonstrate 135 degrees of L shoulder flexion to allow her to reach overhead.  Goal status: NEW  6. Pt will demonstrate 130 degrees of L shoulder abduction to allow her to reach out to the side.  Goal status: NEW  7. Pt will report he is able to reach out of his window at the drive through with no difficulty.  Goal status: NEW  8. Pt will be independent in a home exercise program for long term stretching and strengthening.   Goal status: NEW   PLAN:  PT FREQUENCY: 2x/week  PT DURATION: 4 weeks  PLANNED INTERVENTIONS: Therapeutic exercises, Therapeutic activity, Patient/Family education, Self Care, Joint mobilization, Orthotic/Fit training, Manual lymph drainage, Compression bandaging, scar mobilization, Vasopneumatic device, and Manual therapy  PLAN FOR NEXT SESSION: kinesiotape to L shoulder, PROM to L shoulder, L scapula stabilization exercises, sidelying ER, supine abd and flex, continue reactive isometrics   [employer], PT 10/19/2022, 11:43 AM

## 2022-10-23 ENCOUNTER — Ambulatory Visit: Payer: Medicare Other | Attending: Radiation Oncology | Admitting: Physical Therapy

## 2022-10-23 ENCOUNTER — Encounter: Payer: Self-pay | Admitting: Physical Therapy

## 2022-10-23 DIAGNOSIS — C770 Secondary and unspecified malignant neoplasm of lymph nodes of head, face and neck: Secondary | ICD-10-CM | POA: Insufficient documentation

## 2022-10-23 DIAGNOSIS — Z483 Aftercare following surgery for neoplasm: Secondary | ICD-10-CM | POA: Insufficient documentation

## 2022-10-23 DIAGNOSIS — I890 Lymphedema, not elsewhere classified: Secondary | ICD-10-CM | POA: Insufficient documentation

## 2022-10-23 DIAGNOSIS — R293 Abnormal posture: Secondary | ICD-10-CM | POA: Diagnosis present

## 2022-10-23 DIAGNOSIS — M25612 Stiffness of left shoulder, not elsewhere classified: Secondary | ICD-10-CM | POA: Diagnosis present

## 2022-10-23 DIAGNOSIS — M6281 Muscle weakness (generalized): Secondary | ICD-10-CM | POA: Diagnosis present

## 2022-10-23 DIAGNOSIS — M25512 Pain in left shoulder: Secondary | ICD-10-CM | POA: Insufficient documentation

## 2022-10-23 NOTE — Therapy (Signed)
OUTPATIENT PHYSICAL THERAPY ONCOLOGY TREATMENT  Patient Name: Brad [name] MRN: [id number] DOB:October 23, 1951, 71 y.o., male Today's Date: 10/23/2022   PT End of Session - 10/23/22 1152     Visit Number 10    Number of Visits 15    Date for PT Re-Evaluation 11/01/22    PT Start Time 1101    PT Stop Time 1148    PT Time Calculation (min) 47 min    Activity Tolerance Patient tolerated treatment well    Behavior During Therapy WFL for tasks assessed/performed              Past Medical History:  Diagnosis Date   Allergy    Bronchitis    chronic   Cancer ([place])    Constipation    COPD (chronic obstructive pulmonary disease) ([place])    Dyspnea    Hypercholesteremia    Hypertension    Past Surgical History:  Procedure Laterality Date   COLONOSCOPY     FRACTURE SURGERY     right wrist   LARYNGOSCOPY AND ESOPHAGOSCOPY N/A 03/03/2022   Procedure: DIRECT LARYNGOSCOPY AND ESOPHAGOSCOPY BIOPSIES AND FROZEN SECTIONS;  Surgeon: Izora Gala, MD;  Location: [facility];  Service: ENT;  Laterality: N/A;   Lipo Suction     NASOPHARYNGOSCOPY N/A 03/03/2022   Procedure: NASOPHARYNGOSCOPY;  Surgeon: Izora Gala, MD;  Location: [facility];  Service: ENT;  Laterality: N/A;   POLYPECTOMY     RADICAL NECK DISSECTION Left 03/03/2022   Procedure: NECK DISSECTION;  Surgeon: Izora Gala, MD;  Location: [facility];  Service: ENT;  Laterality: Left;   Patient Active Problem List   Diagnosis Date Noted   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 ([place]) 03/03/2022   Secondary and unspecified malignant neoplasm of lymph nodes of head, face and neck ([place]) 01/30/2022   SOB (shortness of breath) 05/19/2012   Cough 05/19/2012   COPD (chronic obstructive pulmonary disease) ([place]) 05/19/2012   HTN (hypertension) 05/19/2012   COPD exacerbation ([place]) 05/19/2012    PCP: Lujean Amel, MD   REFERRING PROVIDER: Eppie Gibson, MD  REFERRING DIAG: C77.0 (ICD-10-CM) - Secondary and unspecified malignant neoplasm of lymph  nodes of head, face and neck ([place])  THERAPY DIAG:  Stiffness of left shoulder, not elsewhere classified  Acute pain of left shoulder  Muscle weakness (generalized)  Lymphedema, not elsewhere classified  Aftercare following surgery for neoplasm  Abnormal posture  ONSET DATE: 06/20/22  Rationale for Evaluation and Treatment Rehabilitation  SUBJECTIVE:  SUBJECTIVE STATEMENT: I stopped after 9 holes of golf this weekend. That was the first time I had really played since all of this happened. I was compensating for my shoulder and my back started hurting. It was the lower back and now it is fine.    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 He presented to his PCP on 01/05/22 with left neck lymphadenopathy. CT neck completed same day revealed an enlarged and heterogeneous left leel 2 lymph node measuring 5.0 X 2.8 cm noted as highly suspicious for nodal metastatic disease. No definite primary neoplasm was otherwise identified within the oral cavity, pharynx or larynx, or any other enlarged or suspicious lymph nodes elsewhere within the neck. 01/06/22 he saw Dr. Constance Holster for evaluation. During this visit, the patient reported first noticing the left neck mass 2 days prior to presentation, and denied any symptoms related to it. Subsequently, Dr. Constance Holster performed on laryngoscopy on this same date which revealed no abnormal findings. FNA of left neck mass was also performed during this visit which revealed findings consistent with squamous cell carcinoma. 01/25/22 PET completed revealing fairly symmetric hypermetabolic activity in the lingual tonsil region, No clear primary mass lesion was identified. PET also showed an enlarged hypermetabolic left level 2 lymph  node measuring 24 mm in the short axis, though >3cm in greatest dimension to my eye, with an SUV max of 15.7. No additional enlarged or hypermetabolic cervical lymph nodes were appreciated. No signs of distant metastatic disease.  Bilateral lingual tonsil SUV uptake. 03/03/22 Biopsies completed with Dr. Constance Holster revealing Left neck-inferior jugular vein dissection: Metastatic squamous cell carcinoma involving 3/23 lymph nodes, with 2 lymph nodes from level II and 1 lymph node from level III involved by carcinoma (p16 positive; EBV negative). No evidence of extranodal extension; jugular vein negative for carcinoma; salivary gland negative for carcinoma. -- Biopsies of the vallecula, left tongue base, left tonsil, and nasopharynx negative for carcinoma. He will receive 30 fractions of radiation to his Oropharynx and bilateral neck. He started on 04/12/22 and will complete on 05/25/22. PAIN:  Are you having pain? No  PRECAUTIONS: Other: anterior neck lymphedema  WEIGHT BEARING RESTRICTIONS: No  FALLS:  Has patient fallen in last 6 months? No  LIVING ENVIRONMENT: Lives with: lives with their spouse Lives in: House/apartment Stairs: Yes; Internal: 15 steps; on right going up Has following equipment at home: None  OCCUPATION: retired  LEISURE: gym 3 days per week minimum, warms up on stationary bike, does weight machine and free weights  HAND DOMINANCE: right   PRIOR LEVEL OF FUNCTION: Independent  PATIENT GOALS: to get rid of the swelling   OBJECTIVE:  COGNITION: Overall cognitive status: Within functional limits for tasks assessed   PALPATION: Increased fibrosis and fullness in anterior neck  OBSERVATIONS / OTHER ASSESSMENTS: fullness in anterior neck  POSTURE: forward head, rounded shoulders  SHOULDER AROM:   WFL, though L shoulder is limited in certain directions secondary to nerve damage during surgery  SCAPULAR ROM: very limited L scapular motion with winging present; increased  compensation   CERVICAL AROM:     Percent limited - 06/08/22 06/08/22 09/05/22  Flexion WFL WFL 25% limited secondary to edema  Extension 25% limited WFL 25% limited  Right lateral flexion 25% limited 25% limited WFL  Left lateral flexion 50% limited 25% limited 50% limited  Right rotation 25% limited 25% limited 25% limited  Left rotation 25% limited  25% limited 25% limited                          (Blank rows=not tested)    UPPER EXTREMITY AROM/PROM:  A/PROM RIGHT   10/04/22   Shoulder extension 78  Shoulder flexion 147  Shoulder abduction 165  Shoulder internal rotation 65  Shoulder external rotation 86    (Blank rows = not tested)  A/PROM LEFT   10/04/22 LEFT 10/10/22 LEFT 10/19/22 LEFT 10/23/22  Shoulder extension 54 69 77 68  Shoulder flexion 104 114 111 111  Shoulder abduction 65 68 78 72  Shoulder internal rotation 52     Shoulder external rotation 67       (Blank rows = not tested)   LYMPHEDEMA ASSESSMENT:      Circumference in cm 06/08/22 09/05/22 09/18/22 09/29/2022 10/02/22  4 cm superior to sternal notch around neck 39.8 41 42 40.5 39.9  6 cm superior to sternal notch around neck 41 43.2 43.5 42.5 41.1  8 cm superior to sternal notch around neck 43 45.5 45.5 46 43.3  (Blank rows=not tested)     CURRENT/PAST TREATMENTS:   Surgery type/date: L neck- inferior jugular vein dissection 3/23 nodes involved on 03/03/22   Radiation:completed 05/25/22      TODAY'S TREATMENT: 10/23/22: In R sidelying : ER x 10 reps with 1 lb with pt returning therapist demo; L shoulder flexion with therapist stabilizing shoulder to prevent compensation x 10; L shoulder abduction with therapist stabilizing shoulder to prevent compensation x 10 reps - then repeated all another set of 10 reps each with therapist provided less t/c for scapular stabilization; scapular mobs to L scapula in all directions In supine: scapular protraction x 10 reps Supine scapular strengthening series:  narrow and wide grip x 10 reps each, shoulder ER, shoulder diagonals, shoulder horizontal abduction with pt returning therapist demo with yellow band  Kinesiotaping to L shoulder with anchor at lower mid back with stretch at 25 percent stretching to [facility] joint then another strip going from same starting point with 50% stretch and slight overlap of original strip going to [facility] joint Therapeutic Ex: On Dual [employer] machine using  7lbs: scapular retraction x 10 reps with pt returning therapist demo, L shoulder extension reactive isometrics x 10 with 3 lbs, L shoulder flexion reactive isometrics x 10 with 3 lbs, L shoulder ER reactive isometrics x 10 with 3 lbs, standing shoulder extension x 3 lbs with v/c to keep scapular muscles engaged, standing shoulder ER x 3 lbs x 10 reps With yellow band on doorknob instructed pt in L shoulder extension x 1 rep to demo and shoulder ER x 10 reps with pt demonstrating improved form with v/c to keep elbow at side  10/19/22: In R sidelying : ER x 10 reps with 1 lb with pt returning therapist demo; L shoulder flexion with therapist stabilizing shoulder to prevent compensation x 10; L shoulder abduction with therapist stabilizing shoulder to prevent compensation x 10 reps - then repeated all another set of 10 reps each with therapist provided less t/c for scapular stabilization; scapular mobs to L scapula in all directions Therapeutic Ex: On Dual [employer] using  7lbs: scapular retraction x 10 reps with pt returning therapist demo, L shoulder extension reactive isometrics x 10 with 3 lbs, L shoulder ER reactive isometrics x 10 with 3 lbs, standing shoulder extension x 3 lbs with v/c to keep scapular muscles engaged With  yellow band on doorknob instructed pt in L shoulder extension x 1 rep to demo and shoulder ER x 10 reps with pt demonstrating improved form with v/c to keep elbow at side   10/10/22: In R sidelying : ER x 10 reps with 2 lb with pt returning  therapist demo; L shoulder flexion with therapist stabilizing shoulder to prevent compensation x 10; L shoulder abduction with therapist stabilizing shoulder to prevent compensation x 10 reps - then repeated all another set of 10 reps each decreasing weight for ER to 1 lb since pt was having some discomfort with 2 lbs and therapist did not stabilize the second round and pt demonstrated improved scapular mobility and control; scapular mobs to L scapula in all directions Therapeutic Ex: On Dual [employer] using 3lbs: scapular retraction x 10 reps with pt returning therapist demo, L shoulder extension reactive isometrics x 10, L shoulder ER reactive isometrics x 10 Single arm pec stretch at doorway x 30 sec - pt returned demo Reactive isometrics with red theraband tied to doorknob: L shoulder ER x 10 reps, L shoulder extension x 10 reps  10/04/22 In R sidelying : ER x 10 reps with 1 lb with pt returning therapist demo; L shoulder flexion with therapist stabilizing shoulder to prevent compensation x 10; L shoulder abduction with therapist stabilizing shoulder to prevent compensation x 10 reps; scapular mobs to L scapula in all directions In supine: shoulder flexion with dowel AAROM x 10 reps with pt returning therapist demo and v/c to keep scapular muscles engaged; L shoulder abduction with dowel x 10 reps at approx 20 deg from mat to prevent pain with v/c to decrease scapular compensation; horizontal abduction with red band x 10 reps with pt returning therapist demo In sitting: scapular retraction x 10 reps with red band and pt returning therapist demo  10/02/22 Remeasured circumferences and pt demonstrates a good decrease since last session and is now nearly at baseline. Assessed head and neck garment for fit. Therapist performed sequence as follows: short neck, 5 diaphragmatic breaths, bilateral axillary nodes, bilateral pectoral nodes, anterior chest, short neck, posterior neck moving fluid  towards pathway aimed at lateral neck, then lateral and anterior neck moving fluid towards pathway aimed at lateral neck then retracing all steps.   09/29/2022 Pt measured with good results Therapist performed sequence as follows: short neck, 5 diaphragmatic breaths, bilateral axillary nodes, bilateral pectoral nodes, anterior chest, short neck, posterior neck moving fluid towards pathway aimed at lateral neck, then lateral and anterior neck moving fluid towards pathway aimed at lateral neck then retracing all steps. Discussed wearing chip pack at night, but pt is going to try and wear his night garment but without the straps around his head to see if it helps.  09/18/22- Therapist performed sequence as follows: short neck, 5 diaphragmatic breaths, bilateral axillary nodes, bilateral pectoral nodes, anterior chest, short neck, posterior neck moving fluid towards pathway aimed at lateral neck, then lateral and anterior neck moving fluid towards pathway aimed at lateral neck then retracing all steps. Educated pt to continue with scar mobilization and to try and wear his garment overnight.   PATIENT EDUCATION:  Education details: anatomy and physiology of the lymphatic system, basic principles of self MLD, wear chip pack Person educated: Patient Education method: Explanation Education comprehension: verbalized understanding  HOME EXERCISE PROGRAM: Wear head and neck garment as much as possible  Access Code: [email or web address] URL: [email or web address] Date: 10/04/2022 Prepared by: Manus Gunning  Exercises -  Sidelying Shoulder External Rotation with Dumbbell  - 1 x daily - 7 x weekly - 1-3 sets - 10 reps - Sidelying Shoulder Abduction Full Range of Motion  - 1 x daily - 7 x weekly - 1-3 sets - 10 reps - Sidelying Shoulder Flexion 15 Degrees  - 1 x daily - 7 x weekly - 1-3 sets - 10 reps - Supine Shoulder Horizontal Abduction with Resistance  - 1 x daily - 7 x weekly - 1-3 sets - 10  reps - Seated Shoulder Row with Anchored Resistance  - 1 x daily - 7 x weekly - 1-3 sets - 10 reps - Supine Shoulder Abduction AAROM with Dowel  - 1 x daily - 7 x weekly - 1 sets - 10 reps - Supine Shoulder Flexion with Dowel AAROM - Palms Up  - 1 x daily - 7 x weekly - 1 sets - 10 reps - Single Arm Doorway Pec Stretch at 90 Degrees Abduction  - 1-2 x daily - 7 x weekly - 1 sets - 3 reps - 30 hold - Shoulder External Rotation Reactive Isometrics  - 1 x daily - 7 x weekly - 1 sets - 10 reps - Shoulder Extension Reactive Isometrics with Elbow Extended  - 1 x daily - 7 x weekly - 1 sets - 10 reps - Resisted Shoulder Extension with Yellow Theraband x 1 daily - 7x weekly - 1 set -1 10 reps -Resisted Shoulder ER with Yellow Theraband x 1 daily - 7x/wk - 1 set- 10 reps  ASSESSMENT:  CLINICAL IMPRESSION: Remeasured L shoulder ROM and it is similar to last session. Pt reports he was out of town and forgot his theraband and exercises so he has not done his HEP since last week. Began kinesiotaping for shoulder stabilization today and advised pt to remove if he develops any skin irritation or if it begins to peel of. Instructed pt in supine scapular strengthening series and pt reports he did not have any pain with this so added these to his HEP. Will assess how taping did at next session.    OBJECTIVE IMPAIRMENTS: decreased ROM, decreased strength, increased edema, increased fascial restrictions, impaired UE functional use, postural dysfunction, and pain.   ACTIVITY LIMITATIONS: carrying, lifting, and reach over head  PARTICIPATION LIMITATIONS: community activity  PERSONAL FACTORS:  none  are also affecting patient's functional outcome.   REHAB POTENTIAL: Good  CLINICAL DECISION MAKING: Stable/uncomplicated  EVALUATION COMPLEXITY: Low  GOALS: Goals reviewed with patient? Yes   LONG TERM GOALS: Target date: 11/20/2022    Pt will demonstrate a 2 cm decrease at edema 8 cm superior to sternal notch  to decrease risk of infection. Baseline:  Goal status: MET Went from 46 to 43.3 10/04/22  2.  Pt will be independent in self MLD for long term management of edema.  Baseline:  Goal status: MET pt reports he is independent with this 10/04/22  3.  Pt will obtain appropriate compression garments for long term management of lymphedema.  Baseline:  Goal status: MET 10/04/22- got tribute night head and neck  4.  Pt will return to baseline cervical ROM to allow pt to return to PLOF.  Baseline:  Goal status: MET 10/04/22- cervical flexion no longer limited by edema  5. Pt will demonstrate 135 degrees of L shoulder flexion to allow her to reach overhead.  Goal status: NEW  6. Pt will demonstrate 130 degrees of L shoulder abduction to allow her to reach out to  the side.  Goal status: NEW  7. Pt will report he is able to reach out of his window at the drive through with no difficulty.  Goal status: NEW  8. Pt will be independent in a home exercise program for long term stretching and strengthening.   Goal status: NEW   PLAN:  PT FREQUENCY: 2x/week  PT DURATION: 4 weeks  PLANNED INTERVENTIONS: Therapeutic exercises, Therapeutic activity, Patient/Family education, Self Care, Joint mobilization, Orthotic/Fit training, Manual lymph drainage, Compression bandaging, scar mobilization, Vasopneumatic device, and Manual therapy  PLAN FOR NEXT SESSION: how was kinesiotape to L shoulder, how are new supine scap exericsies? PROM to L shoulder, L scapula stabilization exercises, sidelying ER, supine abd and flex, continue reactive isometrics   [employer], PT 10/23/2022, 11:55 AM

## 2022-10-23 NOTE — Patient Instructions (Addendum)
Over Head Pull: Narrow and Wide Grip   Cancer Rehab [phone]   On back, knees bent, feet flat, band across thighs, elbows straight but relaxed. Pull hands apart (start). Keeping elbows straight, bring arms up and over head, hands toward floor. Keep pull steady on band. Hold momentarily. Return slowly, keeping pull steady, back to start. Then do same with a wider grip on the band (past shoulder width) Repeat _10__ times. Band color __yellow____   Side Pull: Double Arm   On back, knees bent, feet flat. Arms perpendicular to body, shoulder level, elbows straight but relaxed. Pull arms out to sides, elbows straight. Resistance band comes across collarbones, hands toward floor. Hold momentarily. Slowly return to starting position. Repeat _10__ times. Band color _yellow____   Sword   On back, knees bent, feet flat, left hand on left hip, right hand above left. Pull right arm DIAGONALLY (hip to shoulder) across chest. Bring right arm along head toward floor. Hold momentarily. Slowly return to starting position. Thumb is pointed down when by opposite hip and rotates backwards to face the floor when by head.  Repeat _10__ times. Do with left arm. Band color _yellow_____   Shoulder Rotation: Double Arm   On back, knees bent, feet flat, elbows tucked at sides, bent 90, hands palms up. Pull hands apart and down toward floor, keeping elbows near sides. Hold momentarily. Slowly return to starting position. Repeat _10__ times. Band color __yellow____

## 2022-10-25 ENCOUNTER — Ambulatory Visit: Payer: Medicare Other | Admitting: Physical Therapy

## 2022-10-25 ENCOUNTER — Encounter: Payer: Self-pay | Admitting: Physical Therapy

## 2022-10-25 DIAGNOSIS — Z483 Aftercare following surgery for neoplasm: Secondary | ICD-10-CM

## 2022-10-25 DIAGNOSIS — M25612 Stiffness of left shoulder, not elsewhere classified: Secondary | ICD-10-CM | POA: Diagnosis not present

## 2022-10-25 DIAGNOSIS — M25512 Pain in left shoulder: Secondary | ICD-10-CM

## 2022-10-25 DIAGNOSIS — M6281 Muscle weakness (generalized): Secondary | ICD-10-CM

## 2022-10-25 DIAGNOSIS — R293 Abnormal posture: Secondary | ICD-10-CM

## 2022-10-25 DIAGNOSIS — C770 Secondary and unspecified malignant neoplasm of lymph nodes of head, face and neck: Secondary | ICD-10-CM

## 2022-10-25 DIAGNOSIS — I890 Lymphedema, not elsewhere classified: Secondary | ICD-10-CM

## 2022-10-25 NOTE — Therapy (Signed)
OUTPATIENT PHYSICAL THERAPY ONCOLOGY TREATMENT  Patient Name: Brad [name] MRN: [id number] DOB:[birth year], 71 y.o., male Today's Date: 10/25/2022   PT End of Session - 10/25/22 1010     Visit Number 11    Number of Visits 15    Date for PT Re-Evaluation 11/01/22    PT Start Time 1010    PT Stop Time 1053    PT Time Calculation (min) 43 min    Activity Tolerance Patient tolerated treatment well    Behavior During Therapy WFL for tasks assessed/performed              Past Medical History:  Diagnosis Date   Allergy    Bronchitis    chronic   Cancer ([place])    Constipation    COPD (chronic obstructive pulmonary disease) ([place])    Dyspnea    Hypercholesteremia    Hypertension    Past Surgical History:  Procedure Laterality Date   COLONOSCOPY     FRACTURE SURGERY     right wrist   LARYNGOSCOPY AND ESOPHAGOSCOPY N/A 03/03/2022   Procedure: DIRECT LARYNGOSCOPY AND ESOPHAGOSCOPY BIOPSIES AND FROZEN SECTIONS;  Surgeon: Izora Gala, MD;  Location: [place] [facility];  Service: ENT;  Laterality: N/A;   Lipo Suction     NASOPHARYNGOSCOPY N/A 03/03/2022   Procedure: NASOPHARYNGOSCOPY;  Surgeon: Izora Gala, MD;  Location: [facility];  Service: ENT;  Laterality: N/A;   POLYPECTOMY     RADICAL NECK DISSECTION Left 03/03/2022   Procedure: NECK DISSECTION;  Surgeon: Izora Gala, MD;  Location: [facility];  Service: ENT;  Laterality: Left;   Patient Active Problem List   Diagnosis Date Noted   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 ([place]) 03/03/2022   Secondary and unspecified malignant neoplasm of lymph nodes of head, face and neck ([place]) 01/30/2022   SOB (shortness of breath) 05/19/2012   Cough 05/19/2012   COPD (chronic obstructive pulmonary disease) ([place]) 05/19/2012   HTN (hypertension) 05/19/2012   COPD exacerbation ([place]) 05/19/2012    PCP: Lujean Amel, MD   REFERRING PROVIDER: Eppie Gibson, MD  REFERRING DIAG: C77.0 (ICD-10-CM) - Secondary and unspecified malignant neoplasm of lymph  nodes of head, face and neck ([place])  THERAPY DIAG:  Stiffness of left shoulder, not elsewhere classified  Acute pain of left shoulder  Muscle weakness (generalized)  Lymphedema, not elsewhere classified  Aftercare following surgery for neoplasm  Abnormal posture  Metastasis to cervical lymph node ([place])  ONSET DATE: 06/20/22  Rationale for Evaluation and Treatment Rehabilitation  SUBJECTIVE:  SUBJECTIVE STATEMENT: I did not get through all the new exercises yesterday. I could not feel the tape.    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 He presented to his PCP on 01/05/22 with left neck lymphadenopathy. CT neck completed same day revealed an enlarged and heterogeneous left leel 2 lymph node measuring 5.0 X 2.8 cm noted as highly suspicious for nodal metastatic disease. No definite primary neoplasm was otherwise identified within the oral cavity, pharynx or larynx, or any other enlarged or suspicious lymph nodes elsewhere within the neck. 01/06/22 he saw Dr. Constance Holster for evaluation. During this visit, the patient reported first noticing the left neck mass 2 days prior to presentation, and denied any symptoms related to it. Subsequently, Dr. Constance Holster performed on laryngoscopy on this same date which revealed no abnormal findings. FNA of left neck mass was also performed during this visit which revealed findings consistent with squamous cell carcinoma. 01/25/22 PET completed revealing fairly symmetric hypermetabolic activity in the lingual tonsil region, No clear primary mass lesion was identified. PET also showed an enlarged hypermetabolic left level 2 lymph node measuring 24 mm in the short axis, though >3cm in greatest dimension to my eye, with an SUV  max of 15.7. No additional enlarged or hypermetabolic cervical lymph nodes were appreciated. No signs of distant metastatic disease.  Bilateral lingual tonsil SUV uptake. 03/03/22 Biopsies completed with Dr. Constance Holster revealing Left neck-inferior jugular vein dissection: Metastatic squamous cell carcinoma involving 3/23 lymph nodes, with 2 lymph nodes from level II and 1 lymph node from level III involved by carcinoma (p16 positive; EBV negative). No evidence of extranodal extension; jugular vein negative for carcinoma; salivary gland negative for carcinoma. -- Biopsies of the vallecula, left tongue base, left tonsil, and nasopharynx negative for carcinoma. He will receive 30 fractions of radiation to his Oropharynx and bilateral neck. He started on 04/12/22 and will complete on 05/25/22. PAIN:  Are you having pain? No  PRECAUTIONS: Other: anterior neck lymphedema  WEIGHT BEARING RESTRICTIONS: No  FALLS:  Has patient fallen in last 6 months? No  LIVING ENVIRONMENT: Lives with: lives with their spouse Lives in: House/apartment Stairs: Yes; Internal: 15 steps; on right going up Has following equipment at home: None  OCCUPATION: retired  LEISURE: gym 3 days per week minimum, warms up on stationary bike, does weight machine and free weights  HAND DOMINANCE: right   PRIOR LEVEL OF FUNCTION: Independent  PATIENT GOALS: to get rid of the swelling   OBJECTIVE:  COGNITION: Overall cognitive status: Within functional limits for tasks assessed   PALPATION: Increased fibrosis and fullness in anterior neck  OBSERVATIONS / OTHER ASSESSMENTS: fullness in anterior neck  POSTURE: forward head, rounded shoulders  SHOULDER AROM:   WFL, though L shoulder is limited in certain directions secondary to nerve damage during surgery  SCAPULAR ROM: very limited L scapular motion with winging present; increased compensation   CERVICAL AROM:     Percent limited - 06/08/22 06/08/22 09/05/22  Flexion WFL WFL  25% limited secondary to edema  Extension 25% limited WFL 25% limited  Right lateral flexion 25% limited 25% limited WFL  Left lateral flexion 50% limited 25% limited 50% limited  Right rotation 25% limited 25% limited 25% limited  Left rotation 25% limited 25% limited 25% limited                          (  Blank rows=not tested)    UPPER EXTREMITY AROM/PROM:  A/PROM RIGHT   10/04/22   Shoulder extension 78  Shoulder flexion 147  Shoulder abduction 165  Shoulder internal rotation 65  Shoulder external rotation 86    (Blank rows = not tested)  A/PROM LEFT   10/04/22 LEFT 10/10/22 LEFT 10/19/22 LEFT 10/23/22 LEFT 10/25/22  Shoulder extension 54 69 77 68   Shoulder flexion 104 114 111 111 112  Shoulder abduction 65 68 78 72 82  Shoulder internal rotation 52      Shoulder external rotation 67        (Blank rows = not tested)   LYMPHEDEMA ASSESSMENT:      Circumference in cm 06/08/22 09/05/22 09/18/22 09/29/2022 10/02/22  4 cm superior to sternal notch around neck 39.8 41 42 40.5 39.9  6 cm superior to sternal notch around neck 41 43.2 43.5 42.5 41.1  8 cm superior to sternal notch around neck 43 45.5 45.5 46 43.3  (Blank rows=not tested)     CURRENT/PAST TREATMENTS:   Surgery type/date: L neck- inferior jugular vein dissection 3/23 nodes involved on 03/03/22   Radiation:completed 05/25/22      TODAY'S TREATMENT: 10/25/22: In R sidelying : ER x 10 reps with 1 lb with pt returning therapist demo; L shoulder flexion with therapist stabilizing shoulder to prevent compensation x 10; L shoulder abduction x 10 reps - then repeated all another set of 10 reps each; scapular mobs to L scapula in all directions In supine: scapular protraction x 10 reps; removed kinesiotape with adhesive remover Therapeutic Ex: On Dual [employer] machine using  7lbs: scapular retraction x 10 reps with pt returning therapist demo, L shoulder extension reactive isometrics x 10 with 3 lbs, L shoulder  flexion reactive isometrics x 10 with 3 lbs, L shoulder ER reactive isometrics x 10 with 3 lbs, standing shoulder extension x 7 lbs with v/c to keep scapular muscles engaged, standing shoulder ER x 3 lbs x 10 reps, standing flexion x 3 lbs with t/c to keep scapular muscles engaged Shoulder shrugs with 3 lbs bilaterally x 10 reps Overhead shoulder press on L only with no weight x 10 reps with no pain but pt felt like it was working [employer] on wall - scap retraction x 10 reps bilaterally with yellow band Forearms on wall walking forearms up with yellow band x 7 reps bilaterally  Supine snow angels to approx 90 degrees on L x 10 reps Supine over 1/2 foam roll with alt shoulder flexion x 10  Row with 3lb weight on L bent over chair for support x 10 reps Shoulder shrug x 10 reps with yellow band on L with focus on improving upper trap contraction 10/23/22: In R sidelying : ER x 10 reps with 1 lb with pt returning therapist demo; L shoulder flexion with therapist stabilizing shoulder to prevent compensation x 10; L shoulder abduction with therapist stabilizing shoulder to prevent compensation x 10 reps - then repeated all another set of 10 reps each with therapist provided less t/c for scapular stabilization; scapular mobs to L scapula in all directions In supine: scapular protraction x 10 reps Supine scapular strengthening series: narrow and wide grip x 10 reps each, shoulder ER, shoulder diagonals, shoulder horizontal abduction with pt returning therapist demo with yellow band  Kinesiotaping to L shoulder with anchor at lower mid back with stretch at 25 percent stretching to [facility] joint then another strip going from same starting point with 50%  stretch and slight overlap of original strip going to [facility] joint Therapeutic Ex: On Dual [employer] machine using  7lbs: scapular retraction x 10 reps with pt returning therapist demo, L shoulder extension reactive isometrics x 10 with 3 lbs, L shoulder flexion  reactive isometrics x 10 with 3 lbs, L shoulder ER reactive isometrics x 10 with 3 lbs, standing shoulder extension x 3 lbs with v/c to keep scapular muscles engaged, standing shoulder ER x 3 lbs x 10 reps With yellow band on doorknob instructed pt in L shoulder extension x 1 rep to demo and shoulder ER x 10 reps with pt demonstrating improved form with v/c to keep elbow at side  10/19/22: In R sidelying : ER x 10 reps with 1 lb with pt returning therapist demo; L shoulder flexion with therapist stabilizing shoulder to prevent compensation x 10; L shoulder abduction with therapist stabilizing shoulder to prevent compensation x 10 reps - then repeated all another set of 10 reps each with therapist provided less t/c for scapular stabilization; scapular mobs to L scapula in all directions Therapeutic Ex: On Dual [employer] machine using  7lbs: scapular retraction x 10 reps with pt returning therapist demo, L shoulder extension reactive isometrics x 10 with 3 lbs, L shoulder ER reactive isometrics x 10 with 3 lbs, standing shoulder extension x 3 lbs with v/c to keep scapular muscles engaged With yellow band on doorknob instructed pt in L shoulder extension x 1 rep to demo and shoulder ER x 10 reps with pt demonstrating improved form with v/c to keep elbow at side   10/10/22: In R sidelying : ER x 10 reps with 2 lb with pt returning therapist demo; L shoulder flexion with therapist stabilizing shoulder to prevent compensation x 10; L shoulder abduction with therapist stabilizing shoulder to prevent compensation x 10 reps - then repeated all another set of 10 reps each decreasing weight for ER to 1 lb since pt was having some discomfort with 2 lbs and therapist did not stabilize the second round and pt demonstrated improved scapular mobility and control; scapular mobs to L scapula in all directions Therapeutic Ex: On Dual [employer] using 3lbs: scapular retraction x 10 reps with pt returning  therapist demo, L shoulder extension reactive isometrics x 10, L shoulder ER reactive isometrics x 10 Single arm pec stretch at doorway x 30 sec - pt returned demo Reactive isometrics with red theraband tied to doorknob: L shoulder ER x 10 reps, L shoulder extension x 10 reps  10/04/22 In R sidelying : ER x 10 reps with 1 lb with pt returning therapist demo; L shoulder flexion with therapist stabilizing shoulder to prevent compensation x 10; L shoulder abduction with therapist stabilizing shoulder to prevent compensation x 10 reps; scapular mobs to L scapula in all directions In supine: shoulder flexion with dowel AAROM x 10 reps with pt returning therapist demo and v/c to keep scapular muscles engaged; L shoulder abduction with dowel x 10 reps at approx 20 deg from mat to prevent pain with v/c to decrease scapular compensation; horizontal abduction with red band x 10 reps with pt returning therapist demo In sitting: scapular retraction x 10 reps with red band and pt returning therapist demo  10/02/22 Remeasured circumferences and pt demonstrates a good decrease since last session and is now nearly at baseline. Assessed head and neck garment for fit. Therapist performed sequence as follows: short neck, 5 diaphragmatic breaths, bilateral axillary nodes, bilateral pectoral  nodes, anterior chest, short neck, posterior neck moving fluid towards pathway aimed at lateral neck, then lateral and anterior neck moving fluid towards pathway aimed at lateral neck then retracing all steps.   09/29/2022 Pt measured with good results Therapist performed sequence as follows: short neck, 5 diaphragmatic breaths, bilateral axillary nodes, bilateral pectoral nodes, anterior chest, short neck, posterior neck moving fluid towards pathway aimed at lateral neck, then lateral and anterior neck moving fluid towards pathway aimed at lateral neck then retracing all steps. Discussed wearing chip pack at night, but pt is going to  try and wear his night garment but without the straps around his head to see if it helps.  09/18/22- Therapist performed sequence as follows: short neck, 5 diaphragmatic breaths, bilateral axillary nodes, bilateral pectoral nodes, anterior chest, short neck, posterior neck moving fluid towards pathway aimed at lateral neck, then lateral and anterior neck moving fluid towards pathway aimed at lateral neck then retracing all steps. Educated pt to continue with scar mobilization and to try and wear his garment overnight.   PATIENT EDUCATION:  Education details: anatomy and physiology of the lymphatic system, basic principles of self MLD, wear chip pack Person educated: Patient Education method: Explanation Education comprehension: verbalized understanding  HOME EXERCISE PROGRAM: Wear head and neck garment as much as possible  Access Code: [email or web address] URL: [email or web address] Date: 10/04/2022 Prepared by: Manus Gunning  Exercises - Sidelying Shoulder External Rotation with Dumbbell  - 1 x daily - 7 x weekly - 1-3 sets - 10 reps - Sidelying Shoulder Abduction Full Range of Motion  - 1 x daily - 7 x weekly - 1-3 sets - 10 reps - Sidelying Shoulder Flexion 15 Degrees  - 1 x daily - 7 x weekly - 1-3 sets - 10 reps - Supine Shoulder Horizontal Abduction with Resistance  - 1 x daily - 7 x weekly - 1-3 sets - 10 reps - Seated Shoulder Row with Anchored Resistance  - 1 x daily - 7 x weekly - 1-3 sets - 10 reps - Supine Shoulder Abduction AAROM with Dowel  - 1 x daily - 7 x weekly - 1 sets - 10 reps - Supine Shoulder Flexion with Dowel AAROM - Palms Up  - 1 x daily - 7 x weekly - 1 sets - 10 reps - Single Arm Doorway Pec Stretch at 90 Degrees Abduction  - 1-2 x daily - 7 x weekly - 1 sets - 3 reps - 30 hold - Shoulder External Rotation Reactive Isometrics  - 1 x daily - 7 x weekly - 1 sets - 10 reps - Shoulder Extension Reactive Isometrics with Elbow Extended  - 1 x daily - 7 x  weekly - 1 sets - 10 reps - Resisted Shoulder Extension with Yellow Theraband x 1 daily - 7x weekly - 1 set -1 10 reps -Resisted Shoulder ER with Yellow Theraband x 1 daily - 7x/wk - 1 set- 10 reps  ASSESSMENT:  CLINICAL IMPRESSION: Remeasured L shoulder ROM and his abduction ROM has improved 10 degrees since last session. Removed kinesiotape with adhesive remover and did not reapply this session to give his skin a break. Continued with strengthening exercises and exercises aimed to improve L upper trap recruitment.   OBJECTIVE IMPAIRMENTS: decreased ROM, decreased strength, increased edema, increased fascial restrictions, impaired UE functional use, postural dysfunction, and pain.   ACTIVITY LIMITATIONS: carrying, lifting, and reach over head  PARTICIPATION LIMITATIONS: community activity  PERSONAL FACTORS:  none  are also  affecting patient's functional outcome.   REHAB POTENTIAL: Good  CLINICAL DECISION MAKING: Stable/uncomplicated  EVALUATION COMPLEXITY: Low  GOALS: Goals reviewed with patient? Yes   LONG TERM GOALS: Target date: 11/22/2022    Pt will demonstrate a 2 cm decrease at edema 8 cm superior to sternal notch to decrease risk of infection. Baseline:  Goal status: MET Went from 46 to 43.3 10/04/22  2.  Pt will be independent in self MLD for long term management of edema.  Baseline:  Goal status: MET pt reports he is independent with this 10/04/22  3.  Pt will obtain appropriate compression garments for long term management of lymphedema.  Baseline:  Goal status: MET 10/04/22- got tribute night head and neck  4.  Pt will return to baseline cervical ROM to allow pt to return to PLOF.  Baseline:  Goal status: MET 10/04/22- cervical flexion no longer limited by edema  5. Pt will demonstrate 135 degrees of L shoulder flexion to allow her to reach overhead.  Goal status: NEW  6. Pt will demonstrate 130 degrees of L shoulder abduction to allow her to reach out to the  side.  Goal status: NEW  7. Pt will report he is able to reach out of his window at the drive through with no difficulty.  Goal status: NEW  8. Pt will be independent in a home exercise program for long term stretching and strengthening.   Goal status: NEW   PLAN:  PT FREQUENCY: 2x/week  PT DURATION: 4 weeks  PLANNED INTERVENTIONS: Therapeutic exercises, Therapeutic activity, Patient/Family education, Self Care, Joint mobilization, Orthotic/Fit training, Manual lymph drainage, Compression bandaging, scar mobilization, Vasopneumatic device, and Manual therapy  PLAN FOR NEXT SESSION: reapply kinesiotape to L shoulder, how are new supine scap exericsies? PROM to L shoulder, L scapula stabilization exercises, sidelying ER, supine abd and flex, continue reactive isometrics   [employer], PT 10/25/2022, 10:57 AM

## 2022-10-31 ENCOUNTER — Ambulatory Visit: Payer: Medicare Other | Admitting: Physical Therapy

## 2022-10-31 ENCOUNTER — Encounter: Payer: Self-pay | Admitting: Physical Therapy

## 2022-10-31 DIAGNOSIS — M25512 Pain in left shoulder: Secondary | ICD-10-CM

## 2022-10-31 DIAGNOSIS — M25612 Stiffness of left shoulder, not elsewhere classified: Secondary | ICD-10-CM

## 2022-10-31 DIAGNOSIS — M6281 Muscle weakness (generalized): Secondary | ICD-10-CM

## 2022-10-31 NOTE — Therapy (Signed)
OUTPATIENT PHYSICAL THERAPY ONCOLOGY TREATMENT  Patient Name: CONO GEBHARD MRN: [id number] DOB:[birth year], 71 y.o., male Today's Date: 10/31/2022   PT End of Session - 10/31/22 1009     Visit Number 12    Number of Visits 15    Date for PT Re-Evaluation 11/01/22    PT Start Time 1007    PT Stop Time 1049    PT Time Calculation (min) 42 min    Activity Tolerance Patient tolerated treatment well    Behavior During Therapy WFL for tasks assessed/performed              Past Medical History:  Diagnosis Date   Allergy    Bronchitis    chronic   Cancer ([place])    Constipation    COPD (chronic obstructive pulmonary disease) ([place])    Dyspnea    Hypercholesteremia    Hypertension    Past Surgical History:  Procedure Laterality Date   COLONOSCOPY     FRACTURE SURGERY     right wrist   LARYNGOSCOPY AND ESOPHAGOSCOPY N/A 03/03/2022   Procedure: DIRECT LARYNGOSCOPY AND ESOPHAGOSCOPY BIOPSIES AND FROZEN SECTIONS;  Surgeon: Izora Gala, MD;  Location: [facility];  Service: ENT;  Laterality: N/A;   Lipo Suction     NASOPHARYNGOSCOPY N/A 03/03/2022   Procedure: NASOPHARYNGOSCOPY;  Surgeon: Izora Gala, MD;  Location: [facility];  Service: ENT;  Laterality: N/A;   POLYPECTOMY     RADICAL NECK DISSECTION Left 03/03/2022   Procedure: NECK DISSECTION;  Surgeon: Izora Gala, MD;  Location: [facility];  Service: ENT;  Laterality: Left;   Patient Active Problem List   Diagnosis Date Noted   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 ([place]) 03/03/2022   Secondary and unspecified malignant neoplasm of lymph nodes of head, face and neck ([place]) 01/30/2022   SOB (shortness of breath) 05/19/2012   Cough 05/19/2012   COPD (chronic obstructive pulmonary disease) ([place]) 05/19/2012   HTN (hypertension) 05/19/2012   COPD exacerbation ([place]) 05/19/2012    PCP: Lujean Amel, MD   REFERRING PROVIDER: Eppie Gibson, MD  REFERRING DIAG: C77.0 (ICD-10-CM) - Secondary and unspecified malignant neoplasm of lymph  nodes of head, face and neck ([place])  THERAPY DIAG:  Stiffness of left shoulder, not elsewhere classified  Acute pain of left shoulder  Muscle weakness (generalized)  ONSET DATE: 06/20/22  Rationale for Evaluation and Treatment Rehabilitation  SUBJECTIVE:                                                                                                                                                                                           SUBJECTIVE STATEMENT: The exercises  are going really well.    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 He presented to his PCP on 01/05/22 with left neck lymphadenopathy. CT neck completed same day revealed an enlarged and heterogeneous left leel 2 lymph node measuring 5.0 X 2.8 cm noted as highly suspicious for nodal metastatic disease. No definite primary neoplasm was otherwise identified within the oral cavity, pharynx or larynx, or any other enlarged or suspicious lymph nodes elsewhere within the neck. 01/06/22 he saw Dr. Constance Holster for evaluation. During this visit, the patient reported first noticing the left neck mass 2 days prior to presentation, and denied any symptoms related to it. Subsequently, Dr. Constance Holster performed on laryngoscopy on this same date which revealed no abnormal findings. FNA of left neck mass was also performed during this visit which revealed findings consistent with squamous cell carcinoma. 01/25/22 PET completed revealing fairly symmetric hypermetabolic activity in the lingual tonsil region, No clear primary mass lesion was identified. PET also showed an enlarged hypermetabolic left level 2 lymph node measuring 24 mm in the short axis, though >3cm in greatest dimension to my eye, with an SUV max of 15.7. No additional enlarged or hypermetabolic cervical lymph nodes were appreciated. No signs of distant metastatic disease.  Bilateral lingual tonsil SUV uptake. 03/03/22 Biopsies  completed with Dr. Constance Holster revealing Left neck-inferior jugular vein dissection: Metastatic squamous cell carcinoma involving 3/23 lymph nodes, with 2 lymph nodes from level II and 1 lymph node from level III involved by carcinoma (p16 positive; EBV negative). No evidence of extranodal extension; jugular vein negative for carcinoma; salivary gland negative for carcinoma. -- Biopsies of the vallecula, left tongue base, left tonsil, and nasopharynx negative for carcinoma. He will receive 30 fractions of radiation to his Oropharynx and bilateral neck. He started on 04/12/22 and will complete on 05/25/22. PAIN:  Are you having pain? No  PRECAUTIONS: Other: anterior neck lymphedema  WEIGHT BEARING RESTRICTIONS: No  FALLS:  Has patient fallen in last 6 months? No  LIVING ENVIRONMENT: Lives with: lives with their spouse Lives in: House/apartment Stairs: Yes; Internal: 15 steps; on right going up Has following equipment at home: None  OCCUPATION: retired  LEISURE: gym 3 days per week minimum, warms up on stationary bike, does weight machine and free weights  HAND DOMINANCE: right   PRIOR LEVEL OF FUNCTION: Independent  PATIENT GOALS: to get rid of the swelling   OBJECTIVE:  COGNITION: Overall cognitive status: Within functional limits for tasks assessed   PALPATION: Increased fibrosis and fullness in anterior neck  OBSERVATIONS / OTHER ASSESSMENTS: fullness in anterior neck  POSTURE: forward head, rounded shoulders  SHOULDER AROM:   WFL, though L shoulder is limited in certain directions secondary to nerve damage during surgery  SCAPULAR ROM: very limited L scapular motion with winging present; increased compensation   CERVICAL AROM:     Percent limited - 06/08/22 06/08/22 09/05/22  Flexion WFL WFL 25% limited secondary to edema  Extension 25% limited WFL 25% limited  Right lateral flexion 25% limited 25% limited WFL  Left lateral flexion 50% limited 25% limited 50% limited  Right  rotation 25% limited 25% limited 25% limited  Left rotation 25% limited 25% limited 25% limited                          (Blank rows=not tested)    UPPER EXTREMITY AROM/PROM:  A/PROM RIGHT  10/04/22   Shoulder extension 78  Shoulder flexion 147  Shoulder abduction 165  Shoulder internal rotation 65  Shoulder external rotation 86    (Blank rows = not tested)  A/PROM LEFT   10/04/22 LEFT 10/10/22 LEFT 10/19/22 LEFT 10/23/22 LEFT 10/25/22 LEFT 10/31/22  Shoulder extension 54 69 77 68    Shoulder flexion 104 114 111 111 112 115  Shoulder abduction 65 68 78 72 82 111  Shoulder internal rotation 52       Shoulder external rotation 67         (Blank rows = not tested)   LYMPHEDEMA ASSESSMENT:      Circumference in cm 06/08/22 09/05/22 09/18/22 09/29/2022 10/02/22  4 cm superior to sternal notch around neck 39.8 41 42 40.5 39.9  6 cm superior to sternal notch around neck 41 43.2 43.5 42.5 41.1  8 cm superior to sternal notch around neck 43 45.5 45.5 46 43.3  (Blank rows=not tested)     CURRENT/PAST TREATMENTS:   Surgery type/date: L neck- inferior jugular vein dissection 3/23 nodes involved on 03/03/22   Radiation:completed 05/25/22      TODAY'S TREATMENT: 10/31/22: In R sidelying : ER x 10 reps with 1 lb ; L shoulder flexion  x 10; L shoulder abduction x 10 reps - then repeated all another set of 10 reps each Therapeutic Ex: On Dual [employer] machine using  7lbs: scapular retraction x 10 reps with pt returning therapist demo, L shoulder extension reactive isometrics x 10 with 7 lbs, L shoulder flexion reactive isometrics x 10 with 3 lbs, L shoulder ER reactive isometrics x 10 with 3 lbs, standing shoulder extension x 7 lbs , standing shoulder ER x 3 lbs x 10 reps, standing flexion x 3 lbs with t/c to keep scapular muscles engaged Shoulder shrugs with 9 lbs bilaterally x 10 reps Overhead shoulder press on L with 3 lbs x 10 reps with no pain and better form than last  week Standing overhead tricep extension with 3lb weight on L  10/25/22: In R sidelying : ER x 10 reps with 1 lb with pt returning therapist demo; L shoulder flexion with therapist stabilizing shoulder to prevent compensation x 10; L shoulder abduction x 10 reps - then repeated all another set of 10 reps each; scapular mobs to L scapula in all directions In supine: scapular protraction x 10 reps; removed kinesiotape with adhesive remover Therapeutic Ex: On Dual [employer] machine using  7lbs: scapular retraction x 10 reps with pt returning therapist demo, L shoulder extension reactive isometrics x 10 with 3 lbs, L shoulder flexion reactive isometrics x 10 with 3 lbs, L shoulder ER reactive isometrics x 10 with 3 lbs, standing shoulder extension x 7 lbs with v/c to keep scapular muscles engaged, standing shoulder ER x 3 lbs x 10 reps, standing flexion x 3 lbs with t/c to keep scapular muscles engaged Shoulder shrugs with 3 lbs bilaterally x 10 reps Overhead shoulder press on L only with no weight x 10 reps with no pain but pt felt like it was working [employer] on wall - scap retraction x 10 reps bilaterally with yellow band Forearms on wall walking forearms up with yellow band x 7 reps bilaterally  Supine snow angels to approx 90 degrees on L x 10 reps Supine over 1/2 foam roll with alt shoulder flexion x 10  Row with 3lb weight on L bent over chair for support x 10 reps Shoulder shrug x 10  reps with yellow band on L with focus on improving upper trap contraction 10/23/22: In R sidelying : ER x 10 reps with 1 lb with pt returning therapist demo; L shoulder flexion with therapist stabilizing shoulder to prevent compensation x 10; L shoulder abduction with therapist stabilizing shoulder to prevent compensation x 10 reps - then repeated all another set of 10 reps each with therapist provided less t/c for scapular stabilization; scapular mobs to L scapula in all directions In supine: scapular protraction  x 10 reps Supine scapular strengthening series: narrow and wide grip x 10 reps each, shoulder ER, shoulder diagonals, shoulder horizontal abduction with pt returning therapist demo with yellow band  Kinesiotaping to L shoulder with anchor at lower mid back with stretch at 25 percent stretching to [facility] joint then another strip going from same starting point with 50% stretch and slight overlap of original strip going to [facility] joint Therapeutic Ex: On Dual [employer] machine using  7lbs: scapular retraction x 10 reps with pt returning therapist demo, L shoulder extension reactive isometrics x 10 with 3 lbs, L shoulder flexion reactive isometrics x 10 with 3 lbs, L shoulder ER reactive isometrics x 10 with 3 lbs, standing shoulder extension x 3 lbs with v/c to keep scapular muscles engaged, standing shoulder ER x 3 lbs x 10 reps With yellow band on doorknob instructed pt in L shoulder extension x 1 rep to demo and shoulder ER x 10 reps with pt demonstrating improved form with v/c to keep elbow at side  10/19/22: In R sidelying : ER x 10 reps with 1 lb with pt returning therapist demo; L shoulder flexion with therapist stabilizing shoulder to prevent compensation x 10; L shoulder abduction with therapist stabilizing shoulder to prevent compensation x 10 reps - then repeated all another set of 10 reps each with therapist provided less t/c for scapular stabilization; scapular mobs to L scapula in all directions Therapeutic Ex: On Dual [employer] machine using  7lbs: scapular retraction x 10 reps with pt returning therapist demo, L shoulder extension reactive isometrics x 10 with 3 lbs, L shoulder ER reactive isometrics x 10 with 3 lbs, standing shoulder extension x 3 lbs with v/c to keep scapular muscles engaged With yellow band on doorknob instructed pt in L shoulder extension x 1 rep to demo and shoulder ER x 10 reps with pt demonstrating improved form with v/c to keep elbow at side   10/10/22: In R sidelying  : ER x 10 reps with 2 lb with pt returning therapist demo; L shoulder flexion with therapist stabilizing shoulder to prevent compensation x 10; L shoulder abduction with therapist stabilizing shoulder to prevent compensation x 10 reps - then repeated all another set of 10 reps each decreasing weight for ER to 1 lb since pt was having some discomfort with 2 lbs and therapist did not stabilize the second round and pt demonstrated improved scapular mobility and control; scapular mobs to L scapula in all directions Therapeutic Ex: On Dual [employer] using 3lbs: scapular retraction x 10 reps with pt returning therapist demo, L shoulder extension reactive isometrics x 10, L shoulder ER reactive isometrics x 10 Single arm pec stretch at doorway x 30 sec - pt returned demo Reactive isometrics with red theraband tied to doorknob: L shoulder ER x 10 reps, L shoulder extension x 10 reps  10/04/22 In R sidelying : ER x 10 reps with 1 lb with pt returning therapist demo; L shoulder  flexion with therapist stabilizing shoulder to prevent compensation x 10; L shoulder abduction with therapist stabilizing shoulder to prevent compensation x 10 reps; scapular mobs to L scapula in all directions In supine: shoulder flexion with dowel AAROM x 10 reps with pt returning therapist demo and v/c to keep scapular muscles engaged; L shoulder abduction with dowel x 10 reps at approx 20 deg from mat to prevent pain with v/c to decrease scapular compensation; horizontal abduction with red band x 10 reps with pt returning therapist demo In sitting: scapular retraction x 10 reps with red band and pt returning therapist demo  10/02/22 Remeasured circumferences and pt demonstrates a good decrease since last session and is now nearly at baseline. Assessed head and neck garment for fit. Therapist performed sequence as follows: short neck, 5 diaphragmatic breaths, bilateral axillary nodes, bilateral pectoral nodes, anterior chest,  short neck, posterior neck moving fluid towards pathway aimed at lateral neck, then lateral and anterior neck moving fluid towards pathway aimed at lateral neck then retracing all steps.   09/29/2022 Pt measured with good results Therapist performed sequence as follows: short neck, 5 diaphragmatic breaths, bilateral axillary nodes, bilateral pectoral nodes, anterior chest, short neck, posterior neck moving fluid towards pathway aimed at lateral neck, then lateral and anterior neck moving fluid towards pathway aimed at lateral neck then retracing all steps. Discussed wearing chip pack at night, but pt is going to try and wear his night garment but without the straps around his head to see if it helps.  09/18/22- Therapist performed sequence as follows: short neck, 5 diaphragmatic breaths, bilateral axillary nodes, bilateral pectoral nodes, anterior chest, short neck, posterior neck moving fluid towards pathway aimed at lateral neck, then lateral and anterior neck moving fluid towards pathway aimed at lateral neck then retracing all steps. Educated pt to continue with scar mobilization and to try and wear his garment overnight.   PATIENT EDUCATION:  Education details: anatomy and physiology of the lymphatic system, basic principles of self MLD, wear chip pack Person educated: Patient Education method: Explanation Education comprehension: verbalized understanding  HOME EXERCISE PROGRAM: Wear head and neck garment as much as possible  Access Code: [email or web address] URL: [email or web address] Date: 10/31/2022 Prepared by: Manus Gunning  Exercises - Sidelying Shoulder External Rotation with Dumbbell  - 1 x daily - 7 x weekly - 1-3 sets - 10 reps - Sidelying Shoulder Abduction Full Range of Motion  - 1 x daily - 7 x weekly - 1-3 sets - 10 reps - Sidelying Shoulder Flexion 15 Degrees  - 1 x daily - 7 x weekly - 1-3 sets - 10 reps - Supine Shoulder Horizontal Abduction with Resistance   - 1 x daily - 7 x weekly - 1-3 sets - 10 reps - Seated Shoulder Row with Anchored Resistance  - 1 x daily - 7 x weekly - 1-3 sets - 10 reps - Supine Shoulder Abduction AAROM with Dowel  - 1 x daily - 7 x weekly - 1 sets - 10 reps - Supine Shoulder Flexion with Dowel AAROM - Palms Up  - 1 x daily - 7 x weekly - 1 sets - 10 reps - Single Arm Doorway Pec Stretch at 90 Degrees Abduction  - 1-2 x daily - 7 x weekly - 1 sets - 3 reps - 30 hold - Shoulder External Rotation Reactive Isometrics  - 1 x daily - 7 x weekly - 1 sets - 10 reps - Shoulder Extension Reactive Isometrics with Elbow  Extended  - 1 x daily - 7 x weekly - 1 sets - 10 reps - Standing Shoulder Flexion Reactive Isometric  - 1 x daily - 7 x weekly - 1 sets - 10 reps - Shoulder External Rotation with Anchored Resistance  - 1 x daily - 7 x weekly - 1 sets - 10 reps - Single Arm Shoulder Extension with Anchored Resistance  - 1 x daily - 7 x weekly - 1 sets - 10 reps - Standing Bent Over Single Arm Shoulder Row  - 1 x daily - 7 x weekly - 1 sets - 10 reps - Shoulder Overhead Press in Abduction with Dumbbells  - 1 x daily - 7 x weekly - 1 sets - 10 reps - Standing Shoulder Shrugs with Barbell  - 1 x daily - 7 x weekly - 1 sets - 10 reps  ASSESSMENT:  CLINICAL IMPRESSION: Remeasured L shoulder ROM and his abduction ROM has improved another 30 degrees since last session. Updated pt's home exercise program to allow pt to use the cable machine at the gym instead of the band exercises. Pt is progressing towards all goals in therapy. He will try new exercises at the gym and therapist will answer any questions at next session. Plan will be to d/c at next session since pt is nearly independent with his HEP.   OBJECTIVE IMPAIRMENTS: decreased ROM, decreased strength, increased edema, increased fascial restrictions, impaired UE functional use, postural dysfunction, and pain.   ACTIVITY LIMITATIONS: carrying, lifting, and reach over  head  PARTICIPATION LIMITATIONS: community activity  PERSONAL FACTORS:  none  are also affecting patient's functional outcome.   REHAB POTENTIAL: Good  CLINICAL DECISION MAKING: Stable/uncomplicated  EVALUATION COMPLEXITY: Low  GOALS: Goals reviewed with patient? Yes   LONG TERM GOALS: Target date: 11/28/2022    Pt will demonstrate a 2 cm decrease at edema 8 cm superior to sternal notch to decrease risk of infection. Baseline:  Goal status: MET Went from 46 to 43.3 10/04/22  2.  Pt will be independent in self MLD for long term management of edema.  Baseline:  Goal status: MET pt reports he is independent with this 10/04/22  3.  Pt will obtain appropriate compression garments for long term management of lymphedema.  Baseline:  Goal status: MET 10/04/22- got tribute night head and neck  4.  Pt will return to baseline cervical ROM to allow pt to return to PLOF.  Baseline:  Goal status: MET 10/04/22- cervical flexion no longer limited by edema  5. Pt will demonstrate 135 degrees of L shoulder flexion to allow her to reach overhead.  Goal status: NEW  6. Pt will demonstrate 130 degrees of L shoulder abduction to allow her to reach out to the side.  Goal status: NEW  7. Pt will report he is able to reach out of his window at the drive through with no difficulty.  Goal status: NEW  8. Pt will be independent in a home exercise program for long term stretching and strengthening.   Goal status: NEW   PLAN:  PT FREQUENCY: 2x/week  PT DURATION: 4 weeks  PLANNED INTERVENTIONS: Therapeutic exercises, Therapeutic activity, Patient/Family education, Self Care, Joint mobilization, Orthotic/Fit training, Manual lymph drainage, Compression bandaging, scar mobilization, Vasopneumatic device, and Manual therapy  PLAN FOR NEXT SESSION: assess goals and d/c?, how are new supine scap exericsies? PROM to L shoulder, L scapula stabilization exercises, sidelying ER, supine abd and flex,  continue reactive isometrics   Suzanna Zahn  Wynelle Beckmann, PT 10/31/2022, 10:57 AM

## 2022-11-02 ENCOUNTER — Encounter: Payer: Self-pay | Admitting: Physical Therapy

## 2022-11-02 ENCOUNTER — Ambulatory Visit: Payer: Medicare Other | Admitting: Physical Therapy

## 2022-11-02 DIAGNOSIS — M25612 Stiffness of left shoulder, not elsewhere classified: Secondary | ICD-10-CM | POA: Diagnosis not present

## 2022-11-02 DIAGNOSIS — M6281 Muscle weakness (generalized): Secondary | ICD-10-CM

## 2022-11-02 DIAGNOSIS — C770 Secondary and unspecified malignant neoplasm of lymph nodes of head, face and neck: Secondary | ICD-10-CM

## 2022-11-02 DIAGNOSIS — Z483 Aftercare following surgery for neoplasm: Secondary | ICD-10-CM

## 2022-11-02 DIAGNOSIS — R293 Abnormal posture: Secondary | ICD-10-CM

## 2022-11-02 DIAGNOSIS — M25512 Pain in left shoulder: Secondary | ICD-10-CM

## 2022-11-02 DIAGNOSIS — I890 Lymphedema, not elsewhere classified: Secondary | ICD-10-CM

## 2022-11-02 NOTE — Therapy (Signed)
OUTPATIENT PHYSICAL THERAPY ONCOLOGY TREATMENT  Patient Name: Brad [name] MRN: [id number] DOB:10-Apr-1951, 71 y.o., male Today's Date: 11/02/2022   PT End of Session - 11/02/22 1002     Visit Number 13    Number of Visits 15    Date for PT Re-Evaluation 11/01/22    PT Start Time 1001    PT Stop Time 1025    PT Time Calculation (min) 24 min    Activity Tolerance Patient tolerated treatment well    Behavior During Therapy WFL for tasks assessed/performed               Past Medical History:  Diagnosis Date   Allergy    Bronchitis    chronic   Cancer ([place])    Constipation    COPD (chronic obstructive pulmonary disease) ([place])    Dyspnea    Hypercholesteremia    Hypertension    Past Surgical History:  Procedure Laterality Date   COLONOSCOPY     FRACTURE SURGERY     right wrist   LARYNGOSCOPY AND ESOPHAGOSCOPY N/A 03/03/2022   Procedure: DIRECT LARYNGOSCOPY AND ESOPHAGOSCOPY BIOPSIES AND FROZEN SECTIONS;  Surgeon: Izora Gala, MD;  Location: [place];  Service: ENT;  Laterality: N/A;   Lipo Suction     NASOPHARYNGOSCOPY N/A 03/03/2022   Procedure: NASOPHARYNGOSCOPY;  Surgeon: Izora Gala, MD;  Location: [facility];  Service: ENT;  Laterality: N/A;   POLYPECTOMY     RADICAL NECK DISSECTION Left 03/03/2022   Procedure: NECK DISSECTION;  Surgeon: Izora Gala, MD;  Location: [facility];  Service: ENT;  Laterality: Left;   Patient Active Problem List   Diagnosis Date Noted   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 ([place]) 03/03/2022   Secondary and unspecified malignant neoplasm of lymph nodes of head, face and neck ([place]) 01/30/2022   SOB (shortness of breath) 05/19/2012   Cough 05/19/2012   COPD (chronic obstructive pulmonary disease) ([place]) 05/19/2012   HTN (hypertension) 05/19/2012   COPD exacerbation ([place]) 05/19/2012    PCP: Lujean Amel, MD   REFERRING PROVIDER: Eppie Gibson, MD  REFERRING DIAG: C77.0 (ICD-10-CM) - Secondary and unspecified malignant neoplasm of lymph  nodes of head, face and neck ([place])  THERAPY DIAG:  Stiffness of left shoulder, not elsewhere classified  Acute pain of left shoulder  Muscle weakness (generalized)  Lymphedema, not elsewhere classified  Aftercare following surgery for neoplasm  Abnormal posture  Metastasis to cervical lymph node ([place])  ONSET DATE: 06/20/22  Rationale for Evaluation and Treatment Rehabilitation  SUBJECTIVE:  SUBJECTIVE STATEMENT: I did the exercises in the gym and I did not have any problems. I am not having any pain.    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 He presented to his PCP on 01/05/22 with left neck lymphadenopathy. CT neck completed same day revealed an enlarged and heterogeneous left leel 2 lymph node measuring 5.0 X 2.8 cm noted as highly suspicious for nodal metastatic disease. No definite primary neoplasm was otherwise identified within the oral cavity, pharynx or larynx, or any other enlarged or suspicious lymph nodes elsewhere within the neck. 01/06/22 he saw Dr. Constance Holster for evaluation. During this visit, the patient reported first noticing the left neck mass 2 days prior to presentation, and denied any symptoms related to it. Subsequently, Dr. Constance Holster performed on laryngoscopy on this same date which revealed no abnormal findings. FNA of left neck mass was also performed during this visit which revealed findings consistent with squamous cell carcinoma. 01/25/22 PET completed revealing fairly symmetric hypermetabolic activity in the lingual tonsil region, No clear primary mass lesion was identified. PET also showed an enlarged hypermetabolic left level 2 lymph node measuring 24 mm in the short axis, though >3cm in greatest dimension to my eye, with  an SUV max of 15.7. No additional enlarged or hypermetabolic cervical lymph nodes were appreciated. No signs of distant metastatic disease.  Bilateral lingual tonsil SUV uptake. 03/03/22 Biopsies completed with Dr. Constance Holster revealing Left neck-inferior jugular vein dissection: Metastatic squamous cell carcinoma involving 3/23 lymph nodes, with 2 lymph nodes from level II and 1 lymph node from level III involved by carcinoma (p16 positive; EBV negative). No evidence of extranodal extension; jugular vein negative for carcinoma; salivary gland negative for carcinoma. -- Biopsies of the vallecula, left tongue base, left tonsil, and nasopharynx negative for carcinoma. He will receive 30 fractions of radiation to his Oropharynx and bilateral neck. He started on 04/12/22 and will complete on 05/25/22. PAIN:  Are you having pain? No  PRECAUTIONS: Other: anterior neck lymphedema  WEIGHT BEARING RESTRICTIONS: No  FALLS:  Has patient fallen in last 6 months? No  LIVING ENVIRONMENT: Lives with: lives with their spouse Lives in: House/apartment Stairs: Yes; Internal: 15 steps; on right going up Has following equipment at home: None  OCCUPATION: retired  LEISURE: gym 3 days per week minimum, warms up on stationary bike, does weight machine and free weights  HAND DOMINANCE: right   PRIOR LEVEL OF FUNCTION: Independent  PATIENT GOALS: to get rid of the swelling   OBJECTIVE:  COGNITION: Overall cognitive status: Within functional limits for tasks assessed   PALPATION: Increased fibrosis and fullness in anterior neck  OBSERVATIONS / OTHER ASSESSMENTS: fullness in anterior neck  POSTURE: forward head, rounded shoulders  SHOULDER AROM:   WFL, though L shoulder is limited in certain directions secondary to nerve damage during surgery  SCAPULAR ROM: very limited L scapular motion with winging present; increased compensation   CERVICAL AROM:     Percent limited - 06/08/22 06/08/22 09/05/22  Flexion WFL  WFL 25% limited secondary to edema  Extension 25% limited WFL 25% limited  Right lateral flexion 25% limited 25% limited WFL  Left lateral flexion 50% limited 25% limited 50% limited  Right rotation 25% limited 25% limited 25% limited  Left rotation 25% limited 25% limited 25% limited                          (  Blank rows=not tested)    UPPER EXTREMITY AROM/PROM:  A/PROM RIGHT   10/04/22   Shoulder extension 78  Shoulder flexion 147  Shoulder abduction 165  Shoulder internal rotation 65  Shoulder external rotation 86    (Blank rows = not tested)  A/PROM LEFT   10/04/22 LEFT 10/10/22 LEFT 10/19/22 LEFT 10/23/22 LEFT 10/25/22 LEFT 10/31/22 11/02/22  Shoulder extension 54 69 77 68     Shoulder flexion 104 114 111 111 112 115 125  Shoulder abduction 65 68 78 72 82 111 120  Shoulder internal rotation 52        Shoulder external rotation 67          (Blank rows = not tested)   LYMPHEDEMA ASSESSMENT:      Circumference in cm 06/08/22 09/05/22 09/18/22 09/29/2022 10/02/22  4 cm superior to sternal notch around neck 39.8 41 42 40.5 39.9  6 cm superior to sternal notch around neck 41 43.2 43.5 42.5 41.1  8 cm superior to sternal notch around neck 43 45.5 45.5 46 43.3  (Blank rows=not tested)     CURRENT/PAST TREATMENTS:   Surgery type/date: L neck- inferior jugular vein dissection 3/23 nodes involved on 03/03/22   Radiation:completed 05/25/22      TODAY'S TREATMENT: 11/02/22: In R sidelying : ER x 10 reps with 1 lb ; L shoulder flexion  x 10; L shoulder abduction x 10 reps - then repeated all another set of 10 reps each Therapeutic Ex: On Dual [employer] machine using  7lbs: scapular retraction x 10 reps , L shoulder extension reactive isometrics x 10 with 3 lbs, L shoulder flexion reactive isometrics x 10 with 3 lbs, L shoulder ER reactive isometrics x 10 with 3 lbs, standing shoulder extension x 3 lbs , Shoulder shrugs with 9 lbs bilaterally x 10 reps Overhead shoulder press on  L with 3 lbs x 10 reps with no pain Standing overhead tricep extension with 3lb weight on L   10/31/22: In R sidelying : ER x 10 reps with 1 lb ; L shoulder flexion  x 10; L shoulder abduction x 10 reps - then repeated all another set of 10 reps each Therapeutic Ex: On Dual [employer] machine using  7lbs: scapular retraction x 10 reps with pt returning therapist demo, L shoulder extension reactive isometrics x 10 with 7 lbs, L shoulder flexion reactive isometrics x 10 with 3 lbs, L shoulder ER reactive isometrics x 10 with 3 lbs, standing shoulder extension x 7 lbs , standing shoulder ER x 3 lbs x 10 reps, standing flexion x 3 lbs with t/c to keep scapular muscles engaged Shoulder shrugs with 9 lbs bilaterally x 10 reps Overhead shoulder press on L with 3 lbs x 10 reps with no pain and better form than last week Standing overhead tricep extension with 3lb weight on L  10/25/22: In R sidelying : ER x 10 reps with 1 lb with pt returning therapist demo; L shoulder flexion with therapist stabilizing shoulder to prevent compensation x 10; L shoulder abduction x 10 reps - then repeated all another set of 10 reps each; scapular mobs to L scapula in all directions In supine: scapular protraction x 10 reps; removed kinesiotape with adhesive remover Therapeutic Ex: On Dual [employer] machine using  7lbs: scapular retraction x 10 reps with pt returning therapist demo, L shoulder extension reactive isometrics x 10 with 3 lbs, L shoulder flexion reactive isometrics x 10 with 3 lbs, L shoulder  ER reactive isometrics x 10 with 3 lbs, standing shoulder extension x 7 lbs with v/c to keep scapular muscles engaged, standing shoulder ER x 3 lbs x 10 reps, standing flexion x 3 lbs with t/c to keep scapular muscles engaged Shoulder shrugs with 3 lbs bilaterally x 10 reps Overhead shoulder press on L only with no weight x 10 reps with no pain but pt felt like it was working Midwife on wall - scap retraction x 10 reps  bilaterally with yellow band Forearms on wall walking forearms up with yellow band x 7 reps bilaterally  Supine snow angels to approx 90 degrees on L x 10 reps Supine over 1/2 foam roll with alt shoulder flexion x 10  Row with 3lb weight on L bent over chair for support x 10 reps Shoulder shrug x 10 reps with yellow band on L with focus on improving upper trap contraction 10/23/22: In R sidelying : ER x 10 reps with 1 lb with pt returning therapist demo; L shoulder flexion with therapist stabilizing shoulder to prevent compensation x 10; L shoulder abduction with therapist stabilizing shoulder to prevent compensation x 10 reps - then repeated all another set of 10 reps each with therapist provided less t/c for scapular stabilization; scapular mobs to L scapula in all directions In supine: scapular protraction x 10 reps Supine scapular strengthening series: narrow and wide grip x 10 reps each, shoulder ER, shoulder diagonals, shoulder horizontal abduction with pt returning therapist demo with yellow band  Kinesiotaping to L shoulder with anchor at lower mid back with stretch at 25 percent stretching to [facility] joint then another strip going from same starting point with 50% stretch and slight overlap of original strip going to [facility] joint Therapeutic Ex: On Dual [employer] machine using  7lbs: scapular retraction x 10 reps with pt returning therapist demo, L shoulder extension reactive isometrics x 10 with 3 lbs, L shoulder flexion reactive isometrics x 10 with 3 lbs, L shoulder ER reactive isometrics x 10 with 3 lbs, standing shoulder extension x 3 lbs with v/c to keep scapular muscles engaged, standing shoulder ER x 3 lbs x 10 reps With yellow band on doorknob instructed pt in L shoulder extension x 1 rep to demo and shoulder ER x 10 reps with pt demonstrating improved form with v/c to keep elbow at side  10/19/22: In R sidelying : ER x 10 reps with 1 lb with pt returning therapist demo; L shoulder flexion  with therapist stabilizing shoulder to prevent compensation x 10; L shoulder abduction with therapist stabilizing shoulder to prevent compensation x 10 reps - then repeated all another set of 10 reps each with therapist provided less t/c for scapular stabilization; scapular mobs to L scapula in all directions Therapeutic Ex: On Dual [employer] machine using  7lbs: scapular retraction x 10 reps with pt returning therapist demo, L shoulder extension reactive isometrics x 10 with 3 lbs, L shoulder ER reactive isometrics x 10 with 3 lbs, standing shoulder extension x 3 lbs with v/c to keep scapular muscles engaged With yellow band on doorknob instructed pt in L shoulder extension x 1 rep to demo and shoulder ER x 10 reps with pt demonstrating improved form with v/c to keep elbow at side   10/10/22: In R sidelying : ER x 10 reps with 2 lb with pt returning therapist demo; L shoulder flexion with therapist stabilizing shoulder to prevent compensation x 10; L shoulder abduction with therapist stabilizing shoulder  to prevent compensation x 10 reps - then repeated all another set of 10 reps each decreasing weight for ER to 1 lb since pt was having some discomfort with 2 lbs and therapist did not stabilize the second round and pt demonstrated improved scapular mobility and control; scapular mobs to L scapula in all directions Therapeutic Ex: On Dual [employer] using 3lbs: scapular retraction x 10 reps with pt returning therapist demo, L shoulder extension reactive isometrics x 10, L shoulder ER reactive isometrics x 10 Single arm pec stretch at doorway x 30 sec - pt returned demo Reactive isometrics with red theraband tied to doorknob: L shoulder ER x 10 reps, L shoulder extension x 10 reps  10/04/22 In R sidelying : ER x 10 reps with 1 lb with pt returning therapist demo; L shoulder flexion with therapist stabilizing shoulder to prevent compensation x 10; L shoulder abduction with therapist  stabilizing shoulder to prevent compensation x 10 reps; scapular mobs to L scapula in all directions In supine: shoulder flexion with dowel AAROM x 10 reps with pt returning therapist demo and v/c to keep scapular muscles engaged; L shoulder abduction with dowel x 10 reps at approx 20 deg from mat to prevent pain with v/c to decrease scapular compensation; horizontal abduction with red band x 10 reps with pt returning therapist demo In sitting: scapular retraction x 10 reps with red band and pt returning therapist demo  10/02/22 Remeasured circumferences and pt demonstrates a good decrease since last session and is now nearly at baseline. Assessed head and neck garment for fit. Therapist performed sequence as follows: short neck, 5 diaphragmatic breaths, bilateral axillary nodes, bilateral pectoral nodes, anterior chest, short neck, posterior neck moving fluid towards pathway aimed at lateral neck, then lateral and anterior neck moving fluid towards pathway aimed at lateral neck then retracing all steps.   09/29/2022 Pt measured with good results Therapist performed sequence as follows: short neck, 5 diaphragmatic breaths, bilateral axillary nodes, bilateral pectoral nodes, anterior chest, short neck, posterior neck moving fluid towards pathway aimed at lateral neck, then lateral and anterior neck moving fluid towards pathway aimed at lateral neck then retracing all steps. Discussed wearing chip pack at night, but pt is going to try and wear his night garment but without the straps around his head to see if it helps.  09/18/22- Therapist performed sequence as follows: short neck, 5 diaphragmatic breaths, bilateral axillary nodes, bilateral pectoral nodes, anterior chest, short neck, posterior neck moving fluid towards pathway aimed at lateral neck, then lateral and anterior neck moving fluid towards pathway aimed at lateral neck then retracing all steps. Educated pt to continue with scar mobilization and  to try and wear his garment overnight.   PATIENT EDUCATION:  Education details: anatomy and physiology of the lymphatic system, basic principles of self MLD, wear chip pack Person educated: Patient Education method: Explanation Education comprehension: verbalized understanding  HOME EXERCISE PROGRAM: Wear head and neck garment as much as possible  Access Code: [email or web address] URL: [email or web address] Date: 10/31/2022 Prepared by: Manus Gunning  Exercises - Sidelying Shoulder External Rotation with Dumbbell  - 1 x daily - 7 x weekly - 1-3 sets - 10 reps - Sidelying Shoulder Abduction Full Range of Motion  - 1 x daily - 7 x weekly - 1-3 sets - 10 reps - Sidelying Shoulder Flexion 15 Degrees  - 1 x daily - 7 x weekly - 1-3 sets - 10 reps - Supine Shoulder Horizontal Abduction  with Resistance  - 1 x daily - 7 x weekly - 1-3 sets - 10 reps - Seated Shoulder Row with Anchored Resistance  - 1 x daily - 7 x weekly - 1-3 sets - 10 reps - Supine Shoulder Abduction AAROM with Dowel  - 1 x daily - 7 x weekly - 1 sets - 10 reps - Supine Shoulder Flexion with Dowel AAROM - Palms Up  - 1 x daily - 7 x weekly - 1 sets - 10 reps - Single Arm Doorway Pec Stretch at 90 Degrees Abduction  - 1-2 x daily - 7 x weekly - 1 sets - 3 reps - 30 hold - Shoulder External Rotation Reactive Isometrics  - 1 x daily - 7 x weekly - 1 sets - 10 reps - Shoulder Extension Reactive Isometrics with Elbow Extended  - 1 x daily - 7 x weekly - 1 sets - 10 reps - Standing Shoulder Flexion Reactive Isometric  - 1 x daily - 7 x weekly - 1 sets - 10 reps - Shoulder External Rotation with Anchored Resistance  - 1 x daily - 7 x weekly - 1 sets - 10 reps - Single Arm Shoulder Extension with Anchored Resistance  - 1 x daily - 7 x weekly - 1 sets - 10 reps - Standing Bent Over Single Arm Shoulder Row  - 1 x daily - 7 x weekly - 1 sets - 10 reps - Shoulder Overhead Press in Abduction with Dumbbells  - 1 x daily - 7 x  weekly - 1 sets - 10 reps - Standing Shoulder Shrugs with Barbell  - 1 x daily - 7 x weekly - 1 sets - 10 reps  ASSESSMENT:  CLINICAL IMPRESSION: Pt's flexion and abduction ROM continue to improve. He has met most of his goals and has partially met his shoulder ROM goals but continues to demonstrate improving ROM every visit. He is ready for discharge at this time because he is independent in his home exercise program and goes to the gym regularly and continue the exercises there.   OBJECTIVE IMPAIRMENTS: decreased ROM, decreased strength, increased edema, increased fascial restrictions, impaired UE functional use, postural dysfunction, and pain.   ACTIVITY LIMITATIONS: carrying, lifting, and reach over head  PARTICIPATION LIMITATIONS: community activity  PERSONAL FACTORS:  none  are also affecting patient's functional outcome.   REHAB POTENTIAL: Good  CLINICAL DECISION MAKING: Stable/uncomplicated  EVALUATION COMPLEXITY: Low  GOALS: Goals reviewed with patient? Yes   LONG TERM GOALS: Target date: 11/30/2022    Pt will demonstrate a 2 cm decrease at edema 8 cm superior to sternal notch to decrease risk of infection. Baseline:  Goal status: MET Went from 46 to 43.3 10/04/22  2.  Pt will be independent in self MLD for long term management of edema.  Baseline:  Goal status: MET pt reports he is independent with this 10/04/22  3.  Pt will obtain appropriate compression garments for long term management of lymphedema.  Baseline:  Goal status: MET 10/04/22- got tribute night head and neck  4.  Pt will return to baseline cervical ROM to allow pt to return to PLOF.  Baseline:  Goal status: MET 10/04/22- cervical flexion no longer limited by edema  5. Pt will demonstrate 135 degrees of L shoulder flexion to allow her to reach overhead.  Goal status:Partially met 11/02/22- 125 pt improves ROM at every visit  6. Pt will demonstrate 130 degrees of L shoulder abduction to allow her  to reach out to the side.  Goal status:  Partially met -11/02/22  120 degrees - started at 64 and improves every visit  7. Pt will report he is able to reach out of his window at the drive through with no difficulty.  Goal status: Partially met - 11/02/22 pt reports this is improving   8. Pt will be independent in a home exercise program for long term stretching and strengthening.   Goal status: MET 11/02/22   PLAN:  PT FREQUENCY: 2x/week  PT DURATION: 4 weeks  PLANNED INTERVENTIONS: Therapeutic exercises, Therapeutic activity, Patient/Family education, Self Care, Joint mobilization, Orthotic/Fit training, Manual lymph drainage, Compression bandaging, scar mobilization, Vasopneumatic device, and Manual therapy  PLAN FOR NEXT SESSION: d/c    Manus Gunning, PT 11/02/2022, 10:33 AM  PHYSICAL THERAPY DISCHARGE SUMMARY  Visits from Start of Care: 13  Current functional level related to goals / functional outcomes: See above, lymphedema goals met, pt partially met shoulder ROM goals   Remaining deficits: Flexion and abduction goals not completely met but pt's ROM is improving every day   Education / Equipment: HEP, MLD, compression garments   Patient agrees to discharge. Patient goals were partially met. Patient is being discharged due to being pleased with the current functional level.  Allyson Sabal [place], [place] 11/02/22 10:34 AM

## 2022-11-07 ENCOUNTER — Ambulatory Visit: Payer: Medicare Other | Admitting: Physical Therapy

## 2022-11-16 ENCOUNTER — Encounter: Payer: Medicare Other | Admitting: Physical Therapy

## 2022-12-29 ENCOUNTER — Emergency Department (HOSPITAL_BASED_OUTPATIENT_CLINIC_OR_DEPARTMENT_OTHER): Payer: Medicare Other

## 2022-12-29 ENCOUNTER — Encounter (HOSPITAL_COMMUNITY)
Admission: EM | Disposition: A | Discharge status: Home / Self Care | Payer: Self-pay | Source: Home / Self Care | Attending: Internal Medicine

## 2022-12-29 ENCOUNTER — Encounter (HOSPITAL_BASED_OUTPATIENT_CLINIC_OR_DEPARTMENT_OTHER): Payer: Self-pay | Admitting: Emergency Medicine

## 2022-12-29 ENCOUNTER — Other Ambulatory Visit (HOSPITAL_BASED_OUTPATIENT_CLINIC_OR_DEPARTMENT_OTHER): Payer: Self-pay

## 2022-12-29 ENCOUNTER — Other Ambulatory Visit: Payer: Self-pay

## 2022-12-29 ENCOUNTER — Emergency Department (HOSPITAL_BASED_OUTPATIENT_CLINIC_OR_DEPARTMENT_OTHER): Payer: Medicare Other | Admitting: Radiology

## 2022-12-29 ENCOUNTER — Inpatient Hospital Stay (HOSPITAL_BASED_OUTPATIENT_CLINIC_OR_DEPARTMENT_OTHER)
Admission: EM | Admit: 2022-12-29 | Discharge: 2022-12-31 | DRG: 287 | Disposition: A | Discharge status: Home / Self Care | Payer: Medicare Other | Attending: Internal Medicine | Admitting: Internal Medicine

## 2022-12-29 DIAGNOSIS — R9431 Abnormal electrocardiogram [ECG] [EKG]: Secondary | ICD-10-CM

## 2022-12-29 DIAGNOSIS — I319 Disease of pericardium, unspecified: Secondary | ICD-10-CM | POA: Diagnosis present

## 2022-12-29 DIAGNOSIS — I77819 Aortic ectasia, unspecified site: Secondary | ICD-10-CM | POA: Diagnosis not present

## 2022-12-29 DIAGNOSIS — I959 Hypotension, unspecified: Secondary | ICD-10-CM | POA: Diagnosis present

## 2022-12-29 DIAGNOSIS — I455 Other specified heart block: Secondary | ICD-10-CM | POA: Diagnosis not present

## 2022-12-29 DIAGNOSIS — J449 Chronic obstructive pulmonary disease, unspecified: Secondary | ICD-10-CM | POA: Diagnosis present

## 2022-12-29 DIAGNOSIS — I10 Essential (primary) hypertension: Secondary | ICD-10-CM | POA: Diagnosis present

## 2022-12-29 DIAGNOSIS — E7800 Pure hypercholesterolemia, unspecified: Secondary | ICD-10-CM | POA: Diagnosis present

## 2022-12-29 DIAGNOSIS — Z85818 Personal history of malignant neoplasm of other sites of lip, oral cavity, and pharynx: Secondary | ICD-10-CM | POA: Diagnosis not present

## 2022-12-29 DIAGNOSIS — Z7982 Long term (current) use of aspirin: Secondary | ICD-10-CM | POA: Diagnosis not present

## 2022-12-29 DIAGNOSIS — R079 Chest pain, unspecified: Principal | ICD-10-CM

## 2022-12-29 DIAGNOSIS — I2510 Atherosclerotic heart disease of native coronary artery without angina pectoris: Secondary | ICD-10-CM

## 2022-12-29 DIAGNOSIS — I309 Acute pericarditis, unspecified: Secondary | ICD-10-CM | POA: Diagnosis present

## 2022-12-29 DIAGNOSIS — K219 Gastro-esophageal reflux disease without esophagitis: Secondary | ICD-10-CM | POA: Diagnosis present

## 2022-12-29 DIAGNOSIS — I300 Acute nonspecific idiopathic pericarditis: Secondary | ICD-10-CM

## 2022-12-29 DIAGNOSIS — I480 Paroxysmal atrial fibrillation: Secondary | ICD-10-CM | POA: Diagnosis present

## 2022-12-29 DIAGNOSIS — I25118 Atherosclerotic heart disease of native coronary artery with other forms of angina pectoris: Secondary | ICD-10-CM | POA: Diagnosis present

## 2022-12-29 DIAGNOSIS — Z79899 Other long term (current) drug therapy: Secondary | ICD-10-CM

## 2022-12-29 DIAGNOSIS — I77810 Thoracic aortic ectasia: Secondary | ICD-10-CM | POA: Diagnosis present

## 2022-12-29 DIAGNOSIS — D696 Thrombocytopenia, unspecified: Secondary | ICD-10-CM | POA: Diagnosis present

## 2022-12-29 DIAGNOSIS — R001 Bradycardia, unspecified: Secondary | ICD-10-CM | POA: Diagnosis present

## 2022-12-29 DIAGNOSIS — E7849 Other hyperlipidemia: Secondary | ICD-10-CM | POA: Diagnosis not present

## 2022-12-29 DIAGNOSIS — D649 Anemia, unspecified: Secondary | ICD-10-CM | POA: Diagnosis present

## 2022-12-29 HISTORY — DX: Anemia, unspecified: D64.9

## 2022-12-29 HISTORY — DX: Disease of pericardium, unspecified: I31.9

## 2022-12-29 HISTORY — DX: Atherosclerotic heart disease of native coronary artery without angina pectoris: I25.10

## 2022-12-29 HISTORY — PX: LEFT HEART CATH AND CORONARY ANGIOGRAPHY: CATH118249

## 2022-12-29 HISTORY — DX: Aortic ectasia, unspecified site: I77.819

## 2022-12-29 HISTORY — DX: Paroxysmal atrial fibrillation: I48.0

## 2022-12-29 HISTORY — DX: Other specified heart block: I45.5

## 2022-12-29 LAB — CBC WITH DIFFERENTIAL/PLATELET
Abs Immature Granulocytes: 0.01 10*3/uL (ref 0.00–0.07)
Basophils Absolute: 0 10*3/uL (ref 0.0–0.1)
Basophils Relative: 0 %
Eosinophils Absolute: 0 10*3/uL (ref 0.0–0.5)
Eosinophils Relative: 1 %
HCT: 40.5 % (ref 39.0–52.0)
Hemoglobin: 13.4 g/dL (ref 13.0–17.0)
Immature Granulocytes: 0 %
Lymphocytes Relative: 14 %
Lymphs Abs: 0.8 10*3/uL (ref 0.7–4.0)
MCH: 32.1 pg (ref 26.0–34.0)
MCHC: 33.1 g/dL (ref 30.0–36.0)
MCV: 97.1 fL (ref 80.0–100.0)
Monocytes Absolute: 0.5 10*3/uL (ref 0.1–1.0)
Monocytes Relative: 9 %
Neutro Abs: 4.3 10*3/uL (ref 1.7–7.7)
Neutrophils Relative %: 76 %
Platelets: 142 10*3/uL — ABNORMAL LOW (ref 150–400)
RBC: 4.17 MIL/uL — ABNORMAL LOW (ref 4.22–5.81)
RDW: 14.6 % (ref 11.5–15.5)
WBC: 5.6 10*3/uL (ref 4.0–10.5)
nRBC: 0 % (ref 0.0–0.2)

## 2022-12-29 LAB — BASIC METABOLIC PANEL
Anion gap: 7 (ref 5–15)
BUN: 19 mg/dL (ref 8–23)
CO2: 27 mmol/L (ref 22–32)
Calcium: 9.2 mg/dL (ref 8.9–10.3)
Chloride: 103 mmol/L (ref 98–111)
Creatinine, Ser: 0.97 mg/dL (ref 0.61–1.24)
GFR, Estimated: >60 mL/min (ref >60)
Glucose, Bld: 177 mg/dL — ABNORMAL HIGH (ref 70–99)
Potassium: 3.9 mmol/L (ref 3.5–5.1)
Sodium: 137 mmol/L (ref 135–145)

## 2022-12-29 LAB — D-DIMER, QUANTITATIVE: D-Dimer, Quant: 0.67 ug/mL-FEU — ABNORMAL HIGH (ref 0.00–0.50)

## 2022-12-29 LAB — TROPONIN I (HIGH SENSITIVITY)
Troponin I (High Sensitivity): 3 ng/L (ref <18)
Troponin I (High Sensitivity): 3 ng/L (ref <18)

## 2022-12-29 SURGERY — LEFT HEART CATH AND CORONARY ANGIOGRAPHY
Anesthesia: LOCAL

## 2022-12-29 MED ORDER — FENTANYL CITRATE (PF) 100 MCG/2ML IJ SOLN
INTRAMUSCULAR | Status: DC | PRN
  Administered 2022-12-29: 25 ug via INTRAVENOUS

## 2022-12-29 MED ORDER — ASPIRIN 81 MG PO CHEW: 81.0000 mg | CHEWABLE_TABLET | Freq: Every day | ORAL | Status: DC

## 2022-12-29 MED ORDER — NOREPINEPHRINE 4 MG/250ML-% IV SOLN
0.0000 ug/min | INTRAVENOUS | Status: DC
  Administered 2022-12-29: 10 ug/min via INTRAVENOUS
  Filled 2022-12-29 (×2): qty 250

## 2022-12-29 MED ORDER — COLCHICINE 0.6 MG PO TABS
0.6000 mg | ORAL_TABLET | Freq: Two times a day (BID) | ORAL | Status: DC
  Administered 2022-12-29 – 2022-12-31 (×4): 0.6 mg via ORAL
  Filled 2022-12-29 (×4): qty 1

## 2022-12-29 MED ORDER — FENTANYL CITRATE (PF) 100 MCG/2ML IJ SOLN
INTRAMUSCULAR | Status: AC
  Filled 2022-12-29: qty 2

## 2022-12-29 MED ORDER — IOHEXOL 350 MG/ML SOLN
INTRAVENOUS | Status: DC | PRN
  Administered 2022-12-29: 80 mL

## 2022-12-29 MED ORDER — SODIUM CHLORIDE 0.9 % IV BOLUS
1000.0000 mL | Freq: Once | INTRAVENOUS | Status: AC
  Administered 2022-12-29: 2000 mL via INTRAVENOUS

## 2022-12-29 MED ORDER — VERAPAMIL HCL 2.5 MG/ML IV SOLN
INTRAVENOUS | Status: DC | PRN
  Administered 2022-12-29: 10 mL via INTRA_ARTERIAL

## 2022-12-29 MED ORDER — LIDOCAINE HCL (PF) 1 % IJ SOLN
INTRAMUSCULAR | Status: DC | PRN
  Administered 2022-12-29: 2 mL via INTRADERMAL

## 2022-12-29 MED ORDER — LIDOCAINE HCL (PF) 1 % IJ SOLN
INTRAMUSCULAR | Status: AC
  Filled 2022-12-29: qty 30

## 2022-12-29 MED ORDER — SODIUM CHLORIDE 0.9 % IV SOLN: INTRAVENOUS | Status: AC

## 2022-12-29 MED ORDER — ACETAMINOPHEN 325 MG PO TABS: 650.0000 mg | ORAL_TABLET | ORAL | Status: DC | PRN

## 2022-12-29 MED ORDER — MIDAZOLAM HCL 2 MG/2ML IJ SOLN
INTRAMUSCULAR | Status: DC | PRN
  Administered 2022-12-29: 1 mg via INTRAVENOUS

## 2022-12-29 MED ORDER — HEPARIN (PORCINE) IN NACL 1000-0.9 UT/500ML-% IV SOLN
INTRAVENOUS | Status: AC
  Filled 2022-12-29: qty 1000

## 2022-12-29 MED ORDER — HEPARIN SODIUM (PORCINE) 1000 UNIT/ML IJ SOLN
INTRAMUSCULAR | Status: AC
  Filled 2022-12-29: qty 10

## 2022-12-29 MED ORDER — MIDAZOLAM HCL 2 MG/2ML IJ SOLN
INTRAMUSCULAR | Status: AC
  Filled 2022-12-29: qty 2

## 2022-12-29 MED ORDER — ONDANSETRON HCL 4 MG/2ML IJ SOLN: 4.0000 mg | Freq: Four times a day (QID) | INTRAMUSCULAR | Status: DC | PRN

## 2022-12-29 MED ORDER — NITROGLYCERIN 0.4 MG SL SUBL
0.4000 mg | SUBLINGUAL_TABLET | Freq: Once | SUBLINGUAL | Status: AC
  Administered 2022-12-29: 0.4 mg via SUBLINGUAL
  Filled 2022-12-29: qty 1

## 2022-12-29 MED ORDER — HEPARIN SODIUM (PORCINE) 1000 UNIT/ML IJ SOLN
INTRAMUSCULAR | Status: DC | PRN
  Administered 2022-12-29: 5000 [IU] via INTRAVENOUS

## 2022-12-29 MED ORDER — SODIUM CHLORIDE 0.9% FLUSH: 3.0000 mL | INTRAVENOUS | Status: DC | PRN

## 2022-12-29 MED ORDER — NOREPINEPHRINE BITARTRATE 1 MG/ML IV SOLN
INTRAVENOUS | Status: DC | PRN
  Administered 2022-12-29: 10 ug/min via INTRAVENOUS

## 2022-12-29 MED ORDER — SODIUM CHLORIDE 0.9 % IV SOLN: 250.0000 mL | INTRAVENOUS | Status: DC | PRN

## 2022-12-29 MED ORDER — HEPARIN BOLUS VIA INFUSION: 4000.0000 [IU] | Freq: Once | INTRAVENOUS | Status: DC

## 2022-12-29 MED ORDER — HEPARIN (PORCINE) 25000 UT/250ML-% IV SOLN
1000.0000 [IU]/h | INTRAVENOUS | Status: DC
  Administered 2022-12-29: 1000 [IU]/h via INTRAVENOUS
  Filled 2022-12-29: qty 250

## 2022-12-29 MED ORDER — ATORVASTATIN CALCIUM 10 MG PO TABS
20.0000 mg | ORAL_TABLET | Freq: Every day | ORAL | Status: DC
  Administered 2022-12-30 – 2022-12-31 (×2): 20 mg via ORAL
  Filled 2022-12-29 (×3): qty 2

## 2022-12-29 MED ORDER — HYDRALAZINE HCL 20 MG/ML IJ SOLN: 10.0000 mg | INTRAMUSCULAR | Status: AC | PRN

## 2022-12-29 MED ORDER — NITROGLYCERIN 1 MG/10 ML FOR IR/CATH LAB
INTRA_ARTERIAL | Status: AC
  Filled 2022-12-29: qty 10

## 2022-12-29 MED ORDER — HEPARIN SODIUM (PORCINE) 5000 UNIT/ML IJ SOLN
4000.0000 [IU] | Freq: Once | INTRAMUSCULAR | Status: AC
  Administered 2022-12-29: 4000 [IU] via INTRAVENOUS
  Filled 2022-12-29: qty 1

## 2022-12-29 MED ORDER — IBUPROFEN 600 MG PO TABS
800.0000 mg | ORAL_TABLET | Freq: Three times a day (TID) | ORAL | Status: DC
  Administered 2022-12-29 – 2022-12-31 (×6): 800 mg via ORAL
  Filled 2022-12-29 (×6): qty 1

## 2022-12-29 MED ORDER — ASPIRIN 81 MG PO CHEW
324.0000 mg | CHEWABLE_TABLET | Freq: Once | ORAL | Status: AC
  Administered 2022-12-29: 324 mg via ORAL

## 2022-12-29 MED ORDER — LABETALOL HCL 5 MG/ML IV SOLN: 10.0000 mg | INTRAVENOUS | Status: AC | PRN

## 2022-12-29 MED ORDER — VERAPAMIL HCL 2.5 MG/ML IV SOLN
INTRAVENOUS | Status: AC
  Filled 2022-12-29: qty 2

## 2022-12-29 MED ORDER — ONDANSETRON HCL 4 MG/2ML IJ SOLN
INTRAMUSCULAR | Status: AC
  Administered 2022-12-29: 4 mg
  Filled 2022-12-29: qty 2

## 2022-12-29 MED ORDER — ASPIRIN 81 MG PO CHEW
CHEWABLE_TABLET | ORAL | Status: AC
  Filled 2022-12-29: qty 4

## 2022-12-29 MED ORDER — SODIUM CHLORIDE 0.9% FLUSH
3.0000 mL | Freq: Two times a day (BID) | INTRAVENOUS | Status: DC
  Administered 2022-12-30: 3 mL via INTRAVENOUS

## 2022-12-29 MED ORDER — HEPARIN (PORCINE) IN NACL 1000-0.9 UT/500ML-% IV SOLN
INTRAVENOUS | Status: DC | PRN
  Administered 2022-12-29 (×2): 500 mL

## 2022-12-29 SURGICAL SUPPLY — 13 items
CATH DIAG 6FR PIGTAIL (CATHETERS) IMPLANT
CATH INFINITI 6F FL3.5 (CATHETERS) IMPLANT
CATH VISTA GUIDE 6FR JR4 (CATHETERS) IMPLANT
DEVICE RAD COMP TR BAND LRG (VASCULAR PRODUCTS) IMPLANT
GLIDESHEATH SLEND SS 6F .021 (SHEATH) IMPLANT
KIT ENCORE 26 ADVANTAGE (KITS) IMPLANT
KIT HEART LEFT (KITS) ×2 IMPLANT
PACK CARDIAC CATHETERIZATION (CUSTOM PROCEDURE TRAY) ×2 IMPLANT
SYR MEDRAD MARK 7 150ML (SYRINGE) ×2 IMPLANT
TRANSDUCER W/STOPCOCK (MISCELLANEOUS) ×2 IMPLANT
TUBING CIL FLEX 10 FLL-RA (TUBING) ×2 IMPLANT
TUBING CONTRAST HIGH PRESS 48 (TUBING) IMPLANT
WIRE EMERALD 3MM-J .035X260CM (WIRE) IMPLANT

## 2022-12-29 NOTE — Progress Notes (Signed)
ANTICOAGULATION CONSULT NOTE - Initial Consult  Pharmacy Consult for heparin Indication: chest pain/ACS  No Known Allergies  Patient Measurements: Height: 5' 8"$  (172.7 cm) Weight: 79.4 kg (175 lb) IBW/kg (Calculated) : 68.4 Heparin Dosing Weight: 79.4kg  Vital Signs: Temp: 97.8 F (36.6 C) (02/09 1139) Temp Source: Oral (02/09 1139) BP: 112/52 (02/09 1300) Pulse Rate: 56 (02/09 1300)  Labs: Recent Labs    12/29/22 1143  HGB 13.4  HCT 40.5  PLT 142*  CREATININE 0.97  TROPONINIHS 3    Estimated Creatinine Clearance: 66.6 mL/min (by C-G formula based on SCr of 0.97 mg/dL).   Medical History: Past Medical History:  Diagnosis Date   Allergy    Bronchitis    chronic   Cancer ([facility])    Constipation    COPD (chronic obstructive pulmonary disease) ([facility])    Dyspnea    Hypercholesteremia    Hypertension     Medications:  Infusions:   heparin 1,000 Units/hr (12/29/22 1356)   norepinephrine (LEVOPHED) Adult infusion 10 mcg/min (12/29/22 1355)   sodium chloride      Assessment: 3 yom presented to the ED with CP. To start IV heparin for possible STEMI. Baseline Hgb is WNL. Platelets are slightly low. He is not on anticoagulation PTA.   Goal of Therapy:  Heparin level 0.3-0.7 units/ml Monitor platelets by anticoagulation protocol: Yes   Plan:  Heparin bolus 4000 units IV x 1 Heparin gtt 1000 units/hr F/u cath plans or need to order heparin labs  Analisa Sledd, Rande Lawman 12/29/2022,1:55 PM

## 2022-12-29 NOTE — H&P (Addendum)
Cardiology Admission History and Physical   Patient ID: Brad [name] MRN: [id number]; DOB: Mar 24, 1951   Admission date: 12/29/2022  PCP:  Lujean Amel, [facility] Cardiologist:  None     Chief Complaint:  STEMI  Patient Profile:   Brad [name] is a 72 y.o. male with HTN, HLD, COPD, squamous cell carcinoma of Head/neck Ca who is being seen 12/29/2022 for the evaluation of STEMI.  History of Present Illness:   Brad [name] is a 72 yo male with PMH noted above. Reports being in his usual state of health until yesterday. Had an episode of centralized indigestion which he treated with antiacid with improvement.   Presented to [facility] with complaints of centralized chest heaviness/tightness that started this morning when he woke up from sleep.  States it was the same sensation he had the day prior but did not improve with antacid.  In the ED his labs showed sodium 137, potassium 3.9, creatinine 0.9, high-sensitivity troponin 3, WBC 5.6, hemoglobin 13.4, D-dimer 0.67.  Chest x-ray negative. Initial EKG (11:37 AM) showed sinus rhythm, 76 bpm, slight elevation in lead II.  Repeat EKG (11:45 AM) sinus rhythm with slightly increased ST elevation in lead II with diffuse ST elevation suspicious for pericarditis.  He was given 324 of aspirin and 1 sublingual nitroglycerin.  Subsequently developed hypotension and bradycardia with repeat EKG showing more dramatic changes in inferior leads.  Therefore code STEMI was called.  Patient was given IV heparin and placed on Levophed with improvement in BP.  Transferred to [facility] emergently for cardiac catheterization.   Past Medical History:  Diagnosis Date   Allergy    Bronchitis    chronic   Cancer (HCC)    Constipation    COPD (chronic obstructive pulmonary disease) ([place])    Dyspnea    Hypercholesteremia    Hypertension     Past Surgical History:  Procedure Laterality Date   COLONOSCOPY     FRACTURE  SURGERY     right wrist   LARYNGOSCOPY AND ESOPHAGOSCOPY N/A 03/03/2022   Procedure: DIRECT LARYNGOSCOPY AND ESOPHAGOSCOPY BIOPSIES AND FROZEN SECTIONS;  Surgeon: Izora Gala, MD;  Location: [facility];  Service: ENT;  Laterality: N/A;   Lipo Suction     NASOPHARYNGOSCOPY N/A 03/03/2022   Procedure: NASOPHARYNGOSCOPY;  Surgeon: Izora Gala, MD;  Location: [facility];  Service: ENT;  Laterality: N/A;   POLYPECTOMY     RADICAL NECK DISSECTION Left 03/03/2022   Procedure: NECK DISSECTION;  Surgeon: Izora Gala, MD;  Location: [facility];  Service: ENT;  Laterality: Left;     Medications Prior to Admission: Prior to Admission medications   Medication Sig Start Date End Date Taking? Authorizing Provider  albuterol (PROVENTIL HFA;VENTOLIN HFA) 108 (90 BASE) MCG/ACT inhaler Inhale 2 puffs into the lungs every 6 (six) hours as needed. shortness of breath    [provider]  aspirin 81 MG chewable tablet Chew 81 mg by mouth in the morning.    [provider]  Budeson-Glycopyrrol-Formoterol (BREZTRI AEROSPHERE) 160-9-4.8 MCG/ACT AERO Inhale 2 puffs into the lungs in the morning and at bedtime.    [provider]  Cholecalciferol (VITAMIN D3 PO) Take 1 tablet by mouth in the morning.    [provider]  Cyanocobalamin (VITAMIN B-12 PO) Take 1 tablet by mouth in the morning.    [provider]  HYDROcodone-acetaminophen (NORCO) 7.5-325 MG tablet Take 1 tablet by mouth every 6 (six) hours  as needed for moderate pain. 03/04/22   Izora Gala, MD  lidocaine (XYLOCAINE) 2 % solution Patient: Mix 1part 2% viscous lidocaine, 1part H20. Swish & swallow 62m of diluted mixture, 360m before meals and at bedtime, up to QID 04/24/22   SqEppie GibsonMD  Multiple Vitamin (MULTIVITAMIN WITH MINERALS) TABS Take 1 tablet by mouth in the morning.    [provider]  nicotine (NICODERM CQ - DOSED IN MG/24 HOURS) 14 mg/24hr patch Place 1 patch (14 mg total) onto the skin daily. Apply 21  mg patch daily x 6 wk, then 1467match daily x 2 wk, then 7 mg patch daily x 2 wk 04/18/22   SquEppie GibsonD  nicotine (NICODERM CQ - DOSED IN MG/24 HOURS) 21 mg/24hr patch Place 1 patch (21 mg total) onto the skin daily. Apply 21 mg patch daily x 6 wk, then 73m34mtch daily x 2 wk, then 7 mg patch daily x 2 wk 04/18/22   SquiEppie Gibson  nicotine (NICODERM CQ - DOSED IN MG/24 HR) 7 mg/24hr patch Place 1 patch (7 mg total) onto the skin daily. Apply 21 mg patch daily x 6 wk, then 73mg35mch daily x 2 wk, then 7 mg patch daily x 2 wk 04/18/22   SquirEppie Gibson ondansetron (ZOFRAN-ODT) 8 MG disintegrating tablet Take 1 tablet (8 mg total) by mouth every 8 (eight) hours as needed for nausea or vomiting. 03/04/22   RosenIzora Gala rosuvastatin (CRESTOR) 20 MG tablet Take 20 mg by mouth in the morning. 02/14/12   [provider]  sildenafil (VIAGRA) 100 MG tablet Take 100 mg by mouth daily as needed for erectile dysfunction.    [provider]  tamsulosin (FLOMAX) 0.4 MG CAPS capsule Take 0.4 mg by mouth in the morning.    [provider]  Testosterone 20.25 MG/ACT (1.62%) GEL Apply 1 Pump topically in the morning. 12/22/21   [provider]  vitamin E 400 UNIT capsule Take 400 Units by mouth in the morning.    [provider]     Allergies:   No Known Allergies  Social History:   Social History   Socioeconomic History   Marital status: Married    Spouse name: Not on file   Number of children: Not on file   Years of education: Not on file   Highest education level: Not on file  Occupational History   Not on file  Tobacco Use   Smoking status: Light Smoker    Years: 30.00    Types: Cigarettes   Smokeless tobacco: Never   Tobacco comments:    smokes less than a pack a week  Vaping Use   Vaping Use: Never used  Substance and Sexual Activity   Alcohol use: Yes    Alcohol/week: 7.0 standard drinks of alcohol    Types: 7 Shots of liquor per week     Comment: weekly    Drug use: No   Sexual activity: Not Currently  Other Topics Concern   Not on file  Social History Narrative   Not on file   Social Determinants of Health   Financial Resource Strain: Low Risk  (04/14/2022)   Overall Financial Resource Strain (CARDIA)    Difficulty of Paying Living Expenses: Not hard at all  Food Insecurity: No Food Insecurity (04/14/2022)   Hunger Vital Sign    Worried About Running Out of Food in the Last Year: Never true    Ran Out  of Food in the Last Year: Never true  Transportation Needs: No Transportation Needs (04/14/2022)   PRAPARE - Hydrologist (Medical): No    Lack of Transportation (Non-Medical): No  Physical Activity: Not on file  Stress: Not on file  Social Connections: Not on file  Intimate Partner Violence: Not on file    Family History:   The patient's family history includes Cancer in his brother. There is no history of Colon cancer, Esophageal cancer, Rectal cancer, or Stomach cancer.    ROS:  Please see the history of present illness.  All other ROS reviewed and negative.     Physical Exam/Data:   Vitals:   12/29/22 1147 12/29/22 1200 12/29/22 1230 12/29/22 1300  BP:  119/64 (!) 115/49 (!) 112/52  Pulse:  65 70 (!) 56  Resp:  20 (!) 21 19  Temp:      TempSrc:      SpO2: 100% 99% 100% 99%  Weight:      Height:       No intake or output data in the 24 hours ending 12/29/22 1348    12/29/2022   11:40 AM 08/29/2022    2:17 PM 06/06/2022    2:48 PM  Last 3 Weights  Weight (lbs) 175 lb 171 lb 8 oz 182 lb 6.4 oz  Weight (kg) 79.379 kg 77.792 kg 82.736 kg     Body mass index is 26.61 kg/m.  General: Uncomfortable, mildly diaphoretic HEENT: normal Neck: no JVD Vascular: No carotid bruits; Distal pulses 2+ bilaterally   Cardiac:  normal S1, S2; RRR; no murmur  Lungs:  clear to auscultation bilaterally, no wheezing, rhonchi or rales  Abd: soft, nontender, no hepatomegaly  Ext: no  edema Musculoskeletal:  No deformities, BUE and BLE strength normal and equal Skin: warm and dry  Neuro:  CNs 2-12 intact, no focal abnormalities noted Psych:  Normal affect    EKG:    Initial EKG (11:37 AM) showed sinus rhythm, 76 bpm, slight elevation in lead II.    EKG (11:45 AM) sinus rhythm with slightly increased ST elevation in lead II with diffuse ST elevation suspicious for pericarditis.   EKG ( 13:28)  showing more dramatic changes in inferior leads  Relevant CV Studies:  N/a   Laboratory Data:  High Sensitivity Troponin:   Recent Labs  Lab 12/29/22 1143  TROPONINIHS 3      Chemistry Recent Labs  Lab 12/29/22 1143  NA 137  K 3.9  CL 103  CO2 27  GLUCOSE 177*  BUN 19  CREATININE 0.97  CALCIUM 9.2  GFRNONAA >60  ANIONGAP 7    No results for input(s): "PROT", "ALBUMIN", "AST", "ALT", "ALKPHOS", "BILITOT" in the last 168 hours. Lipids No results for input(s): "CHOL", "TRIG", "HDL", "LABVLDL", "LDLCALC", "CHOLHDL" in the last 168 hours. Hematology Recent Labs  Lab 12/29/22 1143  WBC 5.6  RBC 4.17*  HGB 13.4  HCT 40.5  MCV 97.1  MCH 32.1  MCHC 33.1  RDW 14.6  PLT 142*   Thyroid No results for input(s): "TSH", "FREET4" in the last 168 hours. BNPNo results for input(s): "BNP", "PROBNP" in the last 168 hours.  DDimer  Recent Labs  Lab 12/29/22 1143  DDIMER 0.67*     Radiology/Studies:  DG Chest 2 View  Result Date: 12/29/2022 CLINICAL DATA:  Chest pain, chest tightness EXAM: CHEST - 2 VIEW COMPARISON:  [id number] FINDINGS: Left basilar scarring. No focal consolidation. No pleural effusion or pneumothorax.  Heart and mediastinal contours are unremarkable. No acute osseous abnormality. IMPRESSION: No active cardiopulmonary disease. Electronically Signed   By: Kathreen Devoid M.D.   On: 12/29/2022 12:14     Assessment and Plan:   Brad [name] is a 72 y.o. male with HTN, HLD, COPD who is being seen 12/29/2022 for the evaluation of STEMI.  STEMI --  presented to the ED with 2 episodes of chest pain, first 1 the day prior second 1 in the morning of presentation which was not relieved with antacids.  Initial EKG with slight ST elevation in lead II with subsequent evolving changes concerning for STEMI.  Given aspirin, sublingual nitroglycerin and IV heparin.  Transferred to the cardiac Cath Lab emergently for cardiac catheterization. -- Further recommendations post cath -- Check echocardiogram  HTN -- Does not appear that he was on any antihypertensives prior to admission -- Currently requiring Levophed on arrival, follow BP  HLD -- Continue statin  Severity of Illness: The appropriate patient status for this patient is INPATIENT. Inpatient status is judged to be reasonable and necessary in order to provide the required intensity of service to ensure the patient's safety. The patient's presenting symptoms, physical exam findings, and initial radiographic and laboratory data in the context of their chronic comorbidities is felt to place them at high risk for further clinical deterioration. Furthermore, it is not anticipated that the patient will be medically stable for discharge from the hospital within 2 midnights of admission.   * I certify that at the point of admission it is my clinical judgment that the patient will require inpatient hospital care spanning beyond 2 midnights from the point of admission due to high intensity of service, high risk for further deterioration and high frequency of surveillance required.*   For questions or updates, please contact [facility] Please consult [email or web address] for contact info under     Signed, Reino Bellis, NP  12/29/2022 1:48 PM    ATTENDING ATTESTATION:  After conducting a review of all available clinical information with the care team, interviewing the patient, and performing a physical exam, I agree with the findings and plan described in this note.   GEN: No acute distress.    HEENT:  MMM, no JVD, no scleral icterus Cardiac: RRR, no murmurs, rubs, or gallops.  Respiratory: Clear to auscultation bilaterally. GI: Soft, nontender, non-distended  MS: No edema; No deformity. Neuro:  Nonfocal  Vasc:  +2 radial pulses; warm and well perfused  The patient is a 72 year old male with a history of squamous cell carcinoma receiving radiation, COPD, hypertension, and hyperlipidemia who presented to an outside hospital with acute onset chest pain starting earlier today. His EKG suggested pericarditis however repeat EKGs were more concerning for an inferior ST elevation myocardial infarction. He became hypotensive and required norepinephrine infusion. Due to ongoing chest pain and a diagnostic dilemma he was referred for definitive coronary angiography.  Further recommendations will be informed by the results of this test.  Lenna Sciara, MD Pager [phone]

## 2022-12-29 NOTE — ED Triage Notes (Signed)
Pt arrived POV, caox4, ambulatory, NAD. Pt c/o chest tightness, first occurred upon waking yesterday which improved after taking antiacids. Pt states when he woke this morning he had the same chest tightness with pain in the jaw as well, took same antiacids however no improvement this time. Pt denies SOB, N/V, dizziness, diaphoresis, or any other associated s/s at present or since onset.

## 2022-12-29 NOTE — Progress Notes (Signed)
Cards on-call paged d/t pt HR continuing to drop from 70s (baseline) to into the 30s. Lowest HR has dropped was 26 but then immediately jumps back to baseline in the 70s. Pt is asymptomatic during these drops. Awaiting call back. Passed on the message to night shift RN- RN aware and has the phone with the callback number given to on-call MD.

## 2022-12-29 NOTE — ED Provider Notes (Addendum)
[facility] Provider Note   CSN: [id number] Arrival date & time: 12/29/22  1131     History  Chief Complaint  Patient presents with   Chest Pain    Brad [name] is a 72 y.o. male.  Patient with chest pain last night and this morning.  Continuous for the last few hours.  Got better after some acid reflux medicine yesterday but came back again this morning worse.  History of COPD, hypertension, high cholesterol, oropharyngeal cancer.  Has had radiation treatments in the past.  Nothing makes the pain worse or better.  He was able to actually bike ride a little bit last night without a lot of discomfort.  He has had exertional pain.  No major cardiac history in the family.  Denies any recent illness.  No fever or chills.  No cough or sputum production.  No history of blood clots.  No recent travel.  The history is provided by the patient.       Home Medications Prior to Admission medications   Medication Sig Start Date End Date Taking? Authorizing Provider  albuterol (PROVENTIL HFA;VENTOLIN HFA) 108 (90 BASE) MCG/ACT inhaler Inhale 2 puffs into the lungs every 6 (six) hours as needed. shortness of breath    [provider]  aspirin 81 MG chewable tablet Chew 81 mg by mouth in the morning.    [provider]  Budeson-Glycopyrrol-Formoterol (BREZTRI AEROSPHERE) 160-9-4.8 MCG/ACT AERO Inhale 2 puffs into the lungs in the morning and at bedtime.    [provider]  Cholecalciferol (VITAMIN D3 PO) Take 1 tablet by mouth in the morning.    [provider]  Cyanocobalamin (VITAMIN B-12 PO) Take 1 tablet by mouth in the morning.    [provider]  HYDROcodone-acetaminophen (NORCO) 7.5-325 MG tablet Take 1 tablet by mouth every 6 (six) hours as needed for moderate pain. 03/04/22   Izora Gala, MD  lidocaine (XYLOCAINE) 2 % solution Patient: Mix 1part 2% viscous lidocaine, 1part H20. Swish & swallow 16m of  diluted mixture, 381m before meals and at bedtime, up to QID 04/24/22   SqEppie GibsonMD  Multiple Vitamin (MULTIVITAMIN WITH MINERALS) TABS Take 1 tablet by mouth in the morning.    [provider]  nicotine (NICODERM CQ - DOSED IN MG/24 HOURS) 14 mg/24hr patch Place 1 patch (14 mg total) onto the skin daily. Apply 21 mg patch daily x 6 wk, then 1479match daily x 2 wk, then 7 mg patch daily x 2 wk 04/18/22   SquEppie GibsonD  nicotine (NICODERM CQ - DOSED IN MG/24 HOURS) 21 mg/24hr patch Place 1 patch (21 mg total) onto the skin daily. Apply 21 mg patch daily x 6 wk, then 70m72mtch daily x 2 wk, then 7 mg patch daily x 2 wk 04/18/22   SquiEppie Gibson  nicotine (NICODERM CQ - DOSED IN MG/24 HR) 7 mg/24hr patch Place 1 patch (7 mg total) onto the skin daily. Apply 21 mg patch daily x 6 wk, then 70mg65mch daily x 2 wk, then 7 mg patch daily x 2 wk 04/18/22   SquirEppie Gibson ondansetron (ZOFRAN-ODT) 8 MG disintegrating tablet Take 1 tablet (8 mg total) by mouth every 8 (eight) hours as needed for nausea or vomiting. 03/04/22   RosenIzora Gala rosuvastatin (CRESTOR) 20 MG tablet Take 20 mg by mouth in the morning. 02/14/12   [provider]  sildenafil (VIAGRA) 100  MG tablet Take 100 mg by mouth daily as needed for erectile dysfunction.    [provider]  tamsulosin (FLOMAX) 0.4 MG CAPS capsule Take 0.4 mg by mouth in the morning.    [provider]  Testosterone 20.25 MG/ACT (1.62%) GEL Apply 1 Pump topically in the morning. 12/22/21   [provider]  vitamin E 400 UNIT capsule Take 400 Units by mouth in the morning.    [provider]      Allergies    Patient has no known allergies.    Review of Systems   Review of Systems  Physical Exam Updated Vital Signs BP (!) 112/52   Pulse (!) 56   Temp 97.8 F (36.6 C) (Oral)   Resp 19   Ht 5' 8"$  (1.727 m)   Wt 79.4 kg   SpO2 99%   BMI 26.61 kg/m  Physical Exam Vitals and nursing note  reviewed.  Constitutional:      General: He is not in acute distress.    Appearance: He is well-developed. He is not ill-appearing.  HENT:     Head: Normocephalic and atraumatic.  Eyes:     Extraocular Movements: Extraocular movements intact.     Conjunctiva/sclera: Conjunctivae normal.     Pupils: Pupils are equal, round, and reactive to light.  Cardiovascular:     Rate and Rhythm: Normal rate and regular rhythm.     Pulses:          Radial pulses are 2+ on the right side and 2+ on the left side.     Heart sounds: Normal heart sounds. No murmur heard. Pulmonary:     Effort: Pulmonary effort is normal. No respiratory distress.     Breath sounds: Normal breath sounds. No decreased breath sounds or wheezing.  Abdominal:     Palpations: Abdomen is soft.     Tenderness: There is no abdominal tenderness.  Musculoskeletal:        General: No swelling.     Cervical back: Normal range of motion and neck supple.  Skin:    General: Skin is warm and dry.     Capillary Refill: Capillary refill takes less than 2 seconds.  Neurological:     Mental Status: He is alert.  Psychiatric:        Mood and Affect: Mood normal.     ED Results / Procedures / Treatments   Labs (all labs ordered are listed, but only abnormal results are displayed) Labs Reviewed  CBC WITH DIFFERENTIAL/PLATELET - Abnormal; Notable for the following components:      Result Value   RBC 4.17 (*)    Platelets 142 (*)    All other components within normal limits  BASIC METABOLIC PANEL - Abnormal; Notable for the following components:   Glucose, Bld 177 (*)    All other components within normal limits  D-DIMER, QUANTITATIVE - Abnormal; Notable for the following components:   D-Dimer, Quant 0.67 (*)    All other components within normal limits  TROPONIN I (HIGH SENSITIVITY)  TROPONIN I (HIGH SENSITIVITY)    EKG EKG Interpretation  Date/Time:  Friday December 29 2022 11:45:10 EST Ventricular Rate:  74 PR  Interval:  171 QRS Duration: 92 QT Interval:  378 QTC Calculation: 420 R Axis:   54 Text Interpretation: Sinus arrhythmia Low voltage, precordial leads Confirmed by Lennice Sites ([id number]) on 12/29/2022 11:52:08 AM  Radiology DG Chest 2 View  Result Date: 12/29/2022 CLINICAL DATA:  Chest pain, chest tightness  EXAM: CHEST - 2 VIEW COMPARISON:  [id number] FINDINGS: Left basilar scarring. No focal consolidation. No pleural effusion or pneumothorax. Heart and mediastinal contours are unremarkable. No acute osseous abnormality. IMPRESSION: No active cardiopulmonary disease. Electronically Signed   By: Kathreen Devoid M.D.   On: 12/29/2022 12:14    Procedures .Critical Care  Performed by: Lennice Sites, DO Authorized by: Lennice Sites, DO   Critical care provider statement:    Critical care time (minutes):  40   Critical care was necessary to treat or prevent imminent or life-threatening deterioration of the following conditions:  Cardiac failure   Critical care was time spent personally by me on the following activities:  Blood draw for specimens, development of treatment plan with patient or surrogate, discussions with primary provider, examination of patient, evaluation of patient's response to treatment, obtaining history from patient or surrogate, ordering and performing treatments and interventions, ordering and review of laboratory studies, ordering and review of radiographic studies, pulse oximetry, re-evaluation of patient's condition and review of old charts   Care discussed with: admitting provider       Medications Ordered in ED Medications  heparin injection 4,000 Units (has no administration in time range)  sodium chloride 0.9 % bolus 1,000 mL (has no administration in time range)  norepinephrine (LEVOPHED) 51m in 2574m(0.016 mg/mL) premix infusion (has no administration in time range)  nitroGLYCERIN (NITROSTAT) SL tablet 0.4 mg (0.4 mg Sublingual Given 12/29/22 1228)    ED Course/  Medical Decision Making/ A&P                             Medical Decision Making Amount and/or Complexity of Data Reviewed Labs: ordered.  Risk Prescription drug management. Decision regarding hospitalization.   Brad [name] here with chest pain.  Normal vitals.  No fever.  Comorbidities of high blood pressure, high cholesterol, smoking, COPD.  Differential diagnosis is ACS versus pericarditis versus PE versus infectious process.  CBC, BMP, D-dimer, troponin, chest x-ray, EKG obtained.  Per my review and interpretation of EKG there is elevation in lead II of the ST segment but appears to have some PR depression as well throughout.  Findings could be pericarditis.  Does not appear to be ischemic.  Talked with Dr. TuRadford Paxith cardiology and overall does not think that this meets STEMI criteria.  Could be pericarditis.  Clinically it sounds like he is having some pain when he takes deep breath or when he leans forward.  Does have some aspects of pericarditis.  He does have multiple cardiac risk factors.  Heart score is 4.  Troponin initially is within normal limits.  D-dimer is age-adjusted normal.  Doubt PE.  Chest x-ray per my review interpretation shows no evidence of pneumonia or pneumothorax.  Overall lab work per my review and interpretation is unremarkable.  No significant anemia or electrolyte abnormality.  After talking with Dr. TuRadford Paxecommends admission for further workup.  Plan is for echocardiogram, trending of troponins and can consult cardiology after echocardiogram.  My suspicion that this could be pericarditis but feel likely need to rule out ACS as well.  Prior to patient being admitted to hospitalist service he got lightheaded and hypotensive and nauseous.  Repeat EKGs showed ST depressions diffusely but maybe a little bit more dramatic in inferior leads..  Now concern for ST elevation MI a little bit more convincingly.  Talked with Dr. CoBurt Knackith interventional cardiology who  after  discussion thinks that could be pericarditis but given things that are going on we should activate a code STEMI give the patient heparin, IV fluids and IV Levophed to get him over for heart catheterization.  Repeat EKGs continued to show ST elevation diffusely but seems to be more prominent in the inferior leads.   This chart was dictated using voice recognition software.  Despite best efforts to proofread,  errors can occur which can change the documentation meaning.         Final Clinical Impression(s) / ED Diagnoses Final diagnoses:  Chest pain, unspecified type  ST elevation    Rx / DC Orders ED Discharge Orders     None         Lennice Sites, DO 12/29/22 [place], Athalene Kolle, DO 12/29/22 1350

## 2022-12-29 NOTE — ED Notes (Addendum)
Pt alert, NAD, calm, interactive, resps e/u, skin W&D, starting to "feel better" as BP improves. IVF, heparin, levo infusing. ASA and heparin bolus IVP given. Dr. Ronnald Nian and Pharm D at [facility]. Out with EMS and ED RN. VSS, improving.

## 2022-12-30 ENCOUNTER — Inpatient Hospital Stay (HOSPITAL_COMMUNITY): Payer: Medicare Other

## 2022-12-30 DIAGNOSIS — R079 Chest pain, unspecified: Secondary | ICD-10-CM | POA: Diagnosis not present

## 2022-12-30 DIAGNOSIS — E7849 Other hyperlipidemia: Secondary | ICD-10-CM | POA: Diagnosis not present

## 2022-12-30 DIAGNOSIS — I480 Paroxysmal atrial fibrillation: Secondary | ICD-10-CM

## 2022-12-30 DIAGNOSIS — I455 Other specified heart block: Secondary | ICD-10-CM | POA: Diagnosis not present

## 2022-12-30 DIAGNOSIS — I300 Acute nonspecific idiopathic pericarditis: Secondary | ICD-10-CM | POA: Diagnosis not present

## 2022-12-30 LAB — ECHOCARDIOGRAM COMPLETE
Area-P 1/2: 4.39 cm2
Height: 68 in
S' Lateral: 3.2 cm
Weight: 2800 oz

## 2022-12-30 MED ORDER — PANTOPRAZOLE SODIUM 40 MG PO TBEC
40.0000 mg | DELAYED_RELEASE_TABLET | Freq: Every day | ORAL | Status: DC
  Administered 2022-12-30 – 2022-12-31 (×2): 40 mg via ORAL
  Filled 2022-12-30 (×2): qty 1

## 2022-12-30 MED ORDER — ALBUTEROL SULFATE (2.5 MG/3ML) 0.083% IN NEBU
3.0000 mL | INHALATION_SOLUTION | RESPIRATORY_TRACT | Status: DC | PRN
  Administered 2022-12-30: 3 mL via RESPIRATORY_TRACT
  Filled 2022-12-30: qty 3

## 2022-12-30 MED ORDER — APIXABAN 5 MG PO TABS
5.0000 mg | ORAL_TABLET | Freq: Two times a day (BID) | ORAL | Status: DC
  Administered 2022-12-30 – 2022-12-31 (×3): 5 mg via ORAL
  Filled 2022-12-30 (×3): qty 1

## 2022-12-30 MED ORDER — PERFLUTREN LIPID MICROSPHERE
1.0000 mL | INTRAVENOUS | Status: AC | PRN
  Administered 2022-12-30: 6 mL via INTRAVENOUS

## 2022-12-30 MED ORDER — AMIODARONE HCL IN DEXTROSE 360-4.14 MG/200ML-% IV SOLN
30.0000 mg/h | INTRAVENOUS | Status: DC
  Administered 2022-12-31: 30 mg/h via INTRAVENOUS
  Filled 2022-12-30: qty 200

## 2022-12-30 MED ORDER — AMIODARONE LOAD VIA INFUSION
150.0000 mg | Freq: Once | INTRAVENOUS | Status: AC
  Administered 2022-12-30: 150 mg via INTRAVENOUS
  Filled 2022-12-30: qty 83.34

## 2022-12-30 MED ORDER — HEPARIN (PORCINE) 25000 UT/250ML-% IV SOLN
1200.0000 [IU]/h | INTRAVENOUS | Status: DC
  Administered 2022-12-30: 1200 [IU]/h via INTRAVENOUS
  Filled 2022-12-30: qty 250

## 2022-12-30 MED ORDER — AMIODARONE HCL IN DEXTROSE 360-4.14 MG/200ML-% IV SOLN
60.0000 mg/h | INTRAVENOUS | Status: AC
  Administered 2022-12-30 (×2): 60 mg/h via INTRAVENOUS
  Filled 2022-12-30 (×2): qty 200

## 2022-12-30 NOTE — Progress Notes (Signed)
Progress Note  Patient Name: Brad [name] Date of Encounter: 12/30/2022  Primary Cardiologist: None  Subjective   Patient underwent LHC on 12/29/2022 due to STEMI, showed mild nonobstructive CAD. He is currently being treated for acute pericarditis.  Patient had bradycardia and pauses ranging between 2 and 3 seconds yesterday evening.  Overnight around 2:27 AM, he went into A-fib with RVR, heart rates between 110 and 130s. He denies any symptoms of palpitations, SOB, chest pain. Patient states that the first time he ever had atrial fibrillation.   Inpatient Medications    Scheduled Meds:  amiodarone  150 mg Intravenous Once   atorvastatin  20 mg Oral Daily   colchicine  0.6 mg Oral BID   ibuprofen  800 mg Oral TID   sodium chloride flush  3 mL Intravenous Q12H   Continuous Infusions:  sodium chloride     amiodarone     Followed by   amiodarone     heparin 1,200 Units/hr (12/30/22 0500)   PRN Meds: sodium chloride, acetaminophen, albuterol, ondansetron (ZOFRAN) IV, sodium chloride flush   Vital Signs    Vitals:   12/30/22 0329 12/30/22 0410 12/30/22 0504 12/30/22 0745  BP: (!) 104/58 106/65  100/74  Pulse:      Resp: 16 (!) 21 15 18  $ Temp: 98.1 F (36.7 C) 98.1 F (36.7 C)  98.1 F (36.7 C)  TempSrc: Oral Oral  Oral  SpO2: 95% 95% 94% 98%  Weight:      Height:        Intake/Output Summary (Last 24 hours) at 12/30/2022 [id number] Last data filed at 12/30/2022 0500 Gross per 24 hour  Intake 229.06 ml  Output --  Net 229.06 ml   Filed Weights   12/29/22 1140  Weight: 79.4 kg    Telemetry     Personally reviewed, new onset Afib with RVR, HR 100-130s  ECG    An ECG dated 12/30/2022 was personally reviewed today and demonstrated:  Afib, HR 97  Physical Exam   GEN: No acute distress.   Neck: No JVD. Cardiac: RRR, no murmur, rub, or gallop.  Respiratory: Nonlabored. Clear to auscultation bilaterally. GI: Soft, nontender, bowel sounds present. MS: No edema;  No deformity. Neuro:  Nonfocal. Psych: Alert and oriented x 3. Normal affect.  Labs    Chemistry Recent Labs  Lab 12/29/22 1143  NA 137  K 3.9  CL 103  CO2 27  GLUCOSE 177*  BUN 19  CREATININE 0.97  CALCIUM 9.2  GFRNONAA >60  ANIONGAP 7     Hematology Recent Labs  Lab 12/29/22 1143  WBC 5.6  RBC 4.17*  HGB 13.4  HCT 40.5  MCV 97.1  MCH 32.1  MCHC 33.1  RDW 14.6  PLT 142*    Cardiac Enzymes Recent Labs  Lab 12/29/22 1143 12/29/22 1340  TROPONINIHS 3 3    BNPNo results for input(s): "BNP", "PROBNP" in the last 168 hours.   DDimer Recent Labs  Lab 12/29/22 1143  DDIMER 0.67*     Radiology    CARDIAC CATHETERIZATION  Result Date: 12/29/2022 1.  Mild obstructive coronary artery disease with no evidence of spasm, dissection, or bridging. 2.  Preserved LV function with no wall motion abnormalities with LVEDP of 17 mmHg. Recommendation: Initiate medical therapy for acute pericarditis and obtain echocardiogram.  Start atorvastatin 20 mg for mild obstructive coronary artery disease and NSAIDs for pericarditis (instead of ASA 67m) acutely.  Consider discharge tomorrow.   DG Chest 2  View  Result Date: 12/29/2022 CLINICAL DATA:  Chest pain, chest tightness EXAM: CHEST - 2 VIEW COMPARISON:  [id number] FINDINGS: Left basilar scarring. No focal consolidation. No pleural effusion or pneumothorax. Heart and mediastinal contours are unremarkable. No acute osseous abnormality. IMPRESSION: No active cardiopulmonary disease. Electronically Signed   By: Kathreen Devoid M.D.   On: 12/29/2022 12:14    Cardiac Studies   LHC on 12/29/2022 1.  Mild obstructive coronary artery disease with no evidence of spasm, dissection, or bridging. 2.  Preserved LV function with no wall motion abnormalities with LVEDP of 17 mmHg  Assessment & Plan    Patient is a 72 year old known to have HTN, HLD, COPD, SCC of head and neck carcinoma presented to ER with chest pain and had EKG changes  concerning for inferior STEMI. He was taken to the Cath Lab for [facility] showed mild nonobstructive CAD.  He is currently being treated for acute pericarditis. Hospital course was complicated by new onset atrial fibrillation with RVR, asymptomatic.  # Paroxysmal atrial fibrillation, asymptomatic -Patient has soft blood pressures precluding use of rate controlling agents.  Start him on amiodarone drip. -Switch heparin drip to Eliquis 5 mg twice daily -Obtain TSH -Outpatient pulmonary referral for lab sleep study  # Sinus pauses, 2 s and 3s during wakeful hours likely related to vagal event, asymptomatic -Continue to monitor  # Acute pericarditis -Continue ibuprofen 800 mg 3 times daily -Continue colchicine 0.6 mg twice daily -Obtain 2D echocardiogram  # HLD -Continue atorvastatin 20 mg QHS  I have spent a total of 33 minutes with patient reviewing chart , telemetry, EKGs, labs and examining patient as well as establishing an assessment and plan that was discussed with the patient.  > 50% of time was spent in direct patient care.     Signed, Chalmers Guest, MD  12/30/2022, 9:38 AM

## 2022-12-30 NOTE — Progress Notes (Signed)
   12/30/22 0410  Assess: MEWS Score  Temp 98.1 F (36.7 C)  BP 106/65  MAP (mmHg) 75  ECG Heart Rate (!) 106  Resp (!) 21  Level of Consciousness Alert  SpO2 95 %  O2 Device Room Air  Patient Activity (if Appropriate) In bed  O2 Flow Rate (L/min) 0 L/min  Assess: MEWS Score  MEWS Temp 0  MEWS Systolic 0  MEWS Pulse 1  MEWS RR 1  MEWS LOC 0  MEWS Score 2  MEWS Score Color Yellow  Assess: if the MEWS score is Yellow or Red  Were vital signs taken at a resting state? Yes  Focused Assessment No change from prior assessment  Does the patient meet 2 or more of the SIRS criteria? No  MEWS guidelines implemented  Yes, yellow  Treat  MEWS Interventions Considered administering scheduled or prn medications/treatments as ordered  Take Vital Signs  Increase Vital Sign Frequency  Yellow: Q2hr x1, continue Q4hrs until patient remains green for 12hrs  Escalate  MEWS: Escalate Yellow: Discuss with charge nurse and consider notifying provider and/or RRT  Notify: Charge Nurse/RN  Name of Charge Nurse/RN Notified Shanel RN  Provider Notification  Provider Name/Title Dr Vickki Muff  Date Provider Notified 12/30/22  Time Provider Notified 0400  Method of Notification Page  Notification Reason Change in status (New Afib)  Provider response See new orders  Date of Provider Response 12/30/22  Time of Provider Response 0400  Assess: SIRS CRITERIA  SIRS Temperature  0  SIRS Pulse 1  SIRS Respirations  1  SIRS WBC 0  SIRS Score Sum  2

## 2022-12-30 NOTE — Progress Notes (Signed)
Pt converted back to NSR. MD aware. EKG completed and placed on physical chart.

## 2022-12-30 NOTE — Progress Notes (Signed)
  Echocardiogram 2D Echocardiogram has been performed.  Brad [name] 12/30/2022, 11:44 AM

## 2022-12-30 NOTE — Progress Notes (Signed)
Pt had episodes of pauses (2 seconds long) and HR dropping to 30's last night but not sustaining. Asymptomatic. VS stable.  @0300$ : HR looked Afib on the monitor. EKG done, Dr informed.

## 2022-12-30 NOTE — Progress Notes (Signed)
ANTICOAGULATION CONSULT NOTE - Initial Consult  Pharmacy Consult for Heparin  Indication: atrial fibrillation  No Known Allergies  Patient Measurements: Height: 5' 8"$  (172.7 cm) Weight: 79.4 kg (175 lb) IBW/kg (Calculated) : 68.4  Vital Signs: Temp: 98.1 F (36.7 C) (02/10 0329) Temp Source: Oral (02/10 0329) BP: 104/58 (02/10 0329) Pulse Rate: 66 (02/09 2106)  Labs: Recent Labs    12/29/22 1143 12/29/22 1340  HGB 13.4  --   HCT 40.5  --   PLT 142*  --   CREATININE 0.97  --   TROPONINIHS 3 3    Estimated Creatinine Clearance: 66.6 mL/min (by C-G formula based on SCr of 0.97 mg/dL).   Medical History: Past Medical History:  Diagnosis Date   Allergy    Bronchitis    chronic   Cancer (HCC)    Constipation    COPD (chronic obstructive pulmonary disease) ([place])    Dyspnea    Hypercholesteremia    Hypertension    Assessment: 72 y/o M s/p cath on 2/9. Now with afib. Starting heparin. CBC/renal function good.   Goal of Therapy:  Heparin level 0.3-0.7 units/ml Monitor platelets by anticoagulation protocol: Yes   Plan:  Start heparin drip at 1200 units/hr Heparin level at 1300 Daily CBC and heparin level Monitor for bleeding  Narda Bonds, PharmD, BCPS Clinical Pharmacist Phone: [phone]

## 2022-12-31 ENCOUNTER — Encounter (HOSPITAL_COMMUNITY): Payer: Self-pay | Admitting: Internal Medicine

## 2022-12-31 DIAGNOSIS — D649 Anemia, unspecified: Secondary | ICD-10-CM

## 2022-12-31 DIAGNOSIS — E7849 Other hyperlipidemia: Secondary | ICD-10-CM | POA: Diagnosis not present

## 2022-12-31 DIAGNOSIS — I2510 Atherosclerotic heart disease of native coronary artery without angina pectoris: Secondary | ICD-10-CM

## 2022-12-31 DIAGNOSIS — E7800 Pure hypercholesterolemia, unspecified: Secondary | ICD-10-CM

## 2022-12-31 DIAGNOSIS — I455 Other specified heart block: Secondary | ICD-10-CM | POA: Diagnosis not present

## 2022-12-31 DIAGNOSIS — I480 Paroxysmal atrial fibrillation: Secondary | ICD-10-CM | POA: Diagnosis not present

## 2022-12-31 DIAGNOSIS — I77819 Aortic ectasia, unspecified site: Secondary | ICD-10-CM

## 2022-12-31 DIAGNOSIS — I300 Acute nonspecific idiopathic pericarditis: Secondary | ICD-10-CM | POA: Diagnosis not present

## 2022-12-31 LAB — BASIC METABOLIC PANEL
Anion gap: 8 (ref 5–15)
BUN: 17 mg/dL (ref 8–23)
CO2: 23 mmol/L (ref 22–32)
Calcium: 8.7 mg/dL — ABNORMAL LOW (ref 8.9–10.3)
Chloride: 106 mmol/L (ref 98–111)
Creatinine, Ser: 1.08 mg/dL (ref 0.61–1.24)
GFR, Estimated: >60 mL/min (ref >60)
Glucose, Bld: 105 mg/dL — ABNORMAL HIGH (ref 70–99)
Potassium: 3.9 mmol/L (ref 3.5–5.1)
Sodium: 137 mmol/L (ref 135–145)

## 2022-12-31 LAB — HEPATIC FUNCTION PANEL
ALT: 14 U/L (ref 0–44)
AST: 20 U/L (ref 15–41)
Albumin: 2.8 g/dL — ABNORMAL LOW (ref 3.5–5.0)
Alkaline Phosphatase: 45 U/L (ref 38–126)
Bilirubin, Direct: <0.1 mg/dL (ref 0.0–0.2)
Total Bilirubin: 0.3 mg/dL (ref 0.3–1.2)
Total Protein: 5.4 g/dL — ABNORMAL LOW (ref 6.5–8.1)

## 2022-12-31 LAB — CBC
HCT: 36.1 % — ABNORMAL LOW (ref 39.0–52.0)
Hemoglobin: 11.9 g/dL — ABNORMAL LOW (ref 13.0–17.0)
MCH: 32.3 pg (ref 26.0–34.0)
MCHC: 33 g/dL (ref 30.0–36.0)
MCV: 98.1 fL (ref 80.0–100.0)
Platelets: 141 10*3/uL — ABNORMAL LOW (ref 150–400)
RBC: 3.68 MIL/uL — ABNORMAL LOW (ref 4.22–5.81)
RDW: 14.7 % (ref 11.5–15.5)
WBC: 5.6 10*3/uL (ref 4.0–10.5)
nRBC: 0 % (ref 0.0–0.2)

## 2022-12-31 LAB — TSH: TSH: 5.458 u[IU]/mL — ABNORMAL HIGH (ref 0.350–4.500)

## 2022-12-31 MED ORDER — IBUPROFEN 200 MG PO TABS: ORAL_TABLET | ORAL | 0 refills | Status: DC

## 2022-12-31 MED ORDER — PANTOPRAZOLE SODIUM 40 MG PO TBEC: 40.0000 mg | DELAYED_RELEASE_TABLET | Freq: Every day | ORAL | 0 refills | Status: DC

## 2022-12-31 MED ORDER — COLCHICINE 0.6 MG PO TABS: 0.6000 mg | ORAL_TABLET | Freq: Two times a day (BID) | ORAL | 2 refills | Status: DC

## 2022-12-31 MED ORDER — APIXABAN 5 MG PO TABS: 5.0000 mg | ORAL_TABLET | Freq: Two times a day (BID) | ORAL | 5 refills | Status: DC

## 2022-12-31 NOTE — TOC Transition Note (Signed)
Transition of Care [facility]) - CM/SW Discharge Note   Patient Details  Name: Brad [name] MRN: [id number] Date of Birth: [birth year]  [facility]) CM/SW Contact:  Maebelle Munroe, RN Phone Number: 12/31/2022, 11:09 AM   Clinical Narrative:  [facility] team spoke to patient via telephone. Patient is alert, verbally responsive and pleasant. He shares his wife just entered the room and will drive him home. He voices that she will take him to follow up appointments. No further needs identified at this time.     Final next level of care: Home/Self Care Barriers to Discharge: No Barriers Identified   Patient Goals and CMS Choice      Discharge Placement                         Discharge Plan and Services Additional resources added to the After Visit Summary for                  DME Arranged: N/A           HH Agency: NA        Social Determinants of Health (SDOH) Interventions SDOH Screenings   Food Insecurity: No Food Insecurity (12/30/2022)  Housing: Low Risk  (12/30/2022)  Transportation Needs: No Transportation Needs (12/30/2022)  Utilities: Not At Risk (12/30/2022)  Financial Resource Strain: Low Risk  (04/14/2022)  Tobacco Use: High Risk (12/31/2022)     Readmission Risk Interventions     No data to display

## 2022-12-31 NOTE — Progress Notes (Signed)
I spoke with Augustin Coupe and Alise in lab and chemistry and both have assured me that hepatic function panel and TSH being ran on machine

## 2022-12-31 NOTE — Progress Notes (Signed)
Patient discharged with family to private vehicle via wheelchair. No further questions. Patient understood discharge instructions

## 2022-12-31 NOTE — Progress Notes (Addendum)
Progress Note  Patient Name: Brad [name] Date of Encounter: 12/31/2022  Primary Cardiologist: None  Subjective   Patient underwent LHC on 12/29/2022 due to STEMI, showed mild nonobstructive CAD. He is currently being treated for acute pericarditis.  Patient had bradycardia and pauses ranging between 2 and 3 seconds yesterday evening probably related to vagal event. Otherwise, patient denies any symptoms of dizziness, palpitations, SOB or chest pressure.  Hospital course was complicated by new onset of atrial fibrillation with RVR which was converted to NSR with amiodarone drip.  Inpatient Medications    Scheduled Meds:  apixaban  5 mg Oral BID   atorvastatin  20 mg Oral Daily   colchicine  0.6 mg Oral BID   ibuprofen  800 mg Oral TID   pantoprazole  40 mg Oral Daily   sodium chloride flush  3 mL Intravenous Q12H   Continuous Infusions:  sodium chloride     PRN Meds: sodium chloride, acetaminophen, albuterol, ondansetron (ZOFRAN) IV, sodium chloride flush   Vital Signs    Vitals:   12/30/22 1202 12/30/22 1621 12/30/22 2009 12/31/22 0346  BP: (!) 104/50 (!) 105/59 (!) 120/55 112/62  Pulse:   60 (!) 54  Resp: 14 16 16 17  $ Temp: 98 F (36.7 C) 97.9 F (36.6 C) 98.2 F (36.8 C) 97.7 F (36.5 C)  TempSrc: Oral Oral Oral Oral  SpO2: 95% 96% 97% 96%  Weight:      Height:        Intake/Output Summary (Last 24 hours) at 12/31/2022 0738 Last data filed at 12/31/2022 0725 Gross per 24 hour  Intake 389.89 ml  Output 300 ml  Net 89.89 ml   Filed Weights   12/29/22 1140  Weight: 79.4 kg    Telemetry     Personally reviewed, converted from A-fib to NSR on 12/30/2018 4 in the afternoon.  Stays in NSR.  ECG    An ECG dated 12/30/2022 was personally reviewed today and demonstrated:  Afib, HR 97  Physical Exam   GEN: No acute distress.   Neck: No JVD. Cardiac: RRR, no murmur, rub, or gallop.  Respiratory: Nonlabored. Clear to auscultation bilaterally. GI: Soft,  nontender, bowel sounds present. MS: No edema; No deformity. Neuro:  Nonfocal. Psych: Alert and oriented x 3. Normal affect.  Labs    Chemistry Recent Labs  Lab 12/29/22 1143  NA 137  K 3.9  CL 103  CO2 27  GLUCOSE 177*  BUN 19  CREATININE 0.97  CALCIUM 9.2  GFRNONAA >60  ANIONGAP 7     Hematology Recent Labs  Lab 12/29/22 1143  WBC 5.6  RBC 4.17*  HGB 13.4  HCT 40.5  MCV 97.1  MCH 32.1  MCHC 33.1  RDW 14.6  PLT 142*    Cardiac Enzymes Recent Labs  Lab 12/29/22 1143 12/29/22 1340  TROPONINIHS 3 3    BNPNo results for input(s): "BNP", "PROBNP" in the last 168 hours.   DDimer Recent Labs  Lab 12/29/22 1143  DDIMER 0.67*     Radiology    ECHOCARDIOGRAM COMPLETE  Result Date: 12/30/2022    ECHOCARDIOGRAM REPORT   Patient Name:   Brad [name] Date of Exam: 12/30/2022 Medical Rec #:  [id number]     Height:       68.0 in Accession #:    [id number]    Weight:       175.0 lb Date of Birth:  [birth year]     BSA:  1.931 m Patient Age:    72 years      BP:           100/74 mmHg Patient Gender: M             HR:           86 bpm. Exam Location:  Inpatient Procedure: 2D Echo, Cardiac Doppler, Color Doppler and Intracardiac            Opacification Agent Indications:    Chest Pain R07.9  History:        Patient has no prior history of Echocardiogram examinations.                 Pericarditis, Pulmonary HTN, Signs/Symptoms:Chest Pain; Risk                 Factors:Current Smoker, Hypertension and Dyslipidemia.  Sonographer:    Greer Pickerel Referring Phys: [id number] Early Osmond  Sonographer Comments: Technically difficult study due to poor echo windows. Image acquisition challenging due to patient body habitus and Image acquisition challenging due to respiratory motion. IMPRESSIONS  1. Left ventricular ejection fraction, by estimation, is 55 to 60%. The left ventricle has normal function. The left ventricle has no regional wall motion abnormalities. Left ventricular  diastolic function could not be evaluated.  2. Right ventricular systolic function is low normal. The right ventricular size is normal. There is normal pulmonary artery systolic pressure. The estimated right ventricular systolic pressure is 0000000 mmHg.  3. Left atrial size was mildly dilated.  4. The mitral valve is normal in structure. No evidence of mitral valve regurgitation. No evidence of mitral stenosis.  5. The aortic valve is tricuspid. Aortic valve regurgitation is not visualized. No aortic stenosis is present.  6. Aortic dilatation noted. There is borderline dilatation of the aortic root, measuring 38 mm. There is mild dilatation of the ascending aorta, measuring 40 mm.  7. The inferior vena cava is dilated in size with >50% respiratory variability, suggesting right atrial pressure of 8 mmHg. Comparison(s): No prior Echocardiogram. FINDINGS  Left Ventricle: Left ventricular ejection fraction, by estimation, is 55 to 60%. The left ventricle has normal function. The left ventricle has no regional wall motion abnormalities. Definity contrast agent was given IV to delineate the left ventricular  endocardial borders. The left ventricular internal cavity size was normal in size. There is no left ventricular hypertrophy. Left ventricular diastolic function could not be evaluated due to nondiagnostic images. Left ventricular diastolic function could not be evaluated. Right Ventricle: The right ventricular size is normal. No increase in right ventricular wall thickness. Right ventricular systolic function is low normal. There is normal pulmonary artery systolic pressure. The tricuspid regurgitant velocity is 1.26 m/s,  and with an assumed right atrial pressure of 8 mmHg, the estimated right ventricular systolic pressure is 0000000 mmHg. Left Atrium: Left atrial size was mildly dilated. Right Atrium: Right atrial size was normal in size. Pericardium: There is no evidence of pericardial effusion. Mitral Valve: The mitral  valve is normal in structure. No evidence of mitral valve regurgitation. No evidence of mitral valve stenosis. Tricuspid Valve: The tricuspid valve is normal in structure. Tricuspid valve regurgitation is not demonstrated. No evidence of tricuspid stenosis. Aortic Valve: The aortic valve is tricuspid. Aortic valve regurgitation is not visualized. No aortic stenosis is present. Pulmonic Valve: The pulmonic valve was not well visualized. Pulmonic valve regurgitation is not visualized. No evidence of pulmonic stenosis. Aorta: Aortic dilatation noted. There is  borderline dilatation of the aortic root, measuring 38 mm. There is mild dilatation of the ascending aorta, measuring 40 mm. Venous: The inferior vena cava is dilated in size with greater than 50% respiratory variability, suggesting right atrial pressure of 8 mmHg. IAS/Shunts: No atrial level shunt detected by color flow Doppler.  LEFT VENTRICLE PLAX 2D LVIDd:         4.30 cm   Diastology LVIDs:         3.20 cm   LV e' medial:    10.10 cm/s LV PW:         1.10 cm   LV E/e' medial:  6.8 LV IVS:        0.90 cm   LV e' lateral:   13.20 cm/s LVOT diam:     1.90 cm   LV E/e' lateral: 5.2 LV SV:         58 LV SV Index:   30 LVOT Area:     2.84 cm  RIGHT VENTRICLE RV S prime:     9.17 cm/s TAPSE (M-mode): 1.4 cm LEFT ATRIUM           Index        RIGHT ATRIUM           Index LA diam:      3.80 cm 1.97 cm/m   RA Area:     17.10 cm LA Vol (A2C): 63.0 ml 32.63 ml/m  RA Volume:   47.70 ml  24.70 ml/m LA Vol (A4C): 67.7 ml 35.06 ml/m  AORTIC VALVE LVOT Vmax:   105.00 cm/s LVOT Vmean:  66.900 cm/s LVOT VTI:    0.204 m  AORTA Ao Root diam: 3.80 cm Ao Asc diam:  4.00 cm MITRAL VALVE               TRICUSPID VALVE MV Area (PHT): 4.39 cm    TR Peak grad:   6.4 mmHg MV Decel Time: 173 msec    TR Vmax:        126.00 cm/s MV E velocity: 68.30 cm/s                            SHUNTS                            Systemic VTI:  0.20 m                            Systemic Diam: 1.90 cm  Kailene Steinhart Priya Jameila Keeny Electronically signed by Lorelee Cover Laurenashley Viar Signature Date/Time: 12/30/2022/2:31:09 PM    Final    CARDIAC CATHETERIZATION  Result Date: 12/29/2022 1.  Mild obstructive coronary artery disease with no evidence of spasm, dissection, or bridging. 2.  Preserved LV function with no wall motion abnormalities with LVEDP of 17 mmHg. Recommendation: Initiate medical therapy for acute pericarditis and obtain echocardiogram.  Start atorvastatin 20 mg for mild obstructive coronary artery disease and NSAIDs for pericarditis (instead of ASA 29m) acutely.  Consider discharge tomorrow.   DG Chest 2 View  Result Date: 12/29/2022 CLINICAL DATA:  Chest pain, chest tightness EXAM: CHEST - 2 VIEW COMPARISON:  [id number]: Left basilar scarring. No focal consolidation. No pleural effusion or pneumothorax. Heart and mediastinal contours are unremarkable. No acute osseous abnormality. IMPRESSION: No active cardiopulmonary disease. Electronically Signed   By: HBoston ServiceD.  On: 12/29/2022 12:14    Cardiac Studies   LHC on 12/29/2022 1.  Mild obstructive coronary artery disease with no evidence of spasm, dissection, or bridging. 2.  Preserved LV function with no wall motion abnormalities with LVEDP of 17 mmHg  Assessment & Plan    Patient is a 72 year old known to have HTN, HLD, COPD, SCC of head and neck carcinoma presented to ER with chest pain and had EKG changes concerning for inferior STEMI. He was taken to the Cath Lab for [facility] showed mild nonobstructive CAD.  He is currently being treated for acute pericarditis. Hospital course was complicated by new onset atrial fibrillation with RVR, asymptomatic.  # Paroxysmal atrial fibrillation, asymptomatic (Converted to NSR with amiodarone) -Patient has soft blood pressures precluding use of rate controlling agents.  Cannot do long term Amio due to increased risk of colchicine toxicity. -Continue Eliquis 5 mg twice daily (CV score is  2) -Obtain TSH, outpatient -Outpatient pulm referral for lab sleep study -Outpatient cardiology follow-up in 1 month upon discharge  # Sinus pauses, 2 s and 3s during wakeful hours likely related to vagal event, asymptomatic -Continue to monitor  # Acute pericarditis -Continue ibuprofen 800 mg 3 times daily x 1st week followed by 600 mg 3 times daily x second week followed by ibuprofen 300 mg 3 times daily x third week. Continue pantoprazole 40 mg in the a.m. x 1 month. -Continue colchicine 0.6 mg twice daily x 3 months  # HLD -Hold statin until completion of colchicine therapy  # Borderline dilatation of aortic root, 38 mm # Mild dilatation of the ascending aorta, 40 mm (Per echocardiogram from 12/30/2022) -Outpatient CT surveillance  Disposition: Discharge to home today after obtaining CBC, BMP.  I have spent a total of 33 minutes with patient reviewing chart , telemetry, EKGs, labs and examining patient as well as establishing an assessment and plan that was discussed with the patient.  > 50% of time was spent in direct patient care.     Signed, Chalmers Guest, MD  12/31/2022, 7:38 AM

## 2022-12-31 NOTE — Discharge Summary (Addendum)
Discharge Summary    Patient ID: ABUNDIO LEMPERT MRN: [id number]; DOB: [birth year]  Admit date: 12/29/2022 Discharge date: 12/31/2022  PCP:  Lujean Amel, [place] Prov[facility] Cardiologist:  Early Osmond, MD        Discharge Diagnoses    Principal Problem:   Pericarditis Active Problems:   Dilatation of aorta (HCC)   Hypercholesteremia   Mild anemia   PAF (paroxysmal atrial fibrillation) (HCC)   Mild CAD   Sinus pause    Diagnostic Studies/Procedures    2D echo 12/30/22   1. Left ventricular ejection fraction, by estimation, is 55 to 60%. The  left ventricle has normal function. The left ventricle has no regional  wall motion abnormalities. Left ventricular diastolic function could not  be evaluated.   2. Right ventricular systolic function is low normal. The right  ventricular size is normal. There is normal pulmonary artery systolic  pressure. The estimated right ventricular systolic pressure is 0000000 mmHg.   3. Left atrial size was mildly dilated.   4. The mitral valve is normal in structure. No evidence of mitral valve  regurgitation. No evidence of mitral stenosis.   5. The aortic valve is tricuspid. Aortic valve regurgitation is not  visualized. No aortic stenosis is present.   6. Aortic dilatation noted. There is borderline dilatation of the aortic  root, measuring 38 mm. There is mild dilatation of the ascending aorta,  measuring 40 mm.   7. The inferior vena cava is dilated in size with >50% respiratory  variability, suggesting right atrial pressure of 8 mmHg.   Comparison(s): No prior Echocardiogram.   Cath 12/29/22 1.  Mild obstructive coronary artery disease with no evidence of spasm, dissection, or bridging. 2.  Preserved LV function with no wall motion abnormalities with LVEDP of 17 mmHg.   Recommendation: Initiate medical therapy for acute pericarditis and obtain echocardiogram.  Start atorvastatin 20 mg for mild obstructive coronary  artery disease and NSAIDs for pericarditis (instead of ASA 65m) acutely.  Consider discharge tomorrow.   _____________   History of Present Illness     DJAVORIS STEENHOEKis a 72y.o. male with HLD, COPD, SCC of head and neck carcinoma, possible prior HTN no prior cardiac history [facility]. He was in his USOH until day prior to admission. He had an episode of centralized indigestion which he treated with antiacid with improvement. However, symptoms recurred waking him from sleep, not improved with antacid, so came to the ER. In the ED his labs showed sodium 137, potassium 3.9, creatinine 0.9, high-sensitivity troponin 3, WBC 5.6, hemoglobin 13.4, D-dimer 0.67.  Chest x-ray negative. Initial EKG (11:37 AM) showed sinus rhythm, 76 bpm, slight elevation in lead II.  Repeat EKG (11:45 AM) sinus rhythm with slightly increased ST elevation in lead II with diffuse ST elevation suspicious for pericarditis.  He was given 324 of aspirin and 1 sublingual nitroglycerin. He subsequently developed hypotension and bradycardia with repeat EKG showing more dramatic changes in inferior leads therefore code STEMI was called. He was given IV heparin, placed on Levophed with improvement in BP, [facility]  [facility] Course     1. Acute pericarditis, only mild CAD by cath - cardiac cath 12/29/22 showed mild obstructive coronary artery disease with no evidence of spasm, dissection, or bridging, had preserved LV function with no wall motion abnormalities with LVEDP of 17 mmHg -  2D echo 12/30/22 showed EF 55-60%, low normal RVSF, normal PASP, mild LAE, borderline dilation of aortic root and mild dilation of ascending aorta, dilated IVC, no pericardial effusion - d-dimer mildly elevated at 0.67 but normal for age adjusted, not tachycardic, tachypneic or hypoxic therefore CTA not pursued - troponin negative - symptoms were felt  consistent with pericarditis -  ibuprofen 800 mg 3 times daily x 1st week (completed 2 days of therapy here) followed by 600 mg 3 times daily x second week followed by ibuprofen 300 mg 3 times daily x third week with pantoprazole 40 mg in the a.m. x 1 month, and colchicine 0.101m BID x 3 months  2. Paroxysmal atrial fibrillation, asymptomatic - transiently noted during admission with RVR, started on amiodarone drip and heparin with subsequent reversion to NSR (was in Afib with RVR for at least 10 hours) - Patient was in Afib with RVR for at least 10 hours, hence will need to start Eliquis 565mBID, bleeding precautions reviewed with patient - initially there was consideration of starting oral amiodarone for afib though given drug interaction with colchicine, we will hold off per d/w MD - additionally baseline HR 50s today so will hold off additional AVN blocking agents - patient encouraged to get a smartwatch for surveillance of HR  - TSH, LFTs pending at time of dc - recommend arrangement of sleep study at f/u appointment  3. Sinus pauses - 2-3 sec during wakeful hours, likely related to vagal event, asymptomatic  4. Hyperlipidemia - initially started on atorvastatin but was on rosuvastatin at home - given drug interaction with colchicine, home rosuvastatin was discontinued - would recommend to resume after he completes 3 month course of colchicine - review at follow-up  5. Borderline dilation of aortic root and mild dilation of ascending aorta by echo - will need OP surveillance  6. Mild anemia/thrombocytopenia - Hgb 11.9, plt 141 day of discharge - prior Hgb 08/2022 was 12.5 with plt 159 - recommend to trend at f/u visit   Did the patient have an acute coronary syndrome (MI, NSTEMI, STEMI, etc) this admission?:  No                               Did the patient have a percutaneous coronary intervention (stent / angioplasty)?:  No.          _____________  Discharge Vitals Blood  pressure 121/65, pulse (!) 54, temperature (!) 97.5 F (36.4 C), temperature source Oral, resp. rate 19, height 5' 8"$  (1.727 m), weight 79.4 kg, SpO2 99 %.  (Confirmed with nurse that the 77% listed earlier was in error) Filed Weights   12/29/22 1140  Weight: 79.4 kg    Labs & Radiologic Studies    CBC Recent Labs    12/29/22 1143 12/31/22 0749  WBC 5.6 5.6  NEUTROABS 4.3  --   HGB 13.4 11.9*  HCT 40.5 36.1*  MCV 97.1 98.1  PLT 142* [id number]  Basic Metabolic Panel Recent Labs    12/29/22 1143 12/31/22 0749  NA 137 137  K 3.9 3.9  CL 103 106  CO2 27 23  GLUCOSE 177* 105*  BUN 19 17  CREATININE 0.97 1.08  CALCIUM 9.2 8.7*   Liver Function Tests No results for input(s): "AST", "ALT", "ALKPHOS", "BILITOT", "PROT", "ALBUMIN" in the last 72 hours. No results for input(s): "LIPASE", "AMYLASE" in the last 72 hours. High Sensitivity Troponin:  Recent Labs  Lab 12/29/22 1143 12/29/22 1340  TROPONINIHS 3 3    BNP Invalid input(s): "POCBNP" D-Dimer Recent Labs    12/29/22 1143  DDIMER 0.67*   Hemoglobin A1C No results for input(s): "HGBA1C" in the last 72 hours. Fasting Lipid Panel No results for input(s): "CHOL", "HDL", "LDLCALC", "TRIG", "CHOLHDL", "LDLDIRECT" in the last 72 hours. Thyroid Function Tests No results for input(s): "TSH", "T4TOTAL", "T3FREE", "THYROIDAB" in the last 72 hours.  Invalid input(s): "FREET3" _____________  ECHOCARDIOGRAM COMPLETE  Result Date: 12/30/2022    ECHOCARDIOGRAM REPORT   Patient Name:   HARVE MCGRUDER Date of Exam: 12/30/2022 Medical Rec #:  [id number]     Height:       68.0 in Accession #:    [id number]    Weight:       175.0 lb Date of Birth:  1950-12-11     BSA:          1.931 m Patient Age:    1 years      BP:           100/74 mmHg Patient Gender: M             HR:           86 bpm. Exam Location:  Inpatient Procedure: 2D Echo, Cardiac Doppler, Color Doppler and Intracardiac            Opacification Agent Indications:    Chest  Pain R07.9  History:        Patient has no prior history of Echocardiogram examinations.                 Pericarditis, Pulmonary HTN, Signs/Symptoms:Chest Pain; Risk                 Factors:Current Smoker, Hypertension and Dyslipidemia.  Sonographer:    Greer Pickerel Referring Phys: [id number] Early Osmond  Sonographer Comments: Technically difficult study due to poor echo windows. Image acquisition challenging due to patient body habitus and Image acquisition challenging due to respiratory motion. IMPRESSIONS  1. Left ventricular ejection fraction, by estimation, is 55 to 60%. The left ventricle has normal function. The left ventricle has no regional wall motion abnormalities. Left ventricular diastolic function could not be evaluated.  2. Right ventricular systolic function is low normal. The right ventricular size is normal. There is normal pulmonary artery systolic pressure. The estimated right ventricular systolic pressure is 0000000 mmHg.  3. Left atrial size was mildly dilated.  4. The mitral valve is normal in structure. No evidence of mitral valve regurgitation. No evidence of mitral stenosis.  5. The aortic valve is tricuspid. Aortic valve regurgitation is not visualized. No aortic stenosis is present.  6. Aortic dilatation noted. There is borderline dilatation of the aortic root, measuring 38 mm. There is mild dilatation of the ascending aorta, measuring 40 mm.  7. The inferior vena cava is dilated in size with >50% respiratory variability, suggesting right atrial pressure of 8 mmHg. Comparison(s): No prior Echocardiogram. FINDINGS  Left Ventricle: Left ventricular ejection fraction, by estimation, is 55 to 60%. The left ventricle has normal function. The left ventricle has no regional wall motion abnormalities. Definity contrast agent was given IV to delineate the left ventricular  endocardial borders. The left ventricular internal cavity size was normal in size. There is no left ventricular hypertrophy.  Left ventricular diastolic function could not be evaluated due to nondiagnostic images. Left ventricular diastolic function could not be evaluated. Right Ventricle: The  right ventricular size is normal. No increase in right ventricular wall thickness. Right ventricular systolic function is low normal. There is normal pulmonary artery systolic pressure. The tricuspid regurgitant velocity is 1.26 m/s,  and with an assumed right atrial pressure of 8 mmHg, the estimated right ventricular systolic pressure is 0000000 mmHg. Left Atrium: Left atrial size was mildly dilated. Right Atrium: Right atrial size was normal in size. Pericardium: There is no evidence of pericardial effusion. Mitral Valve: The mitral valve is normal in structure. No evidence of mitral valve regurgitation. No evidence of mitral valve stenosis. Tricuspid Valve: The tricuspid valve is normal in structure. Tricuspid valve regurgitation is not demonstrated. No evidence of tricuspid stenosis. Aortic Valve: The aortic valve is tricuspid. Aortic valve regurgitation is not visualized. No aortic stenosis is present. Pulmonic Valve: The pulmonic valve was not well visualized. Pulmonic valve regurgitation is not visualized. No evidence of pulmonic stenosis. Aorta: Aortic dilatation noted. There is borderline dilatation of the aortic root, measuring 38 mm. There is mild dilatation of the ascending aorta, measuring 40 mm. Venous: The inferior vena cava is dilated in size with greater than 50% respiratory variability, suggesting right atrial pressure of 8 mmHg. IAS/Shunts: No atrial level shunt detected by color flow Doppler.  LEFT VENTRICLE PLAX 2D LVIDd:         4.30 cm   Diastology LVIDs:         3.20 cm   LV e' medial:    10.10 cm/s LV PW:         1.10 cm   LV E/e' medial:  6.8 LV IVS:        0.90 cm   LV e' lateral:   13.20 cm/s LVOT diam:     1.90 cm   LV E/e' lateral: 5.2 LV SV:         58 LV SV Index:   30 LVOT Area:     2.84 cm  RIGHT VENTRICLE RV S prime:      9.17 cm/s TAPSE (M-mode): 1.4 cm LEFT ATRIUM           Index        RIGHT ATRIUM           Index LA diam:      3.80 cm 1.97 cm/m   RA Area:     17.10 cm LA Vol (A2C): 63.0 ml 32.63 ml/m  RA Volume:   47.70 ml  24.70 ml/m LA Vol (A4C): 67.7 ml 35.06 ml/m  AORTIC VALVE LVOT Vmax:   105.00 cm/s LVOT Vmean:  66.900 cm/s LVOT VTI:    0.204 m  AORTA Ao Root diam: 3.80 cm Ao Asc diam:  4.00 cm MITRAL VALVE               TRICUSPID VALVE MV Area (PHT): 4.39 cm    TR Peak grad:   6.4 mmHg MV Decel Time: 173 msec    TR Vmax:        126.00 cm/s MV E velocity: 68.30 cm/s                            SHUNTS                            Systemic VTI:  0.20 m  Systemic Diam: 1.90 cm Shakura Cowing Priya Yakir Wenke Electronically signed by Lorelee Cover Naheim Burgen Signature Date/Time: 12/30/2022/2:31:09 PM    Final    CARDIAC CATHETERIZATION  Result Date: 12/29/2022 1.  Mild obstructive coronary artery disease with no evidence of spasm, dissection, or bridging. 2.  Preserved LV function with no wall motion abnormalities with LVEDP of 17 mmHg. Recommendation: Initiate medical therapy for acute pericarditis and obtain echocardiogram.  Start atorvastatin 20 mg for mild obstructive coronary artery disease and NSAIDs for pericarditis (instead of ASA 13m) acutely.  Consider discharge tomorrow.   DG Chest 2 View  Result Date: 12/29/2022 CLINICAL DATA:  Chest pain, chest tightness EXAM: CHEST - 2 VIEW COMPARISON:  [id number]: Left basilar scarring. No focal consolidation. No pleural effusion or pneumothorax. Heart and mediastinal contours are unremarkable. No acute osseous abnormality. IMPRESSION: No active cardiopulmonary disease. Electronically Signed   By: HKathreen DevoidM.D.   On: 12/29/2022 12:14   Disposition   Pt is being discharged home today in good condition.  Follow-up Plans & Appointments     Follow-up Information     DCharlie Pitter PA-C Follow up.   Specialties: Cardiology,  Radiology [place]: [facility] cardi[facility] follow-up arranged on Monday Jan 08, 2023 at 2:45 PM (Arrive by 2:30 PM). Contact information: [address] [phone]               Discharge Instructions     AMB referral to Phase II Cardiac Rehabilitation   Complete by: As directed    Diagnosis: Other   After initial evaluation and assessments completed: Virtual Based Care may be provided alone or in conjunction with Phase 2 Cardiac Rehab based on patient barriers.: Yes   [facility] ([facility]) [facility] only OR Traditional Cardiac Rehabilitation (TCR) *If criteria for ICR are not met [facility] Yes   Diet - low sodium heart healthy   Complete by: As directed    Discharge instructions   Complete by: As directed    You were started on a new blood thinner called apixaban/Eliquis since you had atrial fibrillation seen in the hospital. Because you are going home on this new blood thinner, you will no longer take aspirin. If you notice any bleeding such as blood in stool, black tarry stools, blood in urine, nosebleeds or any other unusual bleeding, call your doctor immediately. It is not normal to have this kind of bleeding while on a blood thinner and usually indicates there is an underlying problem with one of your body systems that needs to be checked out.   For treatment of your pericarditis:  - Continue ibuprofen according to scheduled directions, not just as-needed. You should space out the doses about every 8 hours. Take this with food. If you notice any stomach upset or signs of gastrointestinal bleeding such as blood in stool or black stools, stop this medicine and call your doctor immediately - You were started on pantoprazole to protect your stomach while taking ibuprofen. Take this every day for a month. - You will continue colchicine for 3 months then stop. Because  colchicine can interact with cholesterol medicine, you will stop your rosuvastatin while on colchicine. When you finish colchicine, you can go back on your rosuvastatin.  We removed lidocaine and nicotine patches from your medicine list as you indicated you are no longer taking this.   Increase activity slowly   Complete by:  As directed    No driving for 1 day. No lifting over 5 lbs for 1 week. No sexual activity for 1 week. Keep procedure site clean & dry. If you notice increased pain, swelling, bleeding or pus, call/return!  You may shower, but no soaking baths/hot tubs/pools for 1 week.        Discharge Medications   Allergies as of 12/31/2022   No Known Allergies      Medication List     STOP taking these medications    aspirin 81 MG chewable tablet   lidocaine 2 % solution Commonly known as: XYLOCAINE   nicotine 14 mg/24hr patch Commonly known as: NICODERM CQ - dosed in mg/24 hours   nicotine 21 mg/24hr patch Commonly known as: NICODERM CQ - dosed in mg/24 hours   nicotine 7 mg/24hr patch Commonly known as: NICODERM CQ - dosed in mg/24 hr   rosuvastatin 20 MG tablet Commonly known as: CRESTOR       TAKE these medications    albuterol 108 (90 Base) MCG/ACT inhaler Commonly known as: VENTOLIN HFA Inhale 2 puffs into the lungs every 6 (six) hours as needed for shortness of breath. shortness of breath   apixaban 5 MG Tabs tablet Commonly known as: ELIQUIS Take 1 tablet (5 mg total) by mouth 2 (two) times daily.   Breztri Aerosphere 160-9-4.8 MCG/ACT Aero Generic drug: Budeson-Glycopyrrol-Formoterol Inhale 2 puffs into the lungs in the morning and at bedtime.   colchicine 0.6 MG tablet Take 1 tablet (0.6 mg total) by mouth 2 (two) times daily.   HYDROcodone-acetaminophen 7.5-325 MG tablet Commonly known as: Norco Take 1 tablet by mouth every 6 (six) hours as needed for moderate pain.   ibuprofen 200 MG tablet Commonly known as: ADVIL Take 4 tablets  (800 mg) by mouth 3 times a day for 5 days, then decrease to 3 tablets (600 mg) by mouth 3 times a day for 1 week, then decrease to 1.5 tablets (300 mg) by mouth 3 times a day for 1 week then stop. Take with food.   multivitamin with minerals Tabs tablet Take 1 tablet by mouth in the morning.   ondansetron 8 MG disintegrating tablet Commonly known as: ZOFRAN-ODT Take 1 tablet (8 mg total) by mouth every 8 (eight) hours as needed for nausea or vomiting.   pantoprazole 40 MG tablet Commonly known as: PROTONIX Take 1 tablet (40 mg total) by mouth daily. Start taking on: January 01, 2023   sildenafil 100 MG tablet Commonly known as: VIAGRA Take 100 mg by mouth daily as needed for erectile dysfunction.   tamsulosin 0.4 MG Caps capsule Commonly known as: FLOMAX Take 0.4 mg by mouth in the morning.   Testosterone 20.25 MG/ACT (1.62%) Gel Apply 1 Pump topically daily.   VITAMIN B-12 PO Take 1 tablet by mouth in the morning.   VITAMIN D3 PO Take 1 tablet by mouth in the morning.   vitamin E 180 MG (400 UNITS) capsule Take 400 Units by mouth in the morning.           Outstanding Labs/Studies   F/u CBC as OP  Duration of Discharge Encounter   Greater than 30 minutes including physician time.  Signed, Zian Delair Fidel Levy, MD [place]

## 2022-12-31 NOTE — Progress Notes (Signed)
I called lab and spoke with Lin in [place]. He stated he would add the TSH and hepatic function panel to this am's labs.

## 2023-01-01 ENCOUNTER — Encounter (HOSPITAL_COMMUNITY): Payer: Self-pay | Admitting: Internal Medicine

## 2023-01-02 LAB — LIPOPROTEIN A (LPA): Lipoprotein (a): 45.2 nmol/L — ABNORMAL HIGH (ref <75.0)

## 2023-01-07 NOTE — Progress Notes (Unsigned)
Cardiology Clinic Note   Patient Name: Brad [name] Date of Encounter: 01/08/2023  Primary Care Provider:  Lujean Amel, MD Primary Cardiologist:  Early Osmond, MD  Patient Profile    Brad [name] is a 72 y.o. 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  Past Medical History    Past Medical History:  Diagnosis Date   Allergy    Bronchitis    chronic   Cancer (HCC)    Constipation    COPD (chronic obstructive pulmonary disease) (HCC)    Dilatation of aorta (HCC)    Dyspnea    Hypercholesteremia    Hypertension    Mild anemia    Mild CAD    PAF (paroxysmal atrial fibrillation) (HCC)    Pericarditis    Sinus pause    Past Surgical History:  Procedure Laterality Date   COLONOSCOPY     FRACTURE SURGERY     right wrist   LARYNGOSCOPY AND ESOPHAGOSCOPY N/A 03/03/2022   Procedure: DIRECT LARYNGOSCOPY AND ESOPHAGOSCOPY BIOPSIES AND FROZEN SECTIONS;  Surgeon: Izora Gala, MD;  Location: [place];  Service: ENT;  Laterality: N/A;   LEFT HEART CATH AND CORONARY ANGIOGRAPHY N/A 12/29/2022   Procedure: LEFT HEART CATH AND CORONARY ANGIOGRAPHY;  Surgeon: Early Osmond, MD;  Location: [place] [facility];  Service: Cardiovascular;  Laterality: N/A;   Lipo Suction     NASOPHARYNGOSCOPY N/A 03/03/2022   Procedure: NASOPHARYNGOSCOPY;  Surgeon: Izora Gala, MD;  Location: [place];  Service: ENT;  Laterality: N/A;   POLYPECTOMY     RADICAL NECK DISSECTION Left 03/03/2022   Procedure: NECK DISSECTION;  Surgeon: Izora Gala, MD;  Location: [place];  Service: ENT;  Laterality: Left;    Allergies  No Known Allergies  History of Present Illness     Brad [name] has a past medical history of: Nonobstructive CAD. LHC 12/29/2022: Mild nonobstructive CAD without evidence of spasm, dissection, or bridging.  Recommend medical therapy for acute pericarditis. Bradycardia/sinus pause/PAF. Aortic dilatation. Pericarditis. Echo 12/30/2022: EF 55 to 60%.  Mild LAE.  Borderline aortic root dilatation 38 mm, mild ascending aortic dilatation 40 mm.  Dilated IVC.  RA pressure 8 mmHg.  No pericardial effusion. Hypertension. Hypercholesterolemia. Lipid panel 11/28/2022: LDL 96, HDL 104, TG 59, total 211. COPD. 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 S/p radiation and radical neck dissection on the left April 2023. Tobacco abuse.  Brad [name] was first evaluated by cardiology on 12/29/2022 during hospitalization for chest pain.  Patient presented to [facility] ER with chest tightness radiating to jaw upon awakening on 12/29/2022 that did not improve with antacids.  Pain actually started the night previous and improved with antacids to the point he was able to ride his bicycle without increased discomfort.  Initial EKG showed slight ST elevation in lead II.  Repeat EKG showed slightly increased ST elevation in lead II with diffuse ST elevation suspicious for pericarditis.  He received aspirin 324 mg and 1 SL NTG but developed hypotension and bradycardia.  A third EKG showed more dramatic changes in inferior leads so a STEMI code was called and patient was transferred to [facility] for emergent cardiac catheterization. However, left heart catheterization showed only  mild nonobstructive CAD (outlined above). Therefore, symptoms felt related to pericarditis and he was treated as such with ibuprofen, colchcine, PPI. Patient's hospital course was complicated by bradycardia with 2 to 3-second pauses on 12/30/2022.  This was thought to be related to a vagal event and patient was asymptomatic.  In the early morning patient was noted to be in A-fib  with RVR with heart rates between 110 and 130.  Soft BP precluded use of rate controlling agents.  He was started on amiodarone drip and transitioned from heparin to p.o. Eliquis.  Patient converted to NSR later in the afternoon.  Patient discharged 12/31/2022. Not started on oral amiodarone at dc due to colchicine rx.  Today, patient is doing well. Patient denies shortness of breath or dyspnea on exertion. No chest pain, pressure, or tightness. Denies lower extremity edema, orthopnea, or PND. No palpitations.  He purchased an [employer] about a week ago. It does not appear he started the ECG monitoring on it but it is tracking his heart rate. He reports yesterday for about 10 minutes his heart rate increased into the 130s while he was at rest. There is no ECG tracing to correlate rhythm. He denies cardiac awareness of arrhythmia or tachycardia. He has been working out as he had previously without dyspnea. Discussed obtaining a sleep study. He states his wife tells him he snores but he does not feel it is that bad. He reports waking feeling rested and denies daytime somnolence. He has lost 40 lb in the last year secondary to radiation treatments and feels his snoring may be less than previous. He will discuss this further with his wife and decide if he would like to proceed. He does state "I won't wear a mask." Patient continues to smoke about a half a pack of cigarettes per day. He is interested in quitting and reports he did well on Wellbutrin in the past but is unsure if he wants to restart it at this time. He denies blood in stool or urine or other bleeding issues.    Home Medications    Current Meds  Medication Sig   albuterol (PROVENTIL HFA;VENTOLIN HFA) 108 (90 BASE) MCG/ACT inhaler Inhale 2 puffs into the lungs every 6 (six) hours as needed for shortness of breath. shortness of breath   apixaban (ELIQUIS) 5 MG TABS tablet Take 1 tablet (5 mg total) by mouth 2 (two) times daily.    Budeson-Glycopyrrol-Formoterol (BREZTRI AEROSPHERE) 160-9-4.8 MCG/ACT AERO Inhale 2 puffs into the lungs in the morning and at bedtime.   Cholecalciferol (VITAMIN D3 PO) Take 1 tablet by mouth in the morning.   colchicine 0.6 MG tablet Take 1 tablet (0.6 mg total) by mouth 2 (two) times daily.   Cyanocobalamin (VITAMIN B-12 PO) Take 1 tablet by mouth in the morning.   HYDROcodone-acetaminophen (NORCO) 7.5-325 MG tablet Take 1 tablet by mouth every 6 (six) hours as needed for moderate pain.   ibuprofen (ADVIL) 200 MG tablet Take 4 tablets (800 mg) by mouth 3 times a day for 5 days, then decrease to 3 tablets (600 mg) by mouth 3 times a day for 1 week, then decrease to 1.5 tablets (300 mg) by mouth 3 times a day for 1 week then stop. Take with food.   Multiple Vitamin (MULTIVITAMIN WITH MINERALS) TABS Take 1 tablet by mouth in the morning.   ondansetron (ZOFRAN-ODT) 8 MG disintegrating tablet Take 1 tablet (8 mg total) by mouth every 8 (eight) hours as needed for nausea  or vomiting.   pantoprazole (PROTONIX) 40 MG tablet Take 1 tablet (40 mg total) by mouth daily.   sildenafil (VIAGRA) 100 MG tablet Take 100 mg by mouth daily as needed for erectile dysfunction.   tamsulosin (FLOMAX) 0.4 MG CAPS capsule Take 0.4 mg by mouth in the morning.   Testosterone 20.25 MG/ACT (1.62%) GEL Apply 1 Pump topically daily.   vitamin E 400 UNIT capsule Take 400 Units by mouth in the morning.    Family History    Family History  Problem Relation Age of Onset   Cancer Brother    Colon cancer Neg Hx    Esophageal cancer Neg Hx    Rectal cancer Neg Hx    Stomach cancer Neg Hx    He indicated that his mother is deceased. He indicated that his father is deceased. He indicated that his sister is alive. He indicated that only one of his two brothers is alive. He indicated that the status of his neg hx is unknown.   Social History    Social History   Socioeconomic History   Marital status: Married    Spouse  name: Not on file   Number of children: Not on file   Years of education: Not on file   Highest education level: Not on file  Occupational History   Not on file  Tobacco Use   Smoking status: Light Smoker    Years: 30.00    Types: Cigarettes   Smokeless tobacco: Never   Tobacco comments:    smokes less than a pack a week  Vaping Use   Vaping Use: Never used  Substance and Sexual Activity   Alcohol use: Yes    Alcohol/week: 7.0 standard drinks of alcohol    Types: 7 Shots of liquor per week    Comment: weekly    Drug use: No   Sexual activity: Not Currently  Other Topics Concern   Not on file  Social History Narrative   Not on file   Social Determinants of Health   Financial Resource Strain: Low Risk  (04/14/2022)   Overall Financial Resource Strain (CARDIA)    Difficulty of Paying Living Expenses: Not hard at all  Food Insecurity: No Food Insecurity (12/30/2022)   Hunger Vital Sign    Worried About Running Out of Food in the Last Year: Never true    Ran Out of Food in the Last Year: Never true  Transportation Needs: No Transportation Needs (12/30/2022)   PRAPARE - Hydrologist (Medical): No    Lack of Transportation (Non-Medical): No  Physical Activity: Not on file  Stress: Not on file  Social Connections: Not on file  Intimate Partner Violence: Not At Risk (12/30/2022)   Humiliation, Afraid, Rape, and Kick questionnaire    Fear of Current or Ex-Partner: No    Emotionally Abused: No    Physically Abused: No    Sexually Abused: No     Review of Systems    General:  No chills, fever, night sweats or weight changes.  Cardiovascular:  No chest pain, dyspnea on exertion, edema, orthopnea, palpitations, paroxysmal nocturnal dyspnea. Dermatological: No rash, lesions/masses Respiratory: No cough, dyspnea Urologic: No hematuria, dysuria Abdominal:   No nausea, vomiting, diarrhea, bright red blood per rectum, melena, or hematemesis Neurologic:   No visual changes, weakness, changes in mental status. All other systems reviewed and are otherwise negative except as noted above.  Physical Exam    VS:  BP 138/68  Pulse 82   Ht 5' 8"$  (1.727 m)   Wt 180 lb (81.6 kg)   SpO2 92%   BMI 27.37 kg/m  , BMI Body mass index is 27.37 kg/m. GEN:  Well nourished, well developed, in no acute distress. HEENT: Normal. Neck: Supple, no JVD, carotid bruits, or masses. Cardiac: RRR, no murmurs, rubs, or gallops. No clubbing, cyanosis, edema.  Radials/DP/PT 2+ and equal bilaterally.  Respiratory:  Respirations regular and unlabored, slightly diminished breath sounds bilaterally. Rhonchi left upper lobe clears with cough.  GI: Soft, nontender, nondistended. MS: No deformity or atrophy. Radial cath without overt complication Skin: Warm and dry, no rash. Neuro: Strength and sensation are intact. Psych: Normal affect.  Accessory Clinical Findings     Recent Labs: 12/31/2022: ALT 14; BUN 17; Creatinine, Ser 1.08; Hemoglobin 11.9; Platelets 141; Potassium 3.9; Sodium 137; TSH 5.458   Recent Lipid Panel No results found for: "CHOL", "TRIG", "HDL", "CHOLHDL", "VLDL", "LDLCALC", "LDLDIRECT"     ECG personally reviewed by me today: NSR, rate 82 bpm, non-specific T-wave abnormality. Improved from 12/29/2022.    Assessment & Plan   Pericarditis.  Patient started on treatment during hospitalization 12/30/2022.  He reports he is tolerating medications. EKG shows normal sinus rhythm with nonspecific T-wave abnormality, improved from 12/29/2022. Discussed medication regimen including ibuprofen taper to ensure correct understanding. He will continue colchicine x 3 months and PPI for 1 month. Nonobstructive CAD.  LHC February 2024 showed nonobstructive CAD without evidence of spasm, dissection or bridging.  He is exercising at the gym several times a week without chest pain or dyspnea. Patient not on BB secondary to episodes of bradycardia and soft BP.   Rosuvastatin was temporarily paused while on colchicine. Not on ASA due to concomitant Eliquis (and nonobstructive disease). PAF.  Onset 12/30/2022.  Converted to NSR on IV amiodarone. PO amiodarone not started secondary to colchicine. He recently purchased an [employer] one week ago and has been tracking his heart rate. He noted a 10 minute time period of heart rate readings in the 130s. Patient has not started ECG function on the watch so no rhythm strip available. There may have been other days his heart rate increased while at rest.  Patient has no cardiac awareness of tachycardia or arrhythmia.  Continue Eliquis. Given uncertainty of rhythm during episodes of tachycardia at rest will get a 2-week Zio to further evaluate. Will check CBC, TSH, Free T4 today given recent abnormal TSH. Hyperlipidemia.  LDL January 2024 96, not at goal.  Rosuvastatin currently paused secondary to colchicine for pericarditis.  Will consider resuming after colchicine treatment is completed. Tobacco abuse. Patient continues to smoke half a pack of cigarettes per day. He is interested in quitting. He did well on Wellbutrin in the past but is unsure if he wants to restart it. Discussed setting small goals to cut back on what he is currently smoking while he decides. He agrees with this plan.    Disposition: CBC, TSH, free T4 today. 2 week Zio. Return in 3 months or sooner as needed.    Justice Britain. Tae Robak, DNP, NP-C     01/08/2023, 5:24 PM [address] Office [phone] Fax [phone]

## 2023-01-08 ENCOUNTER — Ambulatory Visit (INDEPENDENT_AMBULATORY_CARE_PROVIDER_SITE_OTHER): Payer: Medicare Other

## 2023-01-08 ENCOUNTER — Encounter: Payer: Self-pay | Admitting: Student

## 2023-01-08 ENCOUNTER — Ambulatory Visit: Payer: Medicare Other | Attending: Physician Assistant | Admitting: Student

## 2023-01-08 VITALS — BP 138/68 | HR 82 | Ht 68.0 in | Wt 180.0 lb

## 2023-01-08 DIAGNOSIS — E785 Hyperlipidemia, unspecified: Secondary | ICD-10-CM

## 2023-01-08 DIAGNOSIS — I480 Paroxysmal atrial fibrillation: Secondary | ICD-10-CM

## 2023-01-08 DIAGNOSIS — I32 Pericarditis in diseases classified elsewhere: Secondary | ICD-10-CM

## 2023-01-08 DIAGNOSIS — I10 Essential (primary) hypertension: Secondary | ICD-10-CM | POA: Diagnosis present

## 2023-01-08 DIAGNOSIS — I2510 Atherosclerotic heart disease of native coronary artery without angina pectoris: Secondary | ICD-10-CM | POA: Diagnosis not present

## 2023-01-08 DIAGNOSIS — Z720 Tobacco use: Secondary | ICD-10-CM

## 2023-01-08 NOTE — Progress Notes (Unsigned)
Enrolled for [employer] to mail a ZIO XT long term holter monitor to the patients address on file.   Dr. Ali Lowe to read.

## 2023-01-08 NOTE — Patient Instructions (Signed)
Medication Instructions:  Your physician recommends that you continue on your current medications as directed. Please refer to the Current Medication list given to you today.  *If you need a refill on your cardiac medications before your next appointment, please call your pharmacy*   Lab Work: TODAY:  CBC, TSH, & FREE T4  If you have labs (blood work) drawn today and your tests are completely normal, you will receive your results only by: [place] (if you have MyChart) OR A paper copy in the mail If you have any lab test that is abnormal or we need to change your treatment, we will call you to review the results.   Testing/Procedures: [place]- Long Term Monitor Instructions  Your physician has requested you wear a ZIO patch monitor for 14 days.  This is a single patch monitor. [employer] supplies one patch monitor per enrollment. Additional stickers are not available. Please do not apply patch if you will be having a Nuclear Stress Test,  Echocardiogram, Cardiac CT, MRI, or Chest Xray during the period you would be wearing the  monitor. The patch cannot be worn during these tests. You cannot remove and re-apply the  ZIO XT patch monitor.  Your ZIO patch monitor will be mailed 3 day USPS to your address on file. It may take 3-5 days  to receive your monitor after you have been enrolled.  Once you have received your monitor, please review the enclosed instructions. Your monitor  has already been registered assigning a specific monitor serial # to you.  Billing and Patient Assistance Program Information  We have supplied [employer] with any of your insurance information on file for billing purposes. [employer] offers a sliding scale Patient Assistance Program for patients that do not have  insurance, or whose insurance does not completely cover the cost of the ZIO monitor.  You must apply for the Patient Assistance Program to qualify for this discounted rate.  To apply, please call  [employer] at [phone], select option 4, select option 2, ask to apply for  Patient Assistance Program. Theodore Demark will ask your household income, and how many people  are in your household. They will quote your out-of-pocket cost based on that information.  [employer] will also be able to set up a 25-month interest-free payment plan if needed.  Applying the monitor   Shave hair from upper left chest.  Hold abrader disc by orange tab. Rub abrader in 40 strokes over the upper left chest as  indicated in your monitor instructions.  Clean area with 4 enclosed alcohol pads. Let dry.  Apply patch as indicated in monitor instructions. Patch will be placed under collarbone on left  side of chest with arrow pointing upward.  Rub patch adhesive wings for 2 minutes. Remove white label marked "1". Remove the white  label marked "2". Rub patch adhesive wings for 2 additional minutes.  While looking in a mirror, press and release button in center of patch. A small green light will  flash 3-4 times. This will be your only indicator that the monitor has been turned on.  Do not shower for the first 24 hours. You may shower after the first 24 hours.  Press the button if you feel a symptom. You will hear a small click. Record Date, Time and  Symptom in the Patient Logbook.  When you are ready to remove the patch, follow instructions on the last 2 pages of Patient  Logbook. Stick patch monitor onto the last page of  Patient Logbook.  Place Patient Logbook in the blue and white box. Use locking tab on box and tape box closed  securely. The blue and white box has prepaid postage on it. Please place it in the mailbox as  soon as possible. Your physician should have your test results approximately 7 days after the  monitor has been mailed back to [facility].  Call [place] at [phone] if you have questions regarding  your ZIO XT patch monitor. Call them immediately if you see an orange  light blinking on your  monitor.  If your monitor falls off in less than 4 days, contact our Monitor department at [phone].  If your monitor becomes loose or falls off after 4 days call [employer] at [phone] for  suggestions on securing your monitor    Follow-Up: At [facility], you and your health needs are our priority.  As part of our continuing mission to provide you with exceptional heart care, we have created designated Provider Care Teams.  These Care Teams include your primary Cardiologist (physician) and Advanced Practice Providers (APPs -  Physician Assistants and Nurse Practitioners) who all work together to provide you with the care you need, when you need it.  We recommend signing up for the patient portal called "MyChart".  Sign up information is provided on this After Visit Summary.  MyChart is used to connect with patients for Virtual Visits (Telemedicine).  Patients are able to view lab/test results, encounter notes, upcoming appointments, etc.  Non-urgent messages can be sent to your provider as well.   To learn more about what you can do with MyChart, go to [email or web address].    Your next appointment:   3 month(s)  Provider:   Early Osmond, MD     Other Instructions

## 2023-01-09 LAB — CBC
Hematocrit: 34.8 % — ABNORMAL LOW (ref 37.5–51.0)
Hemoglobin: 11.8 g/dL — ABNORMAL LOW (ref 13.0–17.7)
MCH: 32.3 pg (ref 26.6–33.0)
MCHC: 33.9 g/dL (ref 31.5–35.7)
MCV: 95 fL (ref 79–97)
Platelets: 211 10*3/uL (ref 150–450)
RBC: 3.65 x10E6/uL — ABNORMAL LOW (ref 4.14–5.80)
RDW: 13.1 % (ref 11.6–15.4)
WBC: 5.9 10*3/uL (ref 3.4–10.8)

## 2023-01-09 LAB — T4, FREE: Free T4: 0.79 ng/dL — ABNORMAL LOW (ref 0.82–1.77)

## 2023-01-09 LAB — TSH: TSH: 6.33 u[IU]/mL — ABNORMAL HIGH (ref 0.450–4.500)

## 2023-01-13 DIAGNOSIS — I32 Pericarditis in diseases classified elsewhere: Secondary | ICD-10-CM

## 2023-01-13 DIAGNOSIS — I10 Essential (primary) hypertension: Secondary | ICD-10-CM

## 2023-01-13 DIAGNOSIS — I480 Paroxysmal atrial fibrillation: Secondary | ICD-10-CM

## 2023-01-22 ENCOUNTER — Other Ambulatory Visit: Payer: Self-pay | Admitting: Internal Medicine

## 2023-02-16 ENCOUNTER — Telehealth: Payer: Self-pay | Admitting: *Deleted

## 2023-02-16 NOTE — Telephone Encounter (Signed)
CALLED PATIENT TO INFORM OF CT FOR 03-02-23- ARRIVAL TIME- 7:45 AM @ [facility], LABS TO BE DRAWN I-STAT IN RADIOLOGY, PRIOR TO SCAN , CT TO FOLLOW, PATIENT TO HAVE WATER ONLY- 4 HRS. PRIOR TO TEST, PATIENT TO FU WITH DR. Isidore Moos ON 03-06-23- 1:30 PM FOR LAB AND 2:00 PM FOR FU APPT.Marland Kitchen WITH DR. Isidore Moos FOR RESULTS, SPOKE WITH PATIENT AND HE IS AWARE OF THESE APPTS. AND THE INSTRUCTIONS

## 2023-02-20 NOTE — Progress Notes (Signed)
Brad [name] presents today for follow-up after completing radiation to his oropharynx and bilateral neck nodes on 05/25/2022. Pt will also receive CT results from 03-02-23 with this visit.   Pain issues, if any: *** Using a feeding tube?: *** Weight changes, if any: *** Swallowing issues, if any: *** Smoking or chewing tobacco? *** Using fluoride trays daily? *** Last ENT visit was on: Dr. Constance Holster 09-26-22 Other notable issues, if any: ***

## 2023-03-02 ENCOUNTER — Encounter (HOSPITAL_COMMUNITY): Payer: Self-pay

## 2023-03-02 ENCOUNTER — Ambulatory Visit (HOSPITAL_COMMUNITY)
Admission: RE | Admit: 2023-03-02 | Discharge: 2023-03-02 | Disposition: A | Discharge status: Home / Self Care | Payer: Medicare Other | Source: Ambulatory Visit | Attending: Radiation Oncology | Admitting: Radiation Oncology

## 2023-03-02 DIAGNOSIS — C770 Secondary and unspecified malignant neoplasm of lymph nodes of head, face and neck: Secondary | ICD-10-CM | POA: Diagnosis present

## 2023-03-02 MED ORDER — SODIUM CHLORIDE (PF) 0.9 % IJ SOLN
INTRAMUSCULAR | Status: AC
  Filled 2023-03-02: qty 50

## 2023-03-02 MED ORDER — IOHEXOL 300 MG/ML  SOLN
75.0000 mL | Freq: Once | INTRAMUSCULAR | Status: AC | PRN
  Administered 2023-03-02: 75 mL via INTRAVENOUS

## 2023-03-04 NOTE — Progress Notes (Unsigned)
Cardiology Clinic Note   Date: 03/04/2023 ID: Brad [name], DOB 07-17-1951, MRN [id number]  Primary Cardiologist:  Orbie Pyo, MD  Patient Profile    Brad [name] is a 72 y.o. male who presents to the clinic today for ***  Past medical history significant for: Nonobstructive CAD. LHC 12/29/2022: Mild nonobstructive CAD without evidence of spasm, dissection, or bridging.  Recommend medical therapy for acute pericarditis. Bradycardia/sinus pause/PAF. 2-week ZIO 02/05/2023: 11 runs of supraventricular tachycardia fastest lasting 5 beats with max rate 190 bpm, longest lasting 11 beats with average rate 110 bpm.  A-fib occurred (4% burden), ranging from 83 to 185 bpm, longest 5 hours 42 minutes with average rate 130 bpm.  No sustained ventricular tachyarrhythmias or bradyarrhythmias.  No patient triggered events. Aortic dilatation. Pericarditis. Echo 12/30/2022: EF 55 to 60%.  Mild LAE.  Borderline aortic root dilatation 38 mm, mild ascending aortic dilatation 40 mm.  Dilated IVC.  RA pressure 8 mmHg.  No pericardial effusion. Hypertension. Hypercholesterolemia. Lipid panel 11/28/2022: LDL 96, HDL 104, TG 59, total 211. COPD. 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 S/p radiation and radical neck dissection on the left April 2023. Tobacco abuse.   History of Present Illness    Brad [name] was first evaluated by cardiology on 12/29/2022 during hospitalization for chest pain.  Patient presented to [facility] ER with chest tightness radiating to jaw upon awakening on 12/29/2022 that did not improve with antacids.  Pain actually started the night previous and improved with antacids to the point he was able to ride his bicycle without increased discomfort.  Initial EKG showed slight ST elevation in lead II.  Repeat EKG showed slightly increased ST elevation in lead II with diffuse ST elevation suspicious for pericarditis.  He received aspirin 324 mg and 1 SL NTG but  developed hypotension and bradycardia.  A third EKG showed more dramatic changes in inferior leads so a STEMI code was called and patient was transferred to [facility] for emergent cardiac catheterization. However, left heart catheterization showed only mild nonobstructive CAD (outlined above). Therefore, symptoms felt related to pericarditis and he was treated as such with ibuprofen, colchcine, PPI. Patient's hospital course was complicated by bradycardia with 2 to 3-second pauses on 12/30/2022.  This was thought to be related to a vagal event and patient was asymptomatic.  In the early morning patient was noted to be in A-fib with RVR with heart rates between 110 and 130.  Soft BP precluded use of rate controlling agents.  He was started on amiodarone drip and transitioned from heparin to p.o. Eliquis.  Patient converted to NSR later in the afternoon.  Patient discharged 12/31/2022. Not started on oral amiodarone at dc due to colchicine rx.   Patient was last seen in the office by me on 01/08/2023.  At that time he reported episodes of increased heart rate into the the 130s captured on his Apple Watch.  It did not appear he had started the ECG tracing feature on his watch.  Discussed sleep study but he wanted to discuss this with his wife and stated "I will not wear a mask."  2-week ZIO showed 4% A-fib burden (details above).  Patient was instructed to start metoprolol 12.5 mg but he requested office visit to discuss this first.  Today, patient ***  PCP for TSH?    Pericarditis.  Patient started on treatment during hospitalization 12/30/2022.  He reports he is tolerating medications*** He will continue  colchicine x 3 months and PPI for 1 month. Nonobstructive CAD.  LHC February 2024 showed nonobstructive CAD without evidence of spasm, dissection or bridging.  He is exercising at the gym several times a week without chest pain or dyspnea ***. Rosuvastatin was temporarily paused while on colchicine. Not on ASA  due to concomitant Eliquis (and nonobstructive disease). PAF.  Onset 12/30/2022.  Converted to NSR on IV amiodarone. PO amiodarone not started secondary to colchicine.  2-week ZIO showed 4% A-fib burden.  Patient*** Hyperlipidemia.  LDL January 2024 96, not at goal.  Rosuvastatin currently paused secondary to colchicine for pericarditis.  Will consider resuming after colchicine treatment is completed. Tobacco abuse. Patient continues to smoke half a pack of cigarettes per day. He is interested in quitting. ***  ROS: All other systems reviewed and are otherwise negative except as noted in History of Present Illness.  Studies Reviewed    ECG personally reviewed by me today: ***  No significant changes from ***  Risk Assessment/Calculations    {Does this patient have ATRIAL FIBRILLATION?:[phone]} No BP recorded.  {Refresh Note OR Click here to enter BP  :1}***        Physical Exam    VS:  There were no vitals taken for this visit. , BMI There is no height or weight on file to calculate BMI.  GEN: Well nourished, well developed, in no acute distress. Neck: No JVD or carotid bruits. Cardiac: *** RRR. No murmurs. No rubs or gallops.   Respiratory:  Respirations regular and unlabored. Clear to auscultation without rales, wheezing or rhonchi. GI: Soft, nontender, nondistended. Extremities: Radials/DP/PT 2+ and equal bilaterally. No clubbing or cyanosis. No edema ***  Skin: Warm and dry, no rash. Neuro: Strength intact.  Assessment & Plan   ***  Disposition: ***     {Are you ordering a CV Procedure (e.g. stress test, cath, DCCV, TEE, etc)?   Press F2        :[id number]}   Signed, Etta Grandchild. Amanda Pote, DNP, NP-C

## 2023-03-05 ENCOUNTER — Other Ambulatory Visit: Payer: Self-pay

## 2023-03-06 ENCOUNTER — Encounter: Payer: Self-pay | Admitting: Student

## 2023-03-06 ENCOUNTER — Other Ambulatory Visit: Payer: Self-pay

## 2023-03-06 ENCOUNTER — Telehealth: Payer: Self-pay | Admitting: *Deleted

## 2023-03-06 ENCOUNTER — Ambulatory Visit
Admission: RE | Admit: 2023-03-06 | Discharge: 2023-03-06 | Disposition: A | Discharge status: Home / Self Care | Payer: Medicare Other | Source: Ambulatory Visit | Attending: Radiation Oncology | Admitting: Radiation Oncology

## 2023-03-06 ENCOUNTER — Encounter: Payer: Self-pay | Admitting: Radiation Oncology

## 2023-03-06 ENCOUNTER — Ambulatory Visit: Payer: Medicare Other | Attending: Student | Admitting: Student

## 2023-03-06 VITALS — BP 122/78 | HR 84 | Ht 68.0 in | Wt 172.4 lb

## 2023-03-06 VITALS — BP 133/81 | HR 113 | Temp 98.0°F | Resp 20 | Wt 173.2 lb

## 2023-03-06 DIAGNOSIS — R682 Dry mouth, unspecified: Secondary | ICD-10-CM | POA: Insufficient documentation

## 2023-03-06 DIAGNOSIS — Z79890 Hormone replacement therapy (postmenopausal): Secondary | ICD-10-CM | POA: Insufficient documentation

## 2023-03-06 DIAGNOSIS — I4891 Unspecified atrial fibrillation: Secondary | ICD-10-CM | POA: Insufficient documentation

## 2023-03-06 DIAGNOSIS — I700 Atherosclerosis of aorta: Secondary | ICD-10-CM | POA: Insufficient documentation

## 2023-03-06 DIAGNOSIS — Z7901 Long term (current) use of anticoagulants: Secondary | ICD-10-CM | POA: Insufficient documentation

## 2023-03-06 DIAGNOSIS — I319 Disease of pericardium, unspecified: Secondary | ICD-10-CM | POA: Diagnosis not present

## 2023-03-06 DIAGNOSIS — Z87891 Personal history of nicotine dependence: Secondary | ICD-10-CM | POA: Insufficient documentation

## 2023-03-06 DIAGNOSIS — I2510 Atherosclerotic heart disease of native coronary artery without angina pectoris: Secondary | ICD-10-CM | POA: Insufficient documentation

## 2023-03-06 DIAGNOSIS — E785 Hyperlipidemia, unspecified: Secondary | ICD-10-CM

## 2023-03-06 DIAGNOSIS — Z79899 Other long term (current) drug therapy: Secondary | ICD-10-CM | POA: Insufficient documentation

## 2023-03-06 DIAGNOSIS — Z720 Tobacco use: Secondary | ICD-10-CM | POA: Diagnosis present

## 2023-03-06 DIAGNOSIS — Z923 Personal history of irradiation: Secondary | ICD-10-CM | POA: Insufficient documentation

## 2023-03-06 DIAGNOSIS — I82C12 Acute embolism and thrombosis of left internal jugular vein: Secondary | ICD-10-CM | POA: Insufficient documentation

## 2023-03-06 DIAGNOSIS — R5381 Other malaise: Secondary | ICD-10-CM | POA: Diagnosis present

## 2023-03-06 DIAGNOSIS — C770 Secondary and unspecified malignant neoplasm of lymph nodes of head, face and neck: Secondary | ICD-10-CM | POA: Insufficient documentation

## 2023-03-06 DIAGNOSIS — R5383 Other fatigue: Secondary | ICD-10-CM | POA: Diagnosis present

## 2023-03-06 DIAGNOSIS — I480 Paroxysmal atrial fibrillation: Secondary | ICD-10-CM

## 2023-03-06 DIAGNOSIS — C4442 Squamous cell carcinoma of skin of scalp and neck: Secondary | ICD-10-CM | POA: Insufficient documentation

## 2023-03-06 LAB — TSH: TSH: 4.987 u[IU]/mL — ABNORMAL HIGH (ref 0.350–4.500)

## 2023-03-06 LAB — T4, FREE: Free T4: 0.78 ng/dL (ref 0.61–1.12)

## 2023-03-06 MED ORDER — METOPROLOL SUCCINATE ER 25 MG PO TB24: 12.5000 mg | ORAL_TABLET | Freq: Every day | ORAL | 3 refills | Status: DC

## 2023-03-06 NOTE — Progress Notes (Signed)
Oncology Nurse Navigator Documentation   I met with Mr. Herschberger before and during his follow up with Dr. Basilio Cairo today. He is doing well, maintaining his weight. He is active going to the gym several days a week. He will see Dr. Basilio Cairo in 7 months and a notice was sent to Dr. Pollyann Kennedy to have him scheduled in 3 months. Successful transmission of fax was received. Mr. Dastrup knows to call me if he has any needs in the future. '  Hedda Slade RN, BSN, OCN Head & Neck Oncology Nurse Navigator [facility] Phone # [phone]  Fax # [phone]

## 2023-03-06 NOTE — Telephone Encounter (Signed)
CALLED PATIENT TO REMIND OF LAB AND FU FOR 03/06/23, LVM FOR A RETURN CALL

## 2023-03-06 NOTE — Progress Notes (Signed)
Radiation Oncology         [phone] ________________________________  Name: Brad [name] MRN: [id number]  Date: 03/06/2023  DOB: 05-May-1951  Follow-Up Visit Note  CC: Brad Bussing, MD  Brad Bussing, MD  Diagnosis and Prior Radiotherapy:       ICD-10-CM   1. Squamous cell carcinoma of head and neck  C44.42     2. 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  C77.0        Cancer Staging  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 Staging form: Pharynx - HPV-Mediated Oropharynx, AJCC 8th Edition - Pathologic stage from 03/31/2022: Stage I (pT0, pN1, cM0, p16+) - Signed by Lonie Peak, MD on 03/31/2022 Stage prefix: Initial diagnosis   Radiation Treatment Dates: 04/12/2022 through 05/25/2022 Site Technique Total Dose (Gy) Dose per Fx (Gy) Completed Fx Beam Energies  Neck: HN_orophar IMRT 60/60 2 30/30 6X   CHIEF COMPLAINT:  Here for follow-up and surveillance of throat cancer  Narrative:  Brad [name] presents today for follow-up after completing radiation to his oropharynx and bilateral neck nodes on 05/25/2022, and to review CT scan results from 03/02/23.  CT scan showed posttreatment changes in the neck without evidence of recurrent disease or pathologic lymphadenopathy.   Pain issues, if any: Denies any mouth or throat pain Using a feeding tube?: N/A Weight changes, if any:     Wt Readings from Last 3 Encounters:  03/06/23 173 lb 3.2 oz (78.6 kg)  03/06/23 172 lb 6.4 oz (78.2 kg)  01/08/23 180 lb (81.6 kg)    Swallowing issues, if any: Denies--reports he can eat and drink a wide variety, though he does still struggle with altered sense of taste  Smoking or chewing tobacco? None Using fluoride trays daily? N/A Last ENT visit was on: Dr. Pollyann Kennedy 09-26-22 --Physical Exam:  Healthy-appearing gentleman in no distress. Breathing and voice are clear. Ear canals drums and middle ears look healthy and clear. Nasal exam is unremarkable. Oral cavity and pharynx looks healthy and  clear. No masses identified. Indirect exam of the base of tongue, hypopharynx and larynx is all normal. Normal amount of supraglottic edema post radiation. Some fibrosis of the neck also postradiation. No masses palpable.  --Impression & Plans:  Stable post treatment. Recheck 3 months. I have instructed him to contact me in a few weeks if his weight continues to decline.    Other notable issues, if any: Reports he had cardiac issues earlier this year, but reports he's doing well and got a good report from his cardiologist NP earlier today.         ALLERGIES:  has No Known Allergies.  Meds: Current Outpatient Medications  Medication Sig Dispense Refill   rosuvastatin (CRESTOR) 20 MG tablet Take 1 tablet by mouth daily. Per patient has to wait to start until finishes colchicine prescription     albuterol (PROVENTIL HFA;VENTOLIN HFA) 108 (90 BASE) MCG/ACT inhaler Inhale 2 puffs into the lungs every 6 (six) hours as needed for shortness of breath. shortness of breath     apixaban (ELIQUIS) 5 MG TABS tablet Take 1 tablet (5 mg total) by mouth 2 (two) times daily. 60 tablet 5   Budeson-Glycopyrrol-Formoterol (BREZTRI AEROSPHERE) 160-9-4.8 MCG/ACT AERO Inhale 2 puffs into the lungs in the morning and at bedtime.     Cholecalciferol (VITAMIN D3 PO) Take 1 tablet by mouth in the morning.     colchicine 0.6 MG tablet Take 1 tablet (0.6 mg total) by mouth 2 (two) times daily.  60 tablet 2   Cyanocobalamin (VITAMIN B-12 PO) Take 1 tablet by mouth in the morning.     metoprolol succinate (TOPROL XL) 25 MG 24 hr tablet Take 0.5 tablets (12.5 mg total) by mouth daily. 45 tablet 3   Multiple Vitamin (MULTIVITAMIN WITH MINERALS) TABS Take 1 tablet by mouth in the morning.     pantoprazole (PROTONIX) 40 MG tablet TAKE 1 TABLET BY MOUTH EVERY DAY 90 tablet 1   sildenafil (VIAGRA) 100 MG tablet Take 100 mg by mouth daily as needed for erectile dysfunction.     tamsulosin (FLOMAX) 0.4 MG CAPS capsule Take 0.4 mg by  mouth in the morning.     Testosterone 20.25 MG/ACT (1.62%) GEL Apply 1 Pump topically daily.     vitamin E 400 UNIT capsule Take 400 Units by mouth in the morning.     No current facility-administered medications for this encounter.    Physical Findings: The patient is in no acute distress. Patient is alert and oriented. Wt Readings from Last 3 Encounters:  03/06/23 173 lb 3.2 oz (78.6 kg)  03/06/23 172 lb 6.4 oz (78.2 kg)  01/08/23 180 lb (81.6 kg)    weight is 173 lb 3.2 oz (78.6 kg). His temperature is 98 F (36.7 C). His blood pressure is 133/81 and his pulse is 113 (abnormal). His respiration is 20 and oxygen saturation is 96%. .  General: Alert and oriented, in no acute distress HEENT: Head is normocephalic. Extraocular movements are intact. Mucosa negative for tumor in upper throat/mouth Neck: Neck is notable for no palpable masses Cardiac: Heart regular in rate and rhythm; no murmurs, clicks, or gallops Pulm: CTA in all lung fields Skin: smooth, intact over neck; well healed from radiation Lymphatics: see Neck Exam Psychiatric: Judgment and insight are intact. Affect is appropriate.   Lab Findings: Lab Results  Component Value Date   WBC 5.9 01/08/2023   HGB 11.8 (L) 01/08/2023   HCT 34.8 (L) 01/08/2023   MCV 95 01/08/2023   PLT 211 01/08/2023    Lab Results  Component Value Date   TSH 4.987 (H) 03/06/2023    Radiographic Findings: CT Chest W Contrast  Result Date: 03/02/2023 CLINICAL DATA:  Head and neck cancer, left neck lymphoma, XRT complete * Tracking Code: BO * EXAM: CT CHEST WITH CONTRAST TECHNIQUE: Multidetector CT imaging of the chest was performed during intravenous contrast administration. RADIATION DOSE REDUCTION: This exam was performed according to the departmental dose-optimization program which includes automated exposure control, adjustment of the mA and/or kV according to patient size and/or use of iterative reconstruction technique. CONTRAST:   75mL OMNIPAQUE IOHEXOL 300 MG/ML  SOLN COMPARISON:  08/25/2022 FINDINGS: Cardiovascular: Aortic atherosclerosis. Normal heart size. Three-vessel coronary artery calcifications no pericardial effusion. Mediastinum/Nodes: No enlarged mediastinal, hilar, or axillary lymph nodes. Thyroid gland, trachea, and esophagus demonstrate no significant findings. Lungs/Pleura: Lungs are clear. No pleural effusion or pneumothorax. Upper Abdomen: No acute abnormality. Musculoskeletal: No chest wall abnormality. No acute osseous findings. Disc degenerative disease and bridging osteophytosis throughout the thoracic spine, in keeping with DISH. IMPRESSION: 1. No evidence of lymphadenopathy or metastatic disease in the chest. 2. Coronary artery disease. Aortic Atherosclerosis (ICD10-I70.0). Electronically Signed   By: Jearld Lesch M.D.   On: 03/02/2023 10:12   CT Soft Tissue Neck W Contrast  Result Date: 03/02/2023 CLINICAL DATA:  History of squamous cell carcinoma with metastatic left-sided adenopathy. EXAM: CT NECK WITH CONTRAST TECHNIQUE: Multidetector CT imaging of the neck was performed using  the standard protocol following the bolus administration of intravenous contrast. RADIATION DOSE REDUCTION: This exam was performed according to the departmental dose-optimization program which includes automated exposure control, adjustment of the mA and/or kV according to patient size and/or use of iterative reconstruction technique. CONTRAST:  75mL OMNIPAQUE IOHEXOL 300 MG/ML  SOLN COMPARISON:  CT neck 08/25/2022 FINDINGS: Pharynx and larynx: Generalized mucosal edema in the pharyngeal mucosa is overall similar to slightly progressed since the prior study from 08/25/2022. The parapharyngeal spaces are clear. There is no abnormal enhancement, mass lesion, or fluid collection. Slight asymmetry of the vocal cords is unchanged. Salivary glands: The parotid and right submandibular glands are unremarkable. The left submandibular gland is  atrophic, unchanged. Thyroid: Unremarkable. Lymph nodes: Posttreatment changes are noted in the left neck with associated ill-defined soft tissue thickening. There is no pathologic lymphadenopathy in the neck on the current study. 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 The jugular bulb remains patent. There is calcified plaque of the carotid bifurcations bilaterally. The major vasculature of the neck is otherwise unremarkable. Limited intracranial: Unremarkable. Visualized orbits: Unremarkable. Mastoids and visualized paranasal sinuses: Clear. Skeleton: There is no acute osseous abnormality or suspicious osseous lesion. Upper chest: Assessed on the separately dictated CT chest. Other: None. IMPRESSION: Posttreatment changes in the neck without evidence of recurrent disease or pathologic lymphadenopathy. NI-RADS 1. Electronically Signed   By: Lesia Hausen M.D.   On: 03/02/2023 09:54   LONG TERM MONITOR (3-14 DAYS)  Result Date: 02/05/2023 Patch Wear Time:  13 days and 23 hours (2024-02-24T15:10:45-0500 to 2024-03-09T14:53:06-499) Patient had a min HR of 57 bpm, max HR of 190 bpm, and avg HR of 84 bpm. Predominant underlying rhythm was Sinus Rhythm. EVENTS: 11 Supraventricular Tachycardia runs occurred, the run with the fastest interval lasting 5 beats with a max rate of 190 bpm, the longest lasting 11 beats with an avg rate of 110 bpm. Atrial Fibrillation occurred (4% burden), ranging from 83-185 bpm (avg of 129 bpm), the longest lasting 5 hours 42 mins with an avg rate of 130 bpm. Isolated SVEs were rare (<1.0%), SVE Couplets were rare (<1.0%), and SVE Triplets were rare (<1.0%). Isolated VEs were occasional (1.8%, [id number]), VE Couplets were rare (<1.0%, 112), and no VE Triplets were present. Ventricular Bigeminy and Trigeminy were present. No sustained ventricular tachyarrhythmias or bradyarrhythmias were detected. No patient triggered events.    I personally reviewed his images.   Impression/Plan:    1) Head and Neck Cancer Status: He is in remission. Most recent CT scan is favorable.   I personally reviewed his images of his neck and his chest without evidence of disease  2) Nutritional Status: No active issues. Patient states food still does not taste like it used to. His weight is lower than baseline but he is eating a wide variety of food.    Wt Readings from Last 3 Encounters:  03/06/23 173 lb 3.2 oz (78.6 kg)  03/06/23 172 lb 6.4 oz (78.2 kg)  01/08/23 180 lb (81.6 kg)   3) Risk Factors: The patient has been educated about risk factors including alcohol and tobacco abuse; they understand that avoidance of alcohol and tobacco is important to prevent recurrences as well as other cancers. Patient previously quit smoking with Wellbutrin and nicotine patches. He states this method helped him quit until he finished his prescription and then he resumed. Wellbutrin interacts with his metoprolol. Patient is going to see his  cardiologist in a couple weeks. He is going to talk to his cardiologist about nicotine patches as he is concerned that these might interact with his new heart medications as well.   4) Swallowing: No issues  5) Dental: Last saw his dentist one year ago. Encouraged to continue regular followup with dentistry, and dental hygiene including fluoride rinses. I recommended seeing his dentist as soon as possible. Patient states he has dry mouth that is worse at night. I also encouraged him to try biotene gels instead of mouthwash as they can be more effective for some patients.   6) Thyroid function: Checking TSH and free T4 today:  TSH is slightly elevated but Free T4 is within normal limits per our lab.  He is getting this monitored as well in cardiology.    Lab Results  Component Value Date   TSH 4.987 (H) 03/06/2023  FREE T4 0.78   7) Patient will see Dr. Pollyann Kennedy in 3 months and see me back for routine follow up in  7 months.   On date of service, in total, I spent 30 minutes on this encounter. Patient was seen in person.    _____________________________________   Joyice Faster, PA   Lonie Peak, MD

## 2023-03-06 NOTE — Patient Instructions (Signed)
Medication Instructions:  Your physician has recommended you make the following change in your medication:  START: Toprol XL 12.5mg  daily.  *If you need a refill on your cardiac medications before your next appointment, please call your pharmacy*   Lab Work: NONE If you have labs (blood work) drawn today and your tests are completely normal, you will receive your results only by: MyChart Message (if you have MyChart) OR A paper copy in the mail If you have any lab test that is abnormal or we need to change your treatment, we will call you to review the results.   Testing/Procedures: NONE   Follow-Up: At [facility], you and your health needs are our priority.  As part of our continuing mission to provide you with exceptional heart care, we have created designated Provider Care Teams.  These Care Teams include your primary Cardiologist (physician) and Advanced Practice Providers (APPs -  Physician Assistants and Nurse Practitioners) who all work together to provide you with the care you need, when you need it.  We recommend signing up for the patient portal called "MyChart".  Sign up information is provided on this After Visit Summary.  MyChart is used to connect with patients for Virtual Visits (Telemedicine).  Patients are able to view lab/test results, encounter notes, upcoming appointments, etc.  Non-urgent messages can be sent to your provider as well.   To learn more about what you can do with MyChart, go to [email or web address].    Your next appointment:   Please Keep schedule follow up appointment.

## 2023-03-07 ENCOUNTER — Telehealth: Payer: Self-pay

## 2023-03-07 NOTE — Telephone Encounter (Signed)
Rn called pt to review lab results with patient per Dr. Basilio Cairo request. Pt TSH was elevated (though better than one month ago), but his Free T4 was normal. Rn educated pt that we would monitor labs for now and no new prescriptions would be needed at this time. Pt had no questions on this call.

## 2023-03-14 ENCOUNTER — Ambulatory Visit: Payer: Medicare Other

## 2023-03-30 NOTE — Progress Notes (Signed)
Cardiology Office Note:    Date:  04/02/2023   ID:  Brad [name], DOB 12-Nov-1951, MRN [id number]  PCP:  Darrow Bussing, MD   [facility] Providers Cardiologist:  Alverda Skeans, MD Referring MD: Darrow Bussing, MD   Chief Complaint/Reason for Referral: Cardiology follow-up  ASSESSMENT:    1. Pericarditis, unspecified chronicity, unspecified type   2. PAF (paroxysmal atrial fibrillation) (HCC)   3. Nonobstructive atherosclerosis of coronary artery   4. Hyperlipidemia LDL goal <70   5. Primary hypertension   6. Tobacco abuse   7. Aortic atherosclerosis (HCC)     PLAN:    In order of problems listed above: 1.  Pericarditis: Will stop colchicine now after a total of 3 months of therapy. 2.  Paroxysmal atrial fibrillation: Continue anticoagulation. 3.  Coronary artery disease: Continue Eliquis in lieu of aspirin and will resume Crestor 20 mg which was on hold due colchicine treatment. 4.  Hyperlipidemia: Goal LDL < 70.  Resume Crestor and check lipid panel, LFTs in 2 months. 5.  Hypertension:  Well controlled today. 6.  Tobacco abuse:  Patient to reach out to Dr. Basilio Cairo regarding Wellbutrin; patient is on a very low dose of Toprol and I do not expect that the interaction increasing Toprol effect will be clinically important. 7.  Aortic atherosclerosis: Continue Eliquis in lieu of aspirin, resume Crestor, and pursue aggressive blood pressure control; avoid nicotine.             Dispo:  Return in about 6 months (around 10/03/2023).      Medication Adjustments/Labs and Tests Ordered: Current medicines are reviewed at length with the patient today.  Concerns regarding medicines are outlined above.  The following changes have been made:     Labs/tests ordered: No orders of the defined types were placed in this encounter.   Medication Changes: No orders of the defined types were placed in this encounter.    Current medicines are reviewed at length with the patient  today.  The patient does not have concerns regarding medicines.   History of Present Illness:    FOCUSED PROBLEM LIST:   1.  Pericarditis February 2024; patient developed ST elevations and was taken to emergency coronary angiography which demonstrated mild obstructive coronary artery disease 2.  Paroxysmal atrial fibrillation on Eliquis; CV 2 score of 3. 3.  Hyperlipidemia; low LP(a) 4.  Squamous cell carcinoma status post radiation and radical neck dissection in April 2023 5.  COPD with ongoing tobacco abuse 6.  Aortic atherosclerosis and coronary cardiac calcification on chest CT 2024  The patient is a 72 y.o. male with the indicated medical history here for routine cardiology follow-up.  Following discharge from the hospital in February due to acute pericarditis he was seen a few weeks later.  He was without chest pain or shortness of breath.  He was continuing to smoke cigarettes at that time.  He was counseled on NSAID taper with a plan to continue colchicine for total of 3 months.  Monitor and labs were also obtained; monitor demonstrated paroxysmal atrial fibrillation and Eliquis was prescribed.  The patient is doing very well.  He exercises on a routine basis 3 times a week without any limitations.  He is tolerating his anticoagulation well.  He is finished his colchicine course.  He has had no recurrence of pericarditis.  He has not required any emergency room visits or hospitalizations.  Of note he is smoking a bit.  He did speak to his radiation  oncologist Dr. Basilio Cairo since he was on Wellbutrin before and they are thinking about restarting this.  There was a concern about its drug drug interaction with Toprol.          Current Medications: Current Meds  Medication Sig   albuterol (PROVENTIL HFA;VENTOLIN HFA) 108 (90 BASE) MCG/ACT inhaler Inhale 2 puffs into the lungs every 6 (six) hours as needed for shortness of breath. shortness of breath   apixaban (ELIQUIS) 5 MG TABS tablet  Take 1 tablet (5 mg total) by mouth 2 (two) times daily.   Budeson-Glycopyrrol-Formoterol (BREZTRI AEROSPHERE) 160-9-4.8 MCG/ACT AERO Inhale 2 puffs into the lungs in the morning and at bedtime.   Cholecalciferol (VITAMIN D3 PO) Take 1 tablet by mouth in the morning.   Cyanocobalamin (VITAMIN B-12 PO) Take 1 tablet by mouth in the morning.   metoprolol succinate (TOPROL XL) 25 MG 24 hr tablet Take 0.5 tablets (12.5 mg total) by mouth daily.   Multiple Vitamin (MULTIVITAMIN WITH MINERALS) TABS Take 1 tablet by mouth in the morning.   pantoprazole (PROTONIX) 40 MG tablet TAKE 1 TABLET BY MOUTH EVERY DAY   rosuvastatin (CRESTOR) 20 MG tablet Take 1 tablet by mouth daily. Per patient has to wait to start until finishes colchicine prescription   sildenafil (VIAGRA) 100 MG tablet Take 100 mg by mouth daily as needed for erectile dysfunction.   tamsulosin (FLOMAX) 0.4 MG CAPS capsule Take 0.4 mg by mouth in the morning.   Testosterone 20.25 MG/ACT (1.62%) GEL Apply 1 Pump topically daily.   vitamin E 400 UNIT capsule Take 400 Units by mouth in the morning.     Allergies:    Patient has no known allergies.   Social History:   Social History   Tobacco Use   Smoking status: Light Smoker    Years: 30    Types: Cigarettes   Smokeless tobacco: Never   Tobacco comments:    smokes less than a pack a week  Vaping Use   Vaping Use: Never used  Substance Use Topics   Alcohol use: Yes    Alcohol/week: 7.0 standard drinks of alcohol    Types: 7 Shots of liquor per week    Comment: weekly    Drug use: No     Family Hx: Family History  Problem Relation Age of Onset   Cancer Brother    Colon cancer Neg Hx    Esophageal cancer Neg Hx    Rectal cancer Neg Hx    Stomach cancer Neg Hx      Review of Systems:   Please see the history of present illness.    All other systems reviewed and are negative.     EKGs/Labs/Other Test Reviewed:    EKG:  EKG performed February 2024 that I personally  reviewed demonstrates sinus rhythm with nonspecific ST and T wave abnormalities  Prior CV studies:  Cardiac Studies & Procedures   CARDIAC CATHETERIZATION  CARDIAC CATHETERIZATION 12/29/2022  Narrative 1.  Mild obstructive coronary artery disease with no evidence of spasm, dissection, or bridging. 2.  Preserved LV function with no wall motion abnormalities with LVEDP of 17 mmHg.  Recommendation: Initiate medical therapy for acute pericarditis and obtain echocardiogram.  Start atorvastatin 20 mg for mild obstructive coronary artery disease and NSAIDs for pericarditis (instead of ASA 81mg ) acutely.  Consider discharge tomorrow.  Findings Coronary Findings Diagnostic  Dominance: Right  Left Anterior Descending  First Diagonal Branch The vessel exhibits minimal luminal irregularities.  Left Circumflex The  vessel exhibits minimal luminal irregularities.  Right Coronary Artery The vessel exhibits minimal luminal irregularities.  Intervention  No interventions have been documented.     ECHOCARDIOGRAM  ECHOCARDIOGRAM COMPLETE 12/30/2022  Narrative ECHOCARDIOGRAM REPORT    Patient Name:   KEAGEN ADRAGNA Date of Exam: 12/30/2022 Medical Rec #:  [id number]     Height:       68.0 in Accession #:    [id number]    Weight:       175.0 lb Date of Birth:  07/22/1951     BSA:          1.931 m Patient Age:    71 years      BP:           100/74 mmHg Patient Gender: M             HR:           86 bpm. Exam Location:  Inpatient  Procedure: 2D Echo, Cardiac Doppler, Color Doppler and Intracardiac Opacification Agent  Indications:    Chest Pain R07.9  History:        Patient has no prior history of Echocardiogram examinations. Pericarditis, Pulmonary HTN, Signs/Symptoms:Chest Pain; Risk Factors:Current Smoker, Hypertension and Dyslipidemia.  Sonographer:    Aron Baba Referring Phys: [id number] Orbie Pyo   Sonographer Comments: Technically difficult study due to poor echo  windows. Image acquisition challenging due to patient body habitus and Image acquisition challenging due to respiratory motion. IMPRESSIONS   1. Left ventricular ejection fraction, by estimation, is 55 to 60%. The left ventricle has normal function. The left ventricle has no regional wall motion abnormalities. Left ventricular diastolic function could not be evaluated. 2. Right ventricular systolic function is low normal. The right ventricular size is normal. There is normal pulmonary artery systolic pressure. The estimated right ventricular systolic pressure is 14.4 mmHg. 3. Left atrial size was mildly dilated. 4. The mitral valve is normal in structure. No evidence of mitral valve regurgitation. No evidence of mitral stenosis. 5. The aortic valve is tricuspid. Aortic valve regurgitation is not visualized. No aortic stenosis is present. 6. Aortic dilatation noted. There is borderline dilatation of the aortic root, measuring 38 mm. There is mild dilatation of the ascending aorta, measuring 40 mm. 7. The inferior vena cava is dilated in size with >50% respiratory variability, suggesting right atrial pressure of 8 mmHg.  Comparison(s): No prior Echocardiogram.  FINDINGS Left Ventricle: Left ventricular ejection fraction, by estimation, is 55 to 60%. The left ventricle has normal function. The left ventricle has no regional wall motion abnormalities. Definity contrast agent was given IV to delineate the left ventricular endocardial borders. The left ventricular internal cavity size was normal in size. There is no left ventricular hypertrophy. Left ventricular diastolic function could not be evaluated due to nondiagnostic images. Left ventricular diastolic function could not be evaluated.  Right Ventricle: The right ventricular size is normal. No increase in right ventricular wall thickness. Right ventricular systolic function is low normal. There is normal pulmonary artery systolic pressure. The  tricuspid regurgitant velocity is 1.26 m/s, and with an assumed right atrial pressure of 8 mmHg, the estimated right ventricular systolic pressure is 14.4 mmHg.  Left Atrium: Left atrial size was mildly dilated.  Right Atrium: Right atrial size was normal in size.  Pericardium: There is no evidence of pericardial effusion.  Mitral Valve: The mitral valve is normal in structure. No evidence of mitral valve regurgitation. No evidence of mitral valve stenosis.  Tricuspid Valve: The tricuspid valve is normal in structure. Tricuspid valve regurgitation is not demonstrated. No evidence of tricuspid stenosis.  Aortic Valve: The aortic valve is tricuspid. Aortic valve regurgitation is not visualized. No aortic stenosis is present.  Pulmonic Valve: The pulmonic valve was not well visualized. Pulmonic valve regurgitation is not visualized. No evidence of pulmonic stenosis.  Aorta: Aortic dilatation noted. There is borderline dilatation of the aortic root, measuring 38 mm. There is mild dilatation of the ascending aorta, measuring 40 mm.  Venous: The inferior vena cava is dilated in size with greater than 50% respiratory variability, suggesting right atrial pressure of 8 mmHg.  IAS/Shunts: No atrial level shunt detected by color flow Doppler.   LEFT VENTRICLE PLAX 2D LVIDd:         4.30 cm   Diastology LVIDs:         3.20 cm   LV e' medial:    10.10 cm/s LV PW:         1.10 cm   LV E/e' medial:  6.8 LV IVS:        0.90 cm   LV e' lateral:   13.20 cm/s LVOT diam:     1.90 cm   LV E/e' lateral: 5.2 LV SV:         58 LV SV Index:   30 LVOT Area:     2.84 cm   RIGHT VENTRICLE RV S prime:     9.17 cm/s TAPSE (M-mode): 1.4 cm  LEFT ATRIUM           Index        RIGHT ATRIUM           Index LA diam:      3.80 cm 1.97 cm/m   RA Area:     17.10 cm LA Vol (A2C): 63.0 ml 32.63 ml/m  RA Volume:   47.70 ml  24.70 ml/m LA Vol (A4C): 67.7 ml 35.06 ml/m AORTIC VALVE LVOT Vmax:   105.00  cm/s LVOT Vmean:  66.900 cm/s LVOT VTI:    0.204 m  AORTA Ao Root diam: 3.80 cm Ao Asc diam:  4.00 cm  MITRAL VALVE               TRICUSPID VALVE MV Area (PHT): 4.39 cm    TR Peak grad:   6.4 mmHg MV Decel Time: 173 msec    TR Vmax:        126.00 cm/s MV E velocity: 68.30 cm/s SHUNTS Systemic VTI:  0.20 m Systemic Diam: 1.90 cm  Vishnu Priya Mallipeddi Electronically signed by Winfield Rast Mallipeddi Signature Date/Time: 12/30/2022/2:31:09 PM    Final    MONITORS  LONG TERM MONITOR (3-14 DAYS) 02/05/2023  Narrative Patch Wear Time:  13 days and 23 hours (2024-02-24T15:10:45-0500 to 2024-03-09T14:53:06-499)  Patient had a min HR of 57 bpm, max HR of 190 bpm, and avg HR of 84 bpm. Predominant underlying rhythm was Sinus Rhythm.  EVENTS:  11 Supraventricular Tachycardia runs occurred, the run with the fastest interval lasting 5 beats with a max rate of 190 bpm, the longest lasting 11 beats with an avg rate of 110 bpm.  Atrial Fibrillation occurred (4% burden), ranging from 83-185 bpm (avg of 129 bpm), the longest lasting 5 hours 42 mins with an avg rate of 130 bpm.  Isolated SVEs were rare (<1.0%), SVE Couplets were rare (<1.0%), and SVE Triplets were rare (<1.0%).  Isolated VEs were occasional (1.8%, [id number]), VE Couplets were rare (<1.0%,  112), and no VE Triplets were present. Ventricular Bigeminy and Trigeminy were present.  No sustained ventricular tachyarrhythmias or bradyarrhythmias were detected.  No patient triggered events.           Other studies Reviewed: Review of the additional studies/records demonstrates: Chest CT 2024 with coronary artery calcification and aortic atherosclerosis  Recent Labs: 12/31/2022: ALT 14; BUN 17; Creatinine, Ser 1.08; Potassium 3.9; Sodium 137 01/08/2023: Hemoglobin 11.8; Platelets 211 03/06/2023: TSH 4.987   Recent Lipid Panel No results found for: "CHOL", "TRIG", "HDL", "LDLCALC", "LDLDIRECT"  Risk Assessment/Calculations:                 Physical Exam:    VS:  BP 130/70   Pulse 90   Ht 5\' 8"  (1.727 m)   Wt 175 lb 3.2 oz (79.5 kg)   SpO2 98%   BMI 26.64 kg/m    Wt Readings from Last 3 Encounters:  04/02/23 175 lb 3.2 oz (79.5 kg)  03/06/23 173 lb 3.2 oz (78.6 kg)  03/06/23 172 lb 6.4 oz (78.2 kg)    GENERAL:  No apparent distress, AOx3 HEENT:  No carotid bruits, +2 carotid impulses, no scleral icterus CAR: RRR no murmurs, gallops, rubs, or thrills RES:  Clear to auscultation bilaterally ABD:  Soft, nontender, nondistended, positive bowel sounds x 4 VASC:  +2 radial pulses, +2 carotid pulses, palpable pedal pulses NEURO:  CN 2-12 grossly intact; motor and sensory grossly intact PSYCH:  No active depression or anxiety EXT:  No edema, ecchymosis, or cyanosis  Signed, Orbie Pyo, MD  04/02/2023 3:45 PM    [facility] [address] [place], [place], [place]  [address] Phone: [phone]; Fax: [phone]   Note:  This document was prepared using Dragon voice recognition software and may include unintentional dictation errors.

## 2023-04-02 ENCOUNTER — Encounter: Payer: Self-pay | Admitting: Internal Medicine

## 2023-04-02 ENCOUNTER — Ambulatory Visit: Payer: Medicare Other | Attending: Internal Medicine | Admitting: Internal Medicine

## 2023-04-02 VITALS — BP 130/70 | HR 90 | Ht 68.0 in | Wt 175.2 lb

## 2023-04-02 DIAGNOSIS — I10 Essential (primary) hypertension: Secondary | ICD-10-CM | POA: Insufficient documentation

## 2023-04-02 DIAGNOSIS — I319 Disease of pericardium, unspecified: Secondary | ICD-10-CM | POA: Insufficient documentation

## 2023-04-02 DIAGNOSIS — I480 Paroxysmal atrial fibrillation: Secondary | ICD-10-CM | POA: Diagnosis present

## 2023-04-02 DIAGNOSIS — Z720 Tobacco use: Secondary | ICD-10-CM | POA: Diagnosis present

## 2023-04-02 DIAGNOSIS — E785 Hyperlipidemia, unspecified: Secondary | ICD-10-CM | POA: Insufficient documentation

## 2023-04-02 DIAGNOSIS — I700 Atherosclerosis of aorta: Secondary | ICD-10-CM | POA: Insufficient documentation

## 2023-04-02 DIAGNOSIS — I2510 Atherosclerotic heart disease of native coronary artery without angina pectoris: Secondary | ICD-10-CM | POA: Diagnosis present

## 2023-04-02 NOTE — Patient Instructions (Signed)
Medication Instructions:  Your physician has recommended you make the following change in your medication:  1.) stop colchicine   *If you need a refill on your cardiac medications before your next appointment, please call your pharmacy*   Lab Work: Please return in about 8 weeks for blood work (lipids/liver function)  If you have labs (blood work) drawn today and your tests are completely normal, you will receive your results only by: MyChart Message (if you have MyChart) OR A paper copy in the mail If you have any lab test that is abnormal or we need to change your treatment, we will call you to review the results.   Testing/Procedures: none   Follow-Up: At [facility], you and your health needs are our priority.  As part of our continuing mission to provide you with exceptional heart care, we have created designated Provider Care Teams.  These Care Teams include your primary Cardiologist (physician) and Advanced Practice Providers (APPs -  Physician Assistants and Nurse Practitioners) who all work together to provide you with the care you need, when you need it.   Your next appointment:   6 month(s)  Provider:   Advanced Practice Practitioner (NP or PA-C)

## 2023-06-04 ENCOUNTER — Other Ambulatory Visit: Payer: Self-pay | Admitting: *Deleted

## 2023-06-04 ENCOUNTER — Ambulatory Visit: Payer: Medicare Other | Attending: Internal Medicine

## 2023-06-04 DIAGNOSIS — E785 Hyperlipidemia, unspecified: Secondary | ICD-10-CM

## 2023-06-05 LAB — LIPID PANEL
Chol/HDL Ratio: 2.3 ratio (ref 0.0–5.0)
Cholesterol, Total: 174 mg/dL (ref 100–199)
HDL: 75 mg/dL (ref >39)
LDL Chol Calc (NIH): 87 mg/dL (ref 0–99)
Triglycerides: 59 mg/dL (ref 0–149)
VLDL Cholesterol Cal: 12 mg/dL (ref 5–40)

## 2023-06-05 LAB — HEPATIC FUNCTION PANEL
ALT: 13 IU/L (ref 0–44)
AST: 15 IU/L (ref 0–40)
Albumin: 3.9 g/dL (ref 3.8–4.8)
Alkaline Phosphatase: 79 IU/L (ref 44–121)
Bilirubin Total: 0.4 mg/dL (ref 0.0–1.2)
Bilirubin, Direct: 0.15 mg/dL (ref 0.00–0.40)
Total Protein: 6.1 g/dL (ref 6.0–8.5)

## 2023-06-30 ENCOUNTER — Other Ambulatory Visit: Payer: Self-pay | Admitting: Internal Medicine

## 2023-07-02 NOTE — Telephone Encounter (Signed)
Prescription refill request for Eliquis received. Indication: PAF Last office visit: 04/02/23  Carmina Miller MD Scr: 1.08 on 12/31/22  Epic Age: 72 Weight: 79.5kg  Based on above findings Eliquis 5mg  twice daily is the appropriate dose.  Refill approved.

## 2023-07-25 ENCOUNTER — Other Ambulatory Visit: Payer: Self-pay | Admitting: Internal Medicine

## 2023-07-27 NOTE — Telephone Encounter (Signed)
Denied refill for pantoprazole.  This was ordered by hospital dc when patient was taking ibuprofen and colchicine for pericarditis.

## 2023-07-31 ENCOUNTER — Other Ambulatory Visit: Payer: Self-pay | Admitting: Internal Medicine

## 2023-10-04 NOTE — Progress Notes (Signed)
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 male with a PMH of CAD s/p STEMI 12/2022 LHC performed showing mild obstructive CAD, bradycardia and sinus pause, paroxysmal AF, HLD, squamous cell carcinoma of the head and neck s/p radical neck dissection and radiation 02/2022, COPD who presents today for 48-month follow-up.  Brad [name] was seen initially 12/29/2022 presented to [facility] complaint of chest pain.  He was completed showing ST elevation in lead 2 years ST elevation suspicious for pericarditis.  Third EKG does show dramatic change in inferior leads and code STEMI was activated and patient was transported to [facility] for heart cath.  LHC was completed revealing mild nonobstructive disease and patient was started on ibuprofen and colchicine for treatment of pericarditis.  Hospitalization patient had 2 to 3-second pauses with bradycardia thought to be possibly related to vasovagal with patient being asymptomatic.  Patient was then found to be in AF with RVR and was started on amiodarone drip with conversion to sinus rhythm.  He was discharged and started on amiodarone p.o. due to colchicine with initiation of Eliquis for stroke prophylaxis.  He wore a 2-week ZIO monitor at follow-up that showed 11 runs of SVT and 4% AF burden and patient was started on metoprolol 12.5 mg.  He was last  seen by Dr. [name] on 04/02/2023 and colchicine was discontinued after completing 55-month therapy.  He was resumed on Crestor 20 mg noted to be doing well exercising 3 times a week.  He was considering restarting Wellbutrin to help with smoking cessation.  Brad [name] reports today that his heart rate has been under control and denies any chest pain or shortness of breath. They have been exercising three times a week and have not restarted Wellbutrin for smoking cessation. They continue to smoke less than a pack a day, down from a pack plus. They plan to discuss the possibility of restarting Wellbutrin with their oncologist. They have not had any episodes of palpitations with activity or exercise.  He wears a smartwatch that monitors for AFib and report less than 2% incidence since hospital discharge.  He is on metoprolol half a tablet a day and report no side effects. They have not had any issues with swelling in their ankles. Patient denies chest pain, palpitations, dyspnea, PND, orthopnea, nausea, vomiting, dizziness, syncope, edema, weight gain, or early satiety.   Review of Systems  Please see the history of present illness.    All other systems reviewed and are otherwise negative except as noted above.  Physical Exam    Wt Readings from Last 3 Encounters:  04/02/23 175 lb 3.2 oz (79.5 kg)  03/06/23 173 lb 3.2 oz (78.6 kg)  03/06/23 172 lb 6.4 oz (78.2 kg)  ZO:[id number] were no vitals filed for this visit.,There is no height or weight on file to calculate BMI. GEN: Well nourished, well developed in no acute distress Neck: No JVD; No carotid bruits Pulmonary: Clear to auscultation without rales, wheezing or rhonchi  Cardiovascular: Normal rate. Regular rhythm. Normal S1. Normal S2.   Murmurs: There is no murmur.  ABDOMEN: Soft, non-tender, non-distended EXTREMITIES:  No edema; No deformity   EKG/LABS/ Recent Cardiac Studies   ECG personally reviewed by me today -none completed today  Risk  Assessment/Calculations:    CHA2DS2-VASc Score = 3   This indicates a 3.2% annual risk of stroke. The patient's score is based upon: CHF History: 0 HTN History: 1 Diabetes History: 0 Stroke History: 0 Vascular Disease History: 1 Age Score: 1 Gender Score: 0         Lab Results  Component Value Date   WBC 5.9 01/08/2023   HGB 11.8 (L) 01/08/2023   HCT 34.8 (L) 01/08/2023   MCV 95 01/08/2023   PLT 211 01/08/2023   Lab Results  Component Value Date   CREATININE 1.08 12/31/2022   BUN 17 12/31/2022   NA 137 12/31/2022   K 3.9 12/31/2022   CL 106 12/31/2022   CO2 23 12/31/2022   Lab Results  Component Value Date   CHOL 174 06/04/2023   HDL 75 06/04/2023   LDLCALC 87 06/04/2023   TRIG 59 06/04/2023   CHOLHDL 2.3 06/04/2023    No results found for: "HGBA1C" Assessment & Plan    1.  Paroxysmal AF: -New onset during admission for STEMI with conversion to sinus rhythm with amiodarone drip -Today patient is rate controlled with Toprol-XL 12.5 mg -Patient's last hemoglobin was 13.8 and creatinine was 1.0 -Continue Eliquis 5 mg twice daily -CHA2DS2-VASc Score = 3 [CHF History: 0, HTN History: 1, Diabetes History: 0, Stroke History: 0, Vascular Disease History: 1, Age Score: 1, Gender Score: 0].  Therefore, the patient's annual risk of stroke is 3.2 %.      2.  Nonobstructive CAD: -s/p LHC with no evidence of spasm, dissection or bridging.  Patient was treated for endocarditis and completed 58-month treatment of colchicine -Patient reports no chest pain or angina since previous follow-up.  3.  Essential hypertension: -Patient's blood pressure today was initially elevated at 140/64 and was 128/68 on recheck -Continue Toprol-XL 12.5 mg daily  4.  Hyperlipidemia: Currently on Crestor. Last cholesterol check in July was within normal limits. -Continue Crestor. Follow cholesterol levels as checked by primary care physician.  5.  Tobacco abuse: Smoking less than a pack per  day, down from a pack plus. Considering Wellbutrin for smoking cessation, to discuss with oncologist. -Encourage continued efforts to reduce and ultimately quit smoking.  Disposition: Follow-up with Brad Pyo, MD or APP in 6 months    Signed, Brad [name], Brad Rains, NP 10/04/2023, 8:47 AM [facility]

## 2023-10-05 ENCOUNTER — Ambulatory Visit: Payer: Medicare Other | Attending: Nurse Practitioner | Admitting: Nurse Practitioner

## 2023-10-05 ENCOUNTER — Encounter: Payer: Self-pay | Admitting: Nurse Practitioner

## 2023-10-05 VITALS — BP 128/68 | HR 82 | Ht 68.0 in | Wt 180.0 lb

## 2023-10-05 DIAGNOSIS — I319 Disease of pericardium, unspecified: Secondary | ICD-10-CM | POA: Diagnosis present

## 2023-10-05 DIAGNOSIS — E785 Hyperlipidemia, unspecified: Secondary | ICD-10-CM | POA: Diagnosis present

## 2023-10-05 DIAGNOSIS — I480 Paroxysmal atrial fibrillation: Secondary | ICD-10-CM | POA: Diagnosis present

## 2023-10-05 DIAGNOSIS — I10 Essential (primary) hypertension: Secondary | ICD-10-CM | POA: Diagnosis not present

## 2023-10-05 DIAGNOSIS — Z720 Tobacco use: Secondary | ICD-10-CM | POA: Diagnosis present

## 2023-10-05 DIAGNOSIS — I2510 Atherosclerotic heart disease of native coronary artery without angina pectoris: Secondary | ICD-10-CM | POA: Insufficient documentation

## 2023-10-05 NOTE — Patient Instructions (Signed)
Medication Instructions:  Your physician recommends that you continue on your current medications as directed. Please refer to the Current Medication list given to you today. *If you need a refill on your cardiac medications before your next appointment, please call your pharmacy*   Lab Work: None ordered   Testing/Procedures: None ordered   Follow-Up: At [facility], you and your health needs are our priority.  As part of our continuing mission to provide you with exceptional heart care, we have created designated Provider Care Teams.  These Care Teams include your primary Cardiologist (physician) and Advanced Practice Providers (APPs -  Physician Assistants and Nurse Practitioners) who all work together to provide you with the care you need, when you need it.  We recommend signing up for the patient portal called "MyChart".  Sign up information is provided on this After Visit Summary.  MyChart is used to connect with patients for Virtual Visits (Telemedicine).  Patients are able to view lab/test results, encounter notes, upcoming appointments, etc.  Non-urgent messages can be sent to your provider as well.   To learn more about what you can do with MyChart, go to [email or web address].    Your next appointment:   6 month(s)  Provider:   Orbie Pyo, MD     Other Instructions

## 2023-10-08 NOTE — Progress Notes (Signed)
Radiation Oncology         [phone] ________________________________  Name: Brad [name] MRN: [id number]  Date: 10/09/2023  DOB: 1951-10-04  Follow-Up Visit Note  CC: Darrow Bussing, MD  Darrow Bussing, MD  Diagnosis and Prior Radiotherapy:  C44.42   ICD-10-CM   1. Squamous cell carcinoma of head and neck  C44.42 Amb Referral to Survivorship Program    2. Secondary and unspecified malignant neoplasm of lymph nodes of head, face and neck (HCC)  C77.0     3. 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  Patient most recently met with Dr. Pollyann Kennedy on 09/27/2023. At that time, physical exam was unremarkable. He recommended a 3 month follow-up.   Brad [name]  is  here for a follow-up appointment today. Radiation Treatment Dates: 04/12/2022 through 05/25/2022  Pain issues, if any: No  Using a feeding tube?:  No Weight changes, if any:  States that he has gain 6 pounds in the last month Swallowing issues, if any: none Smoking or chewing tobacco? States that he smokes less than a pack of cigarettes daily. Using fluoride toothpaste daily? yes Last ENT visit was on: Last week Other notable issues, if any: Denies any other issues          ALLERGIES:  has No Known Allergies.  Meds: Current Outpatient  Medications  Medication Sig Dispense Refill   albuterol (PROVENTIL HFA;VENTOLIN HFA) 108 (90 BASE) MCG/ACT inhaler Inhale 2 puffs into the lungs every 6 (six) hours as needed for shortness of breath. shortness of breath     Budeson-Glycopyrrol-Formoterol (BREZTRI AEROSPHERE) 160-9-4.8 MCG/ACT AERO Inhale 2 puffs into the lungs in the morning and at bedtime.     buPROPion (WELLBUTRIN SR) 150 MG 12 hr tablet Start one week before quit date. Take 1 tab daily x 3 days, then 1 tab BID thereafter. 60 tablet 2   Cholecalciferol (VITAMIN D3 PO) Take 1 tablet by mouth in the morning.     Cyanocobalamin (VITAMIN B-12 PO) Take 1 tablet by mouth in the morning.     ELIQUIS 5 MG TABS tablet TAKE 1 TABLET BY MOUTH TWICE A DAY 60 tablet 5   metoprolol succinate (TOPROL XL) 25 MG 24 hr tablet Take 0.5 tablets (12.5 mg total) by mouth daily. 45 tablet 3   Multiple Vitamin (MULTIVITAMIN WITH MINERALS) TABS Take 1 tablet by mouth in the morning.     pantoprazole (PROTONIX) 40 MG tablet TAKE 1 TABLET BY MOUTH EVERY DAY 90 tablet 1   rosuvastatin (CRESTOR) 20 MG tablet Take 1 tablet by mouth daily. Per patient has to wait to start until finishes colchicine prescription     sildenafil (VIAGRA) 100 MG tablet Take 100 mg by mouth daily as needed for erectile dysfunction.     tamsulosin (FLOMAX) 0.4 MG CAPS  capsule Take 0.4 mg by mouth in the morning.     Testosterone 20.25 MG/ACT (1.62%) GEL Apply 1 Pump topically daily.     vitamin E 400 UNIT capsule Take 400 Units by mouth in the morning.     No current facility-administered medications for this encounter.    Physical Findings:  The patient is in no acute distress. Patient is alert and oriented. Wt Readings from Last 3 Encounters:  10/09/23 179 lb 6 oz (81.4 kg)  10/05/23 180 lb (81.6 kg)  04/02/23 175 lb 3.2 oz (79.5 kg)    height is 5\' 8"  (1.727 m) and weight is 179 lb 6 oz (81.4 kg). His temporal temperature is 97.1 F (36.2 C) (abnormal). His blood  pressure is 138/74 and his pulse is 74. His respiration is 18 and oxygen saturation is 97%. .  General: Alert and oriented, in no acute distress HEENT: Head is normocephalic. Extraocular movements are intact. Mucosa negative for tumor in upper throat/mouth Neck: Neck is notable for no palpable masses Cardiac: Heart regular in rate and rhythm; no murmurs, clicks, or gallops Pulm: CTA in all lung fields Skin: smooth, intact over neck; well healed from radiation Lymphatics: see Neck Exam Psychiatric: Judgment and insight are intact. Affect is appropriate.   Lab Findings: Lab Results  Component Value Date   WBC 5.9 01/08/2023   HGB 11.8 (L) 01/08/2023   HCT 34.8 (L) 01/08/2023   MCV 95 01/08/2023   PLT 211 01/08/2023    Lab Results  Component Value Date   TSH 4.987 (H) 03/06/2023    Radiographic Findings: No results found.  Impression/Plan:    1) Head and Neck Cancer Status: Patient is doing well overall. Most recent laryngoscope showed no evidence of disease recurrence.  2) Nutritional Status: Stable, patient has gained ~6 lbs Wt Readings from Last 3 Encounters:  10/09/23 179 lb 6 oz (81.4 kg)  10/05/23 180 lb (81.6 kg)  04/02/23 175 lb 3.2 oz (79.5 kg)   3) Risk Factors: The patient has been educated about risk factors including alcohol and tobacco abuse; they understand that avoidance of alcohol and tobacco is important to prevent recurrences as well as other cancers. Patient previously quit smoking with Wellbutrin and nicotine patches. He states this method helped him quit until he finished his prescription and then he resumed. Patient met with his cardiologist on 04/02/2023 who did not expect Wellbutrin to interact with Toprol. Prescription for Wellbutrin sent to his pharmacy today.   4) Swallowing: No issues   5) Dental: Encouraged to continue regular followup with dentistry, and dental hygiene including fluoride rinses.   6) Thyroid function: Checking TSH and free T4  later this week:  TSH is slightly elevated but Free T4 is within normal limits per our lab. I will reach out to his PCP to continue to check this routinely.    Lab Results  Component Value Date   TSH 4.987 (H) 03/06/2023    7) Patient will see Dr. Pollyann Kennedy in 3 months and follow-up with our survivorship clinic in 6 months. We appreciate the opportunity to participate in this patient's care.   On date of service, in total, I spent 30 minutes on this encounter. Patient was seen in person.    _____________________________________   Joyice Faster, PA

## 2023-10-09 ENCOUNTER — Encounter: Payer: Self-pay | Admitting: Radiation Oncology

## 2023-10-09 ENCOUNTER — Ambulatory Visit
Admission: RE | Admit: 2023-10-09 | Discharge: 2023-10-09 | Disposition: A | Discharge status: Home / Self Care | Payer: Medicare Other | Source: Ambulatory Visit | Attending: Radiation Oncology | Admitting: Radiation Oncology

## 2023-10-09 ENCOUNTER — Other Ambulatory Visit: Payer: Self-pay

## 2023-10-09 VITALS — BP 138/74 | HR 74 | Temp 97.1°F | Resp 18 | Ht 68.0 in | Wt 179.4 lb

## 2023-10-09 DIAGNOSIS — Z7901 Long term (current) use of anticoagulants: Secondary | ICD-10-CM | POA: Insufficient documentation

## 2023-10-09 DIAGNOSIS — C4442 Squamous cell carcinoma of skin of scalp and neck: Secondary | ICD-10-CM | POA: Insufficient documentation

## 2023-10-09 DIAGNOSIS — C770 Secondary and unspecified malignant neoplasm of lymph nodes of head, face and neck: Secondary | ICD-10-CM | POA: Insufficient documentation

## 2023-10-09 DIAGNOSIS — Z79890 Hormone replacement therapy (postmenopausal): Secondary | ICD-10-CM | POA: Diagnosis not present

## 2023-10-09 DIAGNOSIS — F17200 Nicotine dependence, unspecified, uncomplicated: Secondary | ICD-10-CM

## 2023-10-09 DIAGNOSIS — Z79899 Other long term (current) drug therapy: Secondary | ICD-10-CM | POA: Diagnosis not present

## 2023-10-09 DIAGNOSIS — R5381 Other malaise: Secondary | ICD-10-CM

## 2023-10-09 MED ORDER — BUPROPION HCL ER (SR) 150 MG PO TB12: ORAL_TABLET | ORAL | 2 refills | Status: AC

## 2023-10-09 NOTE — Progress Notes (Signed)
  Brad [name]  is  here for a follow-up appointment today. Radiation Treatment Dates: 04/12/2022 through 05/25/2022  Pain issues, if any: No  Using a feeding tube?:  No Weight changes, if any:  States that he has gain 6 pounds in the last month Swallowing issues, if any: none Smoking or chewing tobacco? States that he smokes less than a pack of cigarettes daily. Using fluoride toothpaste daily? yes Last ENT visit was on: Last week Other notable issues, if any: Denies any other issues Vitals:   10/09/23 1345  BP: 138/74  Pulse: 74  Resp: 18  Temp: (!) 97.1 F (36.2 C)  TempSrc: Temporal  SpO2: 97%  Weight: 81.4 kg  Height: 5\' 8"  (1.727 m)

## 2023-10-09 NOTE — Progress Notes (Signed)
Oncology Nurse Navigator Documentation   I met with Brad [name] today during his scheduled radiation follow up. He is doing well. He will be referred to survivorship here at the [facility] and will continue to follow with Dr. Pollyann Kennedy. He will come back later this week to have a TSH lab drawn. He knows that we will call him with the results.   Hedda Slade RN, BSN, OCN Head & Neck Oncology Nurse Navigator [facility] Phone # [phone]  Fax # [phone]

## 2023-10-11 ENCOUNTER — Ambulatory Visit
Admission: RE | Admit: 2023-10-11 | Discharge: 2023-10-11 | Disposition: A | Discharge status: Home / Self Care | Payer: Medicare Other | Source: Ambulatory Visit | Attending: Radiation Oncology | Admitting: Radiation Oncology

## 2023-10-11 DIAGNOSIS — C801 Malignant (primary) neoplasm, unspecified: Secondary | ICD-10-CM | POA: Diagnosis not present

## 2023-10-11 DIAGNOSIS — C770 Secondary and unspecified malignant neoplasm of lymph nodes of head, face and neck: Secondary | ICD-10-CM | POA: Insufficient documentation

## 2023-10-11 DIAGNOSIS — Z1389 Encounter for screening for other disorder: Secondary | ICD-10-CM | POA: Diagnosis not present

## 2023-10-11 DIAGNOSIS — R946 Abnormal results of thyroid function studies: Secondary | ICD-10-CM | POA: Insufficient documentation

## 2023-10-11 DIAGNOSIS — R5381 Other malaise: Secondary | ICD-10-CM

## 2023-10-11 LAB — TSH: TSH: 6.673 u[IU]/mL — ABNORMAL HIGH (ref 0.350–4.500)

## 2023-10-12 ENCOUNTER — Other Ambulatory Visit: Payer: Self-pay

## 2023-10-12 DIAGNOSIS — R5381 Other malaise: Secondary | ICD-10-CM

## 2023-10-12 LAB — T4, FREE: Free T4: 0.7 ng/dL (ref 0.61–1.12)

## 2023-12-28 ENCOUNTER — Other Ambulatory Visit: Payer: Self-pay | Admitting: Internal Medicine

## 2023-12-28 DIAGNOSIS — I480 Paroxysmal atrial fibrillation: Secondary | ICD-10-CM

## 2023-12-28 NOTE — Telephone Encounter (Signed)
 Prescription refill request for Eliquis  received. Indication: Afib  Last office visit: 10/05/23 Brad [name])  Scr: 1.08 (12/31/22)  Age: 73 Weight: 81.4kg  Appropriate dose. Refill sent.

## 2024-02-26 ENCOUNTER — Other Ambulatory Visit: Payer: Self-pay | Admitting: Student

## 2024-03-24 ENCOUNTER — Encounter: Payer: Self-pay | Admitting: Nurse Practitioner

## 2024-03-24 ENCOUNTER — Inpatient Hospital Stay: Payer: Medicare Other | Attending: Nurse Practitioner | Admitting: Nurse Practitioner

## 2024-03-24 VITALS — BP 158/73 | HR 63 | Temp 97.7°F | Resp 13 | Wt 184.6 lb

## 2024-03-24 DIAGNOSIS — Z85818 Personal history of malignant neoplasm of other sites of lip, oral cavity, and pharynx: Secondary | ICD-10-CM | POA: Diagnosis present

## 2024-03-24 DIAGNOSIS — C770 Secondary and unspecified malignant neoplasm of lymph nodes of head, face and neck: Secondary | ICD-10-CM | POA: Diagnosis not present

## 2024-03-24 NOTE — Progress Notes (Signed)
 CLINIC:  Survivorship  REASON FOR VISIT:  Routine follow-up for history of head & neck cancer.  BRIEF ONCOLOGIC HISTORY:  Oncology History  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)  03/03/2022 Initial Diagnosis   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)   03/03/2022 Initial Biopsy   FINAL MICROSCOPIC DIAGNOSIS:  A. VALLACULA, LEFT, BIOPSY: - Benign squamous mucosa and tonsillar tissue - Negative for carcinoma  B. TONGUE, LEFT BASE, BIOPSY: - Benign squamous mucosa and underlying fibroconnective tissue - Negative for carcinoma  C. TONSIL, LEFT, BIOPSY: - Benign squamous mucosa and underlying fibroconnective tissue - Negative for carcinoma  D. NASOPHARYNX, LEFT, BIOPSY: - Benign sinonasal mucosa - Negative for carcinoma  E. NECK, LEFT INFERIOR JUGULAR VEIN, MODIFIED RADICAL DISSECTION: - Metastatic squamous cell carcinoma involving three of twenty-three lymph nodes (3/23) - 2 lymph nodes from level II and 1 lymph node from level III are involved by carcinoma - No evidence of extranodal extension - Portion of jugular vein with thrombus, negative for carcinoma - Benign salivary gland, negative for carcinoma    03/31/2022 Cancer Staging   Staging form: Pharynx - HPV-Mediated Oropharynx, AJCC 8th Edition - Pathologic stage from 03/31/2022: Stage I (pT0, pN1, cM0, p16+) - Signed by Brad Dawes, MD on 03/31/2022 Stage prefix: Initial diagnosis   04/12/2022 - 05/25/2022 Radiation Therapy   Radiation Treatment Dates: 04/12/2022 through 05/25/2022 Site Technique Total Dose (Gy) Dose per Fx (Gy) Completed Fx Beam Energies  Neck: HN_orophar IMRT 60/60 2 30/30 6X   by Dr. Lurena [name]   03/02/2023 Imaging   CT chest/neck IMPRESSION: 1. No evidence of lymphadenopathy or metastatic disease in the chest. 2. Coronary artery disease.        INTERVAL HISTORY:  Brad [name] presents for survivorship visit as scheduled.  Doing well in the interim.  Eating and drinking well with  no specific issues or complaints today.  -Pain: Denies -Nutrition/Diet: Normal/regular, po -Dysphagia?:  Dry mouth but no dysphagia -Dental issues?:  As needed using fluoride trays?  Yes -Last TSH: 09/2023 in our system, more recent with PCP at [facility] and has started levothyroxine -Weight: gain  -Last ENT visit:  09/25/23 with Brad [name] (overdue 4 mo f/up) -Last Rad Onc visit: 10/09/23 with Brad Ohm, PA    ADDITIONAL REVIEW OF SYSTEMS:  ROS    CURRENT MEDICATIONS:  Current Outpatient Medications on File Prior to Visit  Medication Sig Dispense Refill   albuterol  (PROVENTIL  HFA;VENTOLIN  HFA) 108 (90 BASE) MCG/ACT inhaler Inhale 2 puffs into the lungs every 6 (six) hours as needed for shortness of breath. shortness of breath     Budeson-Glycopyrrol-Formoterol  (BREZTRI  AEROSPHERE) 160-9-4.8 MCG/ACT AERO Inhale 2 puffs into the lungs in the morning and at bedtime.     buPROPion  (WELLBUTRIN  SR) 150 MG 12 hr tablet Start one week before quit date. Take 1 tab daily x 3 days, then 1 tab BID thereafter. 60 tablet 2   Cholecalciferol  (VITAMIN D3 PO) Take 1 tablet by mouth in the morning.     Cyanocobalamin  (VITAMIN B-12 PO) Take 1 tablet by mouth in the morning.     ELIQUIS  5 MG TABS tablet TAKE 1 TABLET BY MOUTH TWICE A DAY 60 tablet 5   metoprolol  succinate (TOPROL -XL) 25 MG 24 hr tablet TAKE 1/2 TABLET BY MOUTH EVERY DAY 45 tablet 0   Multiple Vitamin (MULTIVITAMIN WITH MINERALS) TABS Take 1 tablet by mouth in the morning.     pantoprazole  (PROTONIX ) 40 MG tablet TAKE 1 TABLET BY  MOUTH EVERY DAY (Patient not taking: Reported on 03/24/2024) 90 tablet 1   rosuvastatin  (CRESTOR ) 20 MG tablet Take 1 tablet by mouth daily. Per patient has to wait to start until finishes colchicine  prescription     sildenafil  (VIAGRA ) 100 MG tablet Take 100 mg by mouth daily as needed for erectile dysfunction.     tamsulosin  (FLOMAX ) 0.4 MG CAPS capsule Take 0.4 mg by mouth in the morning.     Testosterone  20.25  MG/ACT (1.62%) GEL Apply 1 Pump topically daily.     vitamin E  400 UNIT capsule Take 400 Units by mouth in the morning.     No current facility-administered medications on file prior to visit.    ALLERGIES:  No Known Allergies   PHYSICAL EXAM:  Vitals:   03/24/24 0852  BP: (!) 158/73  Pulse: 63  Resp: 13  Temp: 97.7 F (36.5 C)  SpO2: 99%   Filed Weights   03/24/24 0852  Weight: 184 lb 9.6 oz (83.7 kg)   General: Well-nourished, well-appearing male in no acute distress.   HEENT: Head is atraumatic and normocephalic. Sclerae anicteric. Oral mucosa is pink and moist without lesions.  Tongue pink, moist, and midline. Oropharynx is pink and moist, without lesions. Lymph: No preauricular, postauricular, cervical, supraclavicular, or infraclavicular lymphadenopathy noted on palpation.   Neck: No palpable masses.  Cardiovascular: Normal rate and rhythm. Respiratory: Clear; breathing non-labored.  GI: Abdomen soft and round. Non-tender, non-distended. Bowel sounds normoactive.  Neuro: No focal deficits. Steady gait.   Psych: Normal mood and affect for situation. Extremities: No edema.  Skin: Warm and dry.    LABORATORY DATA:  None at this visit.  DIAGNOSTIC IMAGING:  None at this visit.    ASSESSMENT & PLAN:  Mr. Brad [name] is a pleasant 73 y.o. male with history of head and neck cancer, diagnosed in 02/2022;  treated with neck dissection and adjuvant radiation; completed treatment on 11/25/2021.  Patient presents to survivorship clinic today for long-term follow-up  Cancer:  Mr. Brad [name] is clinically doing well, no evidence of disease or recurrence on physical exam today.     Nutritional status: Mr. Brad [name] reports that he is currently able to consume adequate nutrition by mouth.  Weight is stable/up.  Encouraged to continue to consume adequate hydration and nutrition, as tolerated.    At risk for dysphagia: Denies dysphagia, no indication for SLP at this time.  At risk for neck  lymphedema: No evidence of LE, no indication for PT at this time.      At risk for hypothyroidism: Managed by PCP, on levothyroxine  At risk for tooth decay/dental concerns: Currently seeing dentist as needed, I encouraged pt to go 3-4 times per year for routine upkeep/cleanings and regular fluoride use.  Explained rationale, as patient has dry mouth.  Tobacco & alcohol use: Mr. Heady continues to smoke cigarettes but has the prescription for Wellbutrin  and just needs to "get serious" and quit.  Feels he does not need other help to quit.  Drinks 1 martini at 6 PM daily with the news, no other alcohol use.  I reinforced the importance of avoiding tobacco and alcohol consumption, as use in patients with head & neck cancer increases the risk of recurrence.  They also increase the risk of other cancers, as well.  Mr. Tiet states that he voiced understanding.  Health maintenance and wellness promotion: He is active, walks, and does weights.  Encouraged to continue healthy active lifestyle. Give his medical history and radiation  to bilateral neck nodes, he would likely be a good candidate for carotid US  starting 5 years post radiation.  Support services/counseling: Appears well-adjusted, no current need for support/counseling.  The patient was offered support today through active listening and expressive support.   Dispo:  -See Brad [name] ENT 04/01/24  -Return to Rad onc PRN, PCP as scheduled -Return to cancer center to see Survivorship NP in 1 year, or sooner if needed      A total of 30 minutes was spent in today's encounter, with greater than 50% of that time spent in counseling and care-coordination.    Pranathi Winfree, NP Survivorship Program [facility] [phone]

## 2024-04-01 NOTE — Progress Notes (Signed)
 Return visit.   Cancer site: Unknown primary Stage: T0N1 HPV positive Treatment type: Neck dissection, XRT Date of treatment: Completed July 2023 Smoking status:    Otolaryngology Office Note  HPI:    Brad [name] is a 73 y.o. male who presents as a return Patient.    Current problem: Head neck cancer.  HPI: Almost 2 years posttreatment, doing great, no complaints.  Gaining a little bit of weight back.  PMH/Meds/All/SocHx/FamHx/ROS:   History reviewed. No pertinent past medical history.  History reviewed. No pertinent surgical history.  No family history of bleeding disorders, wound healing problems or difficulty with anesthesia.       Current Outpatient Medications:  .  albuterol  HFA (PROVENTIL  HFA;VENTOLIN  HFA;PROAIR  HFA) 90 mcg/actuation inhaler, 2 PUFFS AS NEEDED EVERY 6 HOURLY AS NEEDED FOR WHEEZING INHALATION 30 DAYS 25, Disp: , Rfl:  .  alpha tocopherol (VITAMIN E ) 268 mg (400 unit) capsule, Take 400 Units by mouth Once Daily., Disp: , Rfl:  .  aspirin  81 mg chewable tablet, Take 81 mg by mouth., Disp: , Rfl:  .  budesonide -glycopyr-formoterol  (Breztri  Aerosphere) 160-9-4.8 mcg/actuation HFAA, INHALE 2 PUFFS INTO THE LUNGS TWICE A DAY FOR 30 DAYS, Disp: , Rfl:  .  cyanocobalamin , vitamin B-12, 1,000 mcg ER tablet, 1 tablet Orally Once a day, Disp: , Rfl:  .  Eliquis  5 mg tab, Take 5 mg by mouth 2 (two) times a day., Disp: , Rfl:  .  ergocalciferol  (VITAMIN D2) 1,250 mcg (50,000 unit) capsule, 1 capsule., Disp: , Rfl:  .  lidocaine  (XYLOCAINE ) 2 % viscous solution, Patient: Mix 1part 2% viscous lidocaine , 1part H20. Swish & swallow 10mL of diluted mixture, before meals and at bedtime, up to QID, Disp: , Rfl:  .  losartan -hydroCHLOROthiazide  (HYZAAR) 50-12.5 mg per tablet, Take 1 tablet by mouth Once Daily., Disp: , Rfl:  .  metoprolol  succinate (TOPROL  XL) 25 mg 24 hr tablet, Take 12.5 mg by mouth daily., Disp: , Rfl:  .  multivitamin cap, 1 tablet, Disp: , Rfl:   .  nicotine  (NICODERM CQ ) 14 mg/24 hr patch, Place 14 mg on the skin., Disp: , Rfl:  .  ondansetron  (ZOFRAN -ODT) 8 mg disintegrating tablet, Take 8 mg by mouth., Disp: , Rfl:  .  pantoprazole  (PROTONIX ) 40 mg EC tablet, Take 1 tablet by mouth daily., Disp: , Rfl:  .  rosuvastatin  (CRESTOR ) 20 mg tablet, Take 1 tablet by mouth Once Daily., Disp: , Rfl:  .  sildenafiL  (VIAGRA ) 100 mg tablet, 1/2 TABLET TO 1 TABLET AS NEEDED ORALLY 30 DAYS, Disp: , Rfl:  .  tamsulosin  (FLOMAX ) 0.4 mg cap, Take 1 capsule by mouth Once Daily., Disp: , Rfl:  .  testosterone  20.25 mg/1.25 gram (1.62 %) glpm, , Disp: , Rfl:       Physical Exam:      Healthy appearing in no distress.  Breathing and voice are clear.  Ear canals drums and middle ears look healthy.  Nasal exam is unremarkable.  Oral cavity and pharynx are healthy and clear without any mucosal masses.  Indirect exam of the larynx and hypopharynx is clear and symmetric without any abnormalities.  No palpable neck masses.  Diffuse fibrosis of the neck.   Independent Review of Additional Tests or Records:  none  Procedures:  none   Impression & Plans:  Stable, no evidence of recurrence.  Recheck 4 months or sooner if needed.  Medical Decision Making: #/Compl Problems  2 Data Rev  1  Management  2  (1-Straightforward, 2-Low, 3- Moderate, 4-High)   Ida VEAR Loader, MD

## 2024-05-18 ENCOUNTER — Other Ambulatory Visit: Payer: Self-pay | Admitting: Nurse Practitioner

## 2024-06-17 NOTE — Progress Notes (Unsigned)
 Cardiology Office Note:   Date:  06/18/2024  ID:  Brad Brad [name], DOB [birth year], MRN [id number] PCP:  Brad Slater, MD  [facility] Providers Cardiologist:  Brad Haws, MD Referring MD: Brad Slater, MD  Chief Complaint/Reason for Referral: Follow-up for PAF ASSESSMENT:    1. Mild CAD   2. Pericarditis, unspecified chronicity, unspecified type   3. PAF (paroxysmal atrial fibrillation) (HCC)   4. Secondary hypercoagulable state (HCC)   5. Hyperlipidemia LDL goal <70   6. Aortic atherosclerosis (HCC)   7. Medication management     PLAN:   In order of problems listed above: Mild CAD: Continue Eliquis  5 mg twice daily, rosuvastatin  20 mg. Pericarditis: Completed high-dose NSAID therapy and 3 months of colchicine .  Check CRP to document normalization. PAF: Continue Eliquis  5 mg twice daily and metoprolol  12.5 mg daily Secondary hypercoagulable state: Continue Eliquis  5 mg twice daily Hyperlipidemia: LDL ws 86 at PCP office recently; increase Crestor  40 mg.  Check lipids/LFTs in 2 months. Aortic atherosclerosis: Continue Eliquis  5 mg twice daily and rosuvastatin  20 mg daily            Dispo:  Return in about 9 months (around 03/19/2025).       I spent 35 minutes reviewing all clinical data during and prior to this visit including all relevant imaging studies, laboratories, clinical information from other health systems and prior notes from both Cardiology and other specialties, interviewing Brad Brad [name], conducting a complete physical examination, and coordinating care in order to formulate a comprehensive and personalized evaluation and treatment plan.   History of Present Illness:    FOCUSED PROBLEM LIST:   Pericarditis February 2024 2024 Coronary angiography >> mild disease Treated with high-dose NSAIDs and 3 months of colchicine  PAF On Eliquis  CAD Mild nonobstructive disease, cath February 2024 Hyperlipidemia LP(a) 45 Aortic atherosclerosis Chest CT  2024 Squamous cell carcinoma Status post XRT and radical neck dissection 2023 COPD Ongoing tobacco abuse  May 2024: Brad Brad [name] is a 73 y.o. male with Brad indicated medical history here for routine cardiology follow-up.  Following discharge from Brad hospital in February due to acute pericarditis he was seen a few weeks later.  He was without chest pain or shortness of breath.  He was continuing to smoke cigarettes at that time.  He was counseled on NSAID taper with a plan to continue colchicine  for total of 3 months.  Monitor and labs were also obtained; monitor demonstrated paroxysmal atrial fibrillation and Eliquis  was prescribed.   Brad Brad [name] is doing very well.  He exercises on a routine basis 3 times a week without any limitations.  He is tolerating his anticoagulation well.  He is finished his colchicine  course.  He has had no recurrence of pericarditis.  He has not required any emergency room visits or hospitalizations.  Of note he is smoking a bit.  He did speak to his radiation oncologist Dr. Izell since he was on Wellbutrin  before and they are thinking about restarting this.  There was a concern about its drug drug interaction with Toprol .  Plan: Stop colchicine , resume Crestor  20 mg and check lipid panel LFTs in 2 months.  July 2025:  Brad [name] consents to use of AI scribe. In Brad interim his LDL was above 70.  His Crestor  20 mg was resumed.  He was last seen in cardiology clinic in November 2024.  He was doing well and was without cardiovascular complaints.  His blood pressure was controlled at that visit.  He  feels well with no chest pain, shortness of breath, or palpitations. There has been no discomfort since his previous episode of pericarditis. No leg swelling or orthopnea.  He is currently taking Eliquis  twice a day and reports minor bruising and bleeding, which he attributes to his thin skin. He also takes a cholesterol medication, which was recently restarted, and his LDL was  noted to be 86 mg/dL at his primary care physician's office.  He maintains an active lifestyle, going to Brad gym three to four days a week for an hour to an hour and a half, where he spends 30 minutes on Brad treadmill and engages in strength training using weight machines. He lives in [name] and frequents [name], which is convenient for him and allows him to work out with his grandson.  He uses a bed that inclines to prevent snoring, as noted by his wife, but has not been tested for sleep apnea. He wears a watch that records his sleep, indicating he wakes up briefly once or twice a night, not including bathroom trips. He denies daytime sleepiness and rarely takes naps    Current Medications: Current Meds  Medication Sig   albuterol  (PROVENTIL  HFA;VENTOLIN  HFA) 108 (90 BASE) MCG/ACT inhaler Inhale 2 puffs into Brad lungs every 6 (six) hours as needed for shortness of breath. shortness of breath   Budeson-Glycopyrrol-Formoterol  (BREZTRI  AEROSPHERE) 160-9-4.8 MCG/ACT AERO Inhale 2 puffs into Brad lungs in Brad morning and at bedtime.   buPROPion  (WELLBUTRIN  SR) 150 MG 12 hr tablet Start one week before quit date. Take 1 tab daily x 3 days, then 1 tab BID thereafter.   Cholecalciferol  (VITAMIN D3 PO) Take 1 tablet by mouth in Brad morning.   Cyanocobalamin  (VITAMIN B-12 PO) Take 1 tablet by mouth in Brad morning.   metoprolol  succinate (TOPROL -XL) 25 MG 24 hr tablet TAKE 1/2 TABLET BY MOUTH DAILY   Multiple Vitamin (MULTIVITAMIN WITH MINERALS) TABS Take 1 tablet by mouth in Brad morning.   sildenafil  (VIAGRA ) 100 MG tablet Take 100 mg by mouth daily as needed for erectile dysfunction.   tamsulosin  (FLOMAX ) 0.4 MG CAPS capsule Take 0.4 mg by mouth in Brad morning.   Testosterone  20.25 MG/ACT (1.62%) GEL Apply 1 Pump topically daily.   vitamin E  400 UNIT capsule Take 400 Units by mouth in Brad morning.   [DISCONTINUED] ELIQUIS  5 MG TABS tablet TAKE 1 TABLET BY MOUTH TWICE A DAY   [DISCONTINUED]  rosuvastatin  (CRESTOR ) 20 MG tablet Take 1 tablet by mouth daily. Per Brad [name] has to wait to start until finishes colchicine  prescription     Review of Systems:   Please see Brad history of present illness.    All other systems reviewed and are negative.     EKGs/Labs/Other Test Reviewed:   EKG:   EKG Interpretation Date/Time:  Wednesday June 18 2024 10:23:42 EDT Ventricular Rate:  85 PR Interval:  162 QRS Duration:  88 QT Interval:  350 QTC Calculation: 416 R Axis:   6  Text Interpretation: Normal sinus rhythm Cannot rule out Anterior infarct , age undetermined When compared with ECG of 30-Dec-2022 14:28, ST no longer elevated in Inferior leads ST no longer elevated in Lateral leads Confirmed by Brad Brad [name] ([id number]) on 06/18/2024 10:53:34 AM         Risk Assessment/Calculations:          Physical Exam:   VS:  BP 116/64   Pulse 85   Ht 5' 8 (1.727 m)   Wt 184  lb (83.5 kg)   SpO2 96%   BMI 27.98 kg/m        Wt Readings from Last 3 Encounters:  06/18/24 184 lb (83.5 kg)  03/24/24 184 lb 9.6 oz (83.7 kg)  10/09/23 179 lb 6 oz (81.4 kg)      GENERAL:  No apparent distress, AOx3 HEENT:  No carotid bruits, +2 carotid impulses, no scleral icterus CAR: RRR no murmurs, gallops, rubs, or thrills RES:  Clear to auscultation bilaterally ABD:  Soft, nontender, nondistended, positive bowel sounds x 4 VASC:  +2 radial pulses, +2 carotid pulses NEURO:  CN 2-12 grossly intact; motor and sensory grossly intact PSYCH:  No active depression or anxiety EXT:  No edema, ecchymosis, or cyanosis  Signed, Joyanne Eddinger K Krishauna Schatzman, MD  06/18/2024 11:33 AM    [facility] [address] [place], [place], [place]  [address] Phone: [phone]; Fax: [phone]   Note:  This document was prepared using Dragon voice recognition software and may include unintentional dictation errors.

## 2024-06-18 ENCOUNTER — Ambulatory Visit: Attending: Internal Medicine | Admitting: Internal Medicine

## 2024-06-18 ENCOUNTER — Encounter: Payer: Self-pay | Admitting: Internal Medicine

## 2024-06-18 VITALS — BP 116/64 | HR 85 | Ht 68.0 in | Wt 184.0 lb

## 2024-06-18 DIAGNOSIS — E785 Hyperlipidemia, unspecified: Secondary | ICD-10-CM | POA: Diagnosis present

## 2024-06-18 DIAGNOSIS — I319 Disease of pericardium, unspecified: Secondary | ICD-10-CM | POA: Insufficient documentation

## 2024-06-18 DIAGNOSIS — D6869 Other thrombophilia: Secondary | ICD-10-CM | POA: Diagnosis present

## 2024-06-18 DIAGNOSIS — I2510 Atherosclerotic heart disease of native coronary artery without angina pectoris: Secondary | ICD-10-CM | POA: Insufficient documentation

## 2024-06-18 DIAGNOSIS — I480 Paroxysmal atrial fibrillation: Secondary | ICD-10-CM | POA: Insufficient documentation

## 2024-06-18 DIAGNOSIS — Z79899 Other long term (current) drug therapy: Secondary | ICD-10-CM | POA: Insufficient documentation

## 2024-06-18 DIAGNOSIS — I700 Atherosclerosis of aorta: Secondary | ICD-10-CM | POA: Diagnosis present

## 2024-06-18 MED ORDER — APIXABAN 5 MG PO TABS: 5.0000 mg | ORAL_TABLET | Freq: Two times a day (BID) | ORAL | 11 refills | Status: AC

## 2024-06-18 MED ORDER — ROSUVASTATIN CALCIUM 40 MG PO TABS: 40.0000 mg | ORAL_TABLET | Freq: Every day | ORAL | 3 refills | Status: AC

## 2024-06-18 NOTE — Patient Instructions (Signed)
 Medication Instructions:  Increase Rosuvastatin   (Crestor )  to 40 mg daily *If you need a refill on your cardiac medications before your next appointment, please call your pharmacy*  Lab Work: Lipid panel, Liver function test, CRP- In 2 months. No eating or drinking after midnight- water is ok. You can have this drawn at any [employer] [employer]- or come to our office at [address] [place], [place] [address], lab is on the 1st floor. No appointment needed- drop in from 8-4. The lab order is in the computer, a lab slip is not needed. Thank you Katie, RN  If you have labs (blood work) drawn today and your tests are completely normal, you will receive your results only by: MyChart Message (if you have MyChart) OR A paper copy in the mail If you have any lab test that is abnormal or we need to change your treatment, we will call you to review the results.  Testing/Procedures: None ordered  Follow-Up: At [facility], you and your health needs are our priority.  As part of our continuing mission to provide you with exceptional heart care, our providers are all part of one team.  This team includes your primary Cardiologist (physician) and Advanced Practice Providers or APPs (Physician Assistants and Nurse Practitioners) who all work together to provide you with the care you need, when you need it.  Your next appointment:   9 month(s)  Provider:   Arun K Thukkani, MD    We recommend signing up for the patient portal called MyChart.  Sign up information is provided on this After Visit Summary.  MyChart is used to connect with patients for Virtual Visits (Telemedicine).  Patients are able to view lab/test results, encounter notes, upcoming appointments, etc.  Non-urgent messages can be sent to your provider as well.   To learn more about what you can do with MyChart, go to [email or web address].

## 2024-07-21 ENCOUNTER — Encounter: Payer: Self-pay | Admitting: Internal Medicine

## 2024-07-31 NOTE — Progress Notes (Signed)
 Return visit.   Cancer site: Unknown primary Stage: T0N1 HPV positive Treatment type: Neck dissection, XRT Date of treatment: Completed July 2023 Smoking status:    Otolaryngology Office Note  HPI:    Brad [name] is a 73 y.o. male who presents as a return Patient.    Current problem: Head and neck cancer.  HPI: 2+ years posttreatment, doing great.  No complaints.  PMH/Meds/All/SocHx/FamHx/ROS:   Medical History[1]  Surgical History[2]  No family history of bleeding disorders, wound healing problems or difficulty with anesthesia.      Current Medications[3]      Physical Exam:      Healthy-appearing in no distress.  Breathing and voice are clear.  Nasal exam is unremarkable.  Oral cavity and pharynx are healthy and clear.  Indirect exam of the tongue base hypopharynx and larynx is all normal and symmetric.  Palpation of the neck reveals no masses or adenopathy.  There is diffuse fibrosis that is moderate in severity.   Independent Review of Additional Tests or Records:  none  Procedures:  none   Impression & Plans:  Stable, no evidence of recurrent disease.  Recheck 6 months or sooner as needed.  Medical Decision Making: #/Compl Problems  2 Data Rev  1  Management 2  (1-Straightforward, 2-Low, 3- Moderate, 4-High)   Ida VEAR Loader, MD        [1] Past Medical History: Diagnosis Date  . Pollen allergies   [2] No past surgical history on file. [3]  Current Outpatient Medications:  .  albuterol  HFA (PROVENTIL  HFA;VENTOLIN  HFA;PROAIR  HFA) 90 mcg/actuation inhaler, 2 PUFFS AS NEEDED EVERY 6 HOURLY AS NEEDED FOR WHEEZING INHALATION 30 DAYS 25, Disp: , Rfl:  .  alpha tocopherol (VITAMIN E ) 268 mg (400 unit) capsule, Take 400 Units by mouth Once Daily., Disp: , Rfl:  .  aspirin  81 mg chewable tablet, Take 81 mg by mouth., Disp: , Rfl:  .  budesonide -glycopyr-formoterol  (Breztri  Aerosphere) 160-9-4.8 mcg/actuation HFAA, INHALE 2 PUFFS INTO THE LUNGS  TWICE A DAY FOR 30 DAYS, Disp: , Rfl:  .  cyanocobalamin , vitamin B-12, 1,000 mcg ER tablet, 1 tablet Orally Once a day, Disp: , Rfl:  .  Eliquis  5 mg tab, Take 5 mg by mouth 2 (two) times a day., Disp: , Rfl:  .  ergocalciferol  (VITAMIN D2) 1,250 mcg (50,000 unit) capsule, 1 capsule., Disp: , Rfl:  .  lidocaine  (XYLOCAINE ) 2 % viscous solution, Patient: Mix 1part 2% viscous lidocaine , 1part H20. Swish & swallow 10mL of diluted mixture, before meals and at bedtime, up to QID, Disp: , Rfl:  .  losartan -hydroCHLOROthiazide  (HYZAAR) 50-12.5 mg per tablet, Take 1 tablet by mouth Once Daily., Disp: , Rfl:  .  metoprolol  succinate (TOPROL  XL) 25 mg 24 hr tablet, Take 12.5 mg by mouth daily., Disp: , Rfl:  .  multivitamin cap, 1 tablet, Disp: , Rfl:  .  nicotine  (NICODERM CQ ) 14 mg/24 hr patch, Place 14 mg on the skin., Disp: , Rfl:  .  ondansetron  (ZOFRAN -ODT) 8 mg disintegrating tablet, Take 8 mg by mouth., Disp: , Rfl:  .  pantoprazole  (PROTONIX ) 40 mg EC tablet, Take 1 tablet by mouth daily., Disp: , Rfl:  .  rosuvastatin  (CRESTOR ) 20 mg tablet, Take 1 tablet by mouth Once Daily., Disp: , Rfl:  .  sildenafiL  (VIAGRA ) 100 mg tablet, 1/2 TABLET TO 1 TABLET AS NEEDED ORALLY 30 DAYS, Disp: , Rfl:  .  tamsulosin  (FLOMAX ) 0.4 mg cap, Take 1 capsule  by mouth Once Daily., Disp: , Rfl:  .  testosterone  20.25 mg/1.25 gram (1.62 %) glpm, , Disp: , Rfl:

## 2024-09-20 ENCOUNTER — Ambulatory Visit: Payer: Self-pay | Admitting: Internal Medicine

## 2024-09-20 DIAGNOSIS — E7800 Pure hypercholesterolemia, unspecified: Secondary | ICD-10-CM

## 2024-09-20 LAB — HEPATIC FUNCTION PANEL
ALT: 19 IU/L (ref 0–44)
AST: 21 IU/L (ref 0–40)
Albumin: 4.2 g/dL (ref 3.8–4.8)
Alkaline Phosphatase: 56 IU/L (ref 47–123)
Bilirubin Total: 0.3 mg/dL (ref 0.0–1.2)
Bilirubin, Direct: 0.13 mg/dL (ref 0.00–0.40)
Total Protein: 6.2 g/dL (ref 6.0–8.5)

## 2024-09-20 LAB — LIPID PANEL
Chol/HDL Ratio: 2.3 ratio (ref 0.0–5.0)
Cholesterol, Total: 192 mg/dL (ref 100–199)
HDL: 85 mg/dL (ref >39)
LDL Chol Calc (NIH): 94 mg/dL (ref 0–99)
Triglycerides: 74 mg/dL (ref 0–149)
VLDL Cholesterol Cal: 13 mg/dL (ref 5–40)

## 2024-09-20 LAB — C-REACTIVE PROTEIN: CRP: <1 mg/L (ref 0–10)

## 2024-09-22 MED ORDER — EZETIMIBE 10 MG PO TABS: 10.0000 mg | ORAL_TABLET | Freq: Every day | ORAL | 3 refills | Status: DC

## 2024-12-19 ENCOUNTER — Other Ambulatory Visit: Payer: Self-pay | Admitting: Internal Medicine

## 2025-03-24 ENCOUNTER — Encounter: Admitting: Nurse Practitioner
